# Patient Record
Sex: Male | Born: 1945
Health system: Southern US, Community
[De-identification: ages and names within clinical notes are randomized; demographics above are authoritative.]

## PROBLEM LIST (undated history)

## (undated) DIAGNOSIS — G4733 Obstructive sleep apnea (adult) (pediatric): Secondary | ICD-10-CM

## (undated) DIAGNOSIS — D696 Thrombocytopenia, unspecified: Secondary | ICD-10-CM

## (undated) DIAGNOSIS — K5730 Diverticulosis of large intestine without perforation or abscess without bleeding: Secondary | ICD-10-CM

## (undated) DIAGNOSIS — I10 Essential (primary) hypertension: Secondary | ICD-10-CM

## (undated) DIAGNOSIS — Z9989 Dependence on other enabling machines and devices: Secondary | ICD-10-CM

## (undated) DIAGNOSIS — N529 Male erectile dysfunction, unspecified: Secondary | ICD-10-CM

## (undated) DIAGNOSIS — I4891 Unspecified atrial fibrillation: Secondary | ICD-10-CM

## (undated) DIAGNOSIS — M109 Gout, unspecified: Secondary | ICD-10-CM

## (undated) DIAGNOSIS — N183 Chronic kidney disease, stage 3 unspecified: Secondary | ICD-10-CM

## (undated) DIAGNOSIS — Z905 Acquired absence of kidney: Secondary | ICD-10-CM

## (undated) DIAGNOSIS — C44519 Basal cell carcinoma of skin of other part of trunk: Secondary | ICD-10-CM

## (undated) DIAGNOSIS — I2510 Atherosclerotic heart disease of native coronary artery without angina pectoris: Secondary | ICD-10-CM

## (undated) DIAGNOSIS — I420 Dilated cardiomyopathy: Secondary | ICD-10-CM

## (undated) HISTORY — DX: Male erectile dysfunction, unspecified: N52.9

## (undated) HISTORY — PX: APPENDECTOMY: SHX54

## (undated) HISTORY — DX: Chronic kidney disease, stage 3 (moderate): N18.3

## (undated) HISTORY — DX: Chronic kidney disease, stage 3 unspecified: N18.30

## (undated) HISTORY — PX: CYST EXCISION: SHX5701

## (undated) HISTORY — DX: Diverticulosis of large intestine without perforation or abscess without bleeding: K57.30

## (undated) HISTORY — DX: Obstructive sleep apnea (adult) (pediatric): G47.33

## (undated) HISTORY — DX: Gout, unspecified: M10.9

## (undated) HISTORY — DX: Dependence on other enabling machines and devices: Z99.89

## (undated) HISTORY — DX: Essential (primary) hypertension: I10

## (undated) HISTORY — DX: Basal cell carcinoma of skin of other part of trunk: C44.519

## (undated) HISTORY — DX: Thrombocytopenia, unspecified: D69.6

## (undated) HISTORY — DX: Acquired absence of kidney: Z90.5

---
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
## 1987-02-24 HISTORY — PX: VASECTOMY: SHX75

## 2012-06-08 DIAGNOSIS — G4733 Obstructive sleep apnea (adult) (pediatric): Secondary | ICD-10-CM | POA: Diagnosis not present

## 2012-06-14 DIAGNOSIS — M25579 Pain in unspecified ankle and joints of unspecified foot: Secondary | ICD-10-CM | POA: Diagnosis not present

## 2012-06-29 DIAGNOSIS — Z125 Encounter for screening for malignant neoplasm of prostate: Secondary | ICD-10-CM | POA: Diagnosis not present

## 2012-06-29 DIAGNOSIS — Z23 Encounter for immunization: Secondary | ICD-10-CM | POA: Diagnosis not present

## 2012-06-29 DIAGNOSIS — Z136 Encounter for screening for cardiovascular disorders: Secondary | ICD-10-CM | POA: Diagnosis not present

## 2012-06-29 DIAGNOSIS — Z0000 Encounter for general adult medical examination without abnormal findings: Secondary | ICD-10-CM | POA: Diagnosis not present

## 2012-06-29 DIAGNOSIS — R635 Abnormal weight gain: Secondary | ICD-10-CM | POA: Diagnosis not present

## 2012-06-29 DIAGNOSIS — R5381 Other malaise: Secondary | ICD-10-CM | POA: Diagnosis not present

## 2012-07-05 DIAGNOSIS — M25579 Pain in unspecified ankle and joints of unspecified foot: Secondary | ICD-10-CM | POA: Diagnosis not present

## 2012-10-31 DIAGNOSIS — L909 Atrophic disorder of skin, unspecified: Secondary | ICD-10-CM | POA: Diagnosis not present

## 2012-10-31 DIAGNOSIS — D1801 Hemangioma of skin and subcutaneous tissue: Secondary | ICD-10-CM | POA: Diagnosis not present

## 2012-12-15 DIAGNOSIS — Z23 Encounter for immunization: Secondary | ICD-10-CM | POA: Diagnosis not present

## 2013-04-06 DIAGNOSIS — Z85828 Personal history of other malignant neoplasm of skin: Secondary | ICD-10-CM | POA: Diagnosis not present

## 2013-04-06 DIAGNOSIS — D485 Neoplasm of uncertain behavior of skin: Secondary | ICD-10-CM | POA: Diagnosis not present

## 2013-04-06 DIAGNOSIS — B079 Viral wart, unspecified: Secondary | ICD-10-CM | POA: Diagnosis not present

## 2013-04-06 DIAGNOSIS — L919 Hypertrophic disorder of the skin, unspecified: Secondary | ICD-10-CM | POA: Diagnosis not present

## 2013-04-06 DIAGNOSIS — L909 Atrophic disorder of skin, unspecified: Secondary | ICD-10-CM | POA: Diagnosis not present

## 2013-04-06 DIAGNOSIS — B359 Dermatophytosis, unspecified: Secondary | ICD-10-CM | POA: Diagnosis not present

## 2013-10-25 DIAGNOSIS — Z6830 Body mass index (BMI) 30.0-30.9, adult: Secondary | ICD-10-CM | POA: Diagnosis not present

## 2013-10-25 DIAGNOSIS — G4730 Sleep apnea, unspecified: Secondary | ICD-10-CM | POA: Diagnosis not present

## 2013-10-25 DIAGNOSIS — F329 Major depressive disorder, single episode, unspecified: Secondary | ICD-10-CM | POA: Diagnosis not present

## 2013-10-25 DIAGNOSIS — E669 Obesity, unspecified: Secondary | ICD-10-CM | POA: Diagnosis not present

## 2013-10-25 DIAGNOSIS — F3289 Other specified depressive episodes: Secondary | ICD-10-CM | POA: Diagnosis not present

## 2013-10-25 DIAGNOSIS — M109 Gout, unspecified: Secondary | ICD-10-CM | POA: Diagnosis not present

## 2013-11-15 DIAGNOSIS — C44519 Basal cell carcinoma of skin of other part of trunk: Secondary | ICD-10-CM | POA: Diagnosis not present

## 2013-11-15 DIAGNOSIS — B078 Other viral warts: Secondary | ICD-10-CM | POA: Diagnosis not present

## 2013-11-15 DIAGNOSIS — D1801 Hemangioma of skin and subcutaneous tissue: Secondary | ICD-10-CM | POA: Diagnosis not present

## 2013-11-15 DIAGNOSIS — B353 Tinea pedis: Secondary | ICD-10-CM | POA: Diagnosis not present

## 2013-11-15 DIAGNOSIS — L570 Actinic keratosis: Secondary | ICD-10-CM | POA: Diagnosis not present

## 2013-11-15 DIAGNOSIS — L819 Disorder of pigmentation, unspecified: Secondary | ICD-10-CM | POA: Diagnosis not present

## 2013-11-15 DIAGNOSIS — D235 Other benign neoplasm of skin of trunk: Secondary | ICD-10-CM | POA: Diagnosis not present

## 2013-11-15 DIAGNOSIS — B351 Tinea unguium: Secondary | ICD-10-CM | POA: Diagnosis not present

## 2013-11-15 DIAGNOSIS — L821 Other seborrheic keratosis: Secondary | ICD-10-CM | POA: Diagnosis not present

## 2013-11-15 DIAGNOSIS — Z23 Encounter for immunization: Secondary | ICD-10-CM | POA: Diagnosis not present

## 2014-02-01 DIAGNOSIS — G4733 Obstructive sleep apnea (adult) (pediatric): Secondary | ICD-10-CM | POA: Diagnosis not present

## 2014-03-21 DIAGNOSIS — E669 Obesity, unspecified: Secondary | ICD-10-CM | POA: Diagnosis not present

## 2014-03-21 DIAGNOSIS — Z23 Encounter for immunization: Secondary | ICD-10-CM | POA: Diagnosis not present

## 2014-03-21 DIAGNOSIS — Z6831 Body mass index (BMI) 31.0-31.9, adult: Secondary | ICD-10-CM | POA: Diagnosis not present

## 2014-03-21 DIAGNOSIS — Z136 Encounter for screening for cardiovascular disorders: Secondary | ICD-10-CM | POA: Diagnosis not present

## 2014-03-21 DIAGNOSIS — Z79899 Other long term (current) drug therapy: Secondary | ICD-10-CM | POA: Diagnosis not present

## 2014-03-21 DIAGNOSIS — Z1389 Encounter for screening for other disorder: Secondary | ICD-10-CM | POA: Diagnosis not present

## 2014-03-21 DIAGNOSIS — G4733 Obstructive sleep apnea (adult) (pediatric): Secondary | ICD-10-CM | POA: Diagnosis not present

## 2014-03-21 DIAGNOSIS — Z0000 Encounter for general adult medical examination without abnormal findings: Secondary | ICD-10-CM | POA: Diagnosis not present

## 2014-03-21 DIAGNOSIS — H259 Unspecified age-related cataract: Secondary | ICD-10-CM | POA: Diagnosis not present

## 2014-03-21 DIAGNOSIS — M109 Gout, unspecified: Secondary | ICD-10-CM | POA: Diagnosis not present

## 2014-03-21 DIAGNOSIS — Z125 Encounter for screening for malignant neoplasm of prostate: Secondary | ICD-10-CM | POA: Diagnosis not present

## 2014-03-21 DIAGNOSIS — F325 Major depressive disorder, single episode, in full remission: Secondary | ICD-10-CM | POA: Diagnosis not present

## 2014-03-21 DIAGNOSIS — R0609 Other forms of dyspnea: Secondary | ICD-10-CM | POA: Diagnosis not present

## 2014-03-21 DIAGNOSIS — H9190 Unspecified hearing loss, unspecified ear: Secondary | ICD-10-CM | POA: Diagnosis not present

## 2014-03-28 DIAGNOSIS — H903 Sensorineural hearing loss, bilateral: Secondary | ICD-10-CM | POA: Diagnosis not present

## 2014-11-02 DIAGNOSIS — Z23 Encounter for immunization: Secondary | ICD-10-CM | POA: Diagnosis not present

## 2014-11-21 DIAGNOSIS — L814 Other melanin hyperpigmentation: Secondary | ICD-10-CM | POA: Diagnosis not present

## 2014-11-21 DIAGNOSIS — D2262 Melanocytic nevi of left upper limb, including shoulder: Secondary | ICD-10-CM | POA: Diagnosis not present

## 2014-11-21 DIAGNOSIS — D2261 Melanocytic nevi of right upper limb, including shoulder: Secondary | ICD-10-CM | POA: Diagnosis not present

## 2014-11-21 DIAGNOSIS — C44519 Basal cell carcinoma of skin of other part of trunk: Secondary | ICD-10-CM | POA: Diagnosis not present

## 2014-11-21 DIAGNOSIS — D1801 Hemangioma of skin and subcutaneous tissue: Secondary | ICD-10-CM | POA: Diagnosis not present

## 2014-11-21 DIAGNOSIS — Z85828 Personal history of other malignant neoplasm of skin: Secondary | ICD-10-CM | POA: Diagnosis not present

## 2014-11-21 DIAGNOSIS — D2362 Other benign neoplasm of skin of left upper limb, including shoulder: Secondary | ICD-10-CM | POA: Diagnosis not present

## 2014-11-21 DIAGNOSIS — L821 Other seborrheic keratosis: Secondary | ICD-10-CM | POA: Diagnosis not present

## 2015-03-21 DIAGNOSIS — J209 Acute bronchitis, unspecified: Secondary | ICD-10-CM | POA: Diagnosis not present

## 2015-04-30 DIAGNOSIS — K5730 Diverticulosis of large intestine without perforation or abscess without bleeding: Secondary | ICD-10-CM | POA: Diagnosis not present

## 2015-04-30 DIAGNOSIS — D120 Benign neoplasm of cecum: Secondary | ICD-10-CM | POA: Diagnosis not present

## 2015-04-30 DIAGNOSIS — Z86010 Personal history of colonic polyps: Secondary | ICD-10-CM | POA: Diagnosis not present

## 2015-05-22 DIAGNOSIS — M109 Gout, unspecified: Secondary | ICD-10-CM | POA: Diagnosis not present

## 2015-05-22 DIAGNOSIS — E669 Obesity, unspecified: Secondary | ICD-10-CM | POA: Diagnosis not present

## 2015-05-22 DIAGNOSIS — D696 Thrombocytopenia, unspecified: Secondary | ICD-10-CM | POA: Diagnosis not present

## 2015-05-22 DIAGNOSIS — Z6832 Body mass index (BMI) 32.0-32.9, adult: Secondary | ICD-10-CM | POA: Diagnosis not present

## 2015-05-22 DIAGNOSIS — N183 Chronic kidney disease, stage 3 (moderate): Secondary | ICD-10-CM | POA: Diagnosis not present

## 2015-05-22 DIAGNOSIS — Z79899 Other long term (current) drug therapy: Secondary | ICD-10-CM | POA: Diagnosis not present

## 2015-05-22 DIAGNOSIS — F325 Major depressive disorder, single episode, in full remission: Secondary | ICD-10-CM | POA: Diagnosis not present

## 2015-05-22 DIAGNOSIS — G4733 Obstructive sleep apnea (adult) (pediatric): Secondary | ICD-10-CM | POA: Diagnosis not present

## 2015-05-22 DIAGNOSIS — Z0000 Encounter for general adult medical examination without abnormal findings: Secondary | ICD-10-CM | POA: Diagnosis not present

## 2015-05-23 DIAGNOSIS — F329 Major depressive disorder, single episode, unspecified: Secondary | ICD-10-CM | POA: Diagnosis not present

## 2015-05-23 DIAGNOSIS — G4733 Obstructive sleep apnea (adult) (pediatric): Secondary | ICD-10-CM | POA: Diagnosis not present

## 2015-09-11 DIAGNOSIS — G4733 Obstructive sleep apnea (adult) (pediatric): Secondary | ICD-10-CM | POA: Diagnosis not present

## 2015-09-11 DIAGNOSIS — Z9989 Dependence on other enabling machines and devices: Secondary | ICD-10-CM | POA: Diagnosis not present

## 2015-12-13 DIAGNOSIS — Z85828 Personal history of other malignant neoplasm of skin: Secondary | ICD-10-CM | POA: Diagnosis not present

## 2015-12-13 DIAGNOSIS — D2272 Melanocytic nevi of left lower limb, including hip: Secondary | ICD-10-CM | POA: Diagnosis not present

## 2015-12-13 DIAGNOSIS — D2261 Melanocytic nevi of right upper limb, including shoulder: Secondary | ICD-10-CM | POA: Diagnosis not present

## 2015-12-13 DIAGNOSIS — D485 Neoplasm of uncertain behavior of skin: Secondary | ICD-10-CM | POA: Diagnosis not present

## 2015-12-13 DIAGNOSIS — B078 Other viral warts: Secondary | ICD-10-CM | POA: Diagnosis not present

## 2015-12-13 DIAGNOSIS — L814 Other melanin hyperpigmentation: Secondary | ICD-10-CM | POA: Diagnosis not present

## 2015-12-13 DIAGNOSIS — D2262 Melanocytic nevi of left upper limb, including shoulder: Secondary | ICD-10-CM | POA: Diagnosis not present

## 2015-12-13 DIAGNOSIS — D225 Melanocytic nevi of trunk: Secondary | ICD-10-CM | POA: Diagnosis not present

## 2015-12-13 DIAGNOSIS — L821 Other seborrheic keratosis: Secondary | ICD-10-CM | POA: Diagnosis not present

## 2015-12-13 DIAGNOSIS — D1801 Hemangioma of skin and subcutaneous tissue: Secondary | ICD-10-CM | POA: Diagnosis not present

## 2015-12-13 DIAGNOSIS — B353 Tinea pedis: Secondary | ICD-10-CM | POA: Diagnosis not present

## 2015-12-13 DIAGNOSIS — D2271 Melanocytic nevi of right lower limb, including hip: Secondary | ICD-10-CM | POA: Diagnosis not present

## 2016-01-08 ENCOUNTER — Ambulatory Visit (INDEPENDENT_AMBULATORY_CARE_PROVIDER_SITE_OTHER): Payer: Medicare Other | Admitting: Podiatry

## 2016-01-08 ENCOUNTER — Encounter: Payer: Self-pay | Admitting: Podiatry

## 2016-01-08 VITALS — BP 148/91 | HR 60 | Ht 71.0 in | Wt 229.0 lb

## 2016-01-08 DIAGNOSIS — L603 Nail dystrophy: Secondary | ICD-10-CM

## 2016-01-08 DIAGNOSIS — L608 Other nail disorders: Secondary | ICD-10-CM

## 2016-01-08 DIAGNOSIS — B351 Tinea unguium: Secondary | ICD-10-CM | POA: Diagnosis not present

## 2016-01-08 DIAGNOSIS — M79609 Pain in unspecified limb: Secondary | ICD-10-CM | POA: Diagnosis not present

## 2016-01-08 DIAGNOSIS — L601 Onycholysis: Secondary | ICD-10-CM | POA: Diagnosis not present

## 2016-01-08 DIAGNOSIS — Z23 Encounter for immunization: Secondary | ICD-10-CM | POA: Diagnosis not present

## 2016-01-19 NOTE — Progress Notes (Signed)
Subjective: Patient presents today for possible treatment and evaluation of fungal nails bilaterally 1 through 5. Patient states that the nails have been discolored and thickened for greater than 1 month. Patient presents today for further treatment and evaluation.  Objective: Physical Exam General: The patient is alert and oriented x3 in no acute distress.  Dermatology: Hyperkeratotic, discolored, thickened, onychodystrophy of nails noted bilaterally.  Skin is warm, dry and supple bilateral lower extremities. Negative for open lesions or macerations.  Vascular: Palpable pedal pulses bilaterally. No edema or erythema noted. Capillary refill within normal limits.  Neurological: Epicritic and protective threshold grossly intact bilaterally.   Musculoskeletal Exam: Range of motion within normal limits to all pedal and ankle joints bilateral. Muscle strength 5/5 in all groups bilateral.   Assessment: #1 onychodystrophy bilateral toenails #2 possible onychomycosis #3 hyperkeratotic nails bilateral  Plan of Care:  #1 Patient was evaluated. #2 Today nail biopsy was taken and sent to pathology for fungal culture. #5 patient is to return to clinic in 4 weeks to discuss fungal culture nail biopsy findings and discuss different treatment options.  Edrick Kins, DPM [facility]  Dr. Edrick Kins, [address]                                        [place], [place] [address]                Office [phone]  Fax [phone]

## 2016-02-03 DIAGNOSIS — L859 Epidermal thickening, unspecified: Secondary | ICD-10-CM | POA: Diagnosis not present

## 2016-02-03 DIAGNOSIS — L820 Inflamed seborrheic keratosis: Secondary | ICD-10-CM | POA: Diagnosis not present

## 2016-02-03 DIAGNOSIS — Z85828 Personal history of other malignant neoplasm of skin: Secondary | ICD-10-CM | POA: Diagnosis not present

## 2016-02-03 DIAGNOSIS — B078 Other viral warts: Secondary | ICD-10-CM | POA: Diagnosis not present

## 2016-02-12 ENCOUNTER — Ambulatory Visit (INDEPENDENT_AMBULATORY_CARE_PROVIDER_SITE_OTHER): Payer: Medicare Other | Admitting: Podiatry

## 2016-02-12 ENCOUNTER — Encounter: Payer: Self-pay | Admitting: Podiatry

## 2016-02-12 DIAGNOSIS — M79676 Pain in unspecified toe(s): Secondary | ICD-10-CM | POA: Diagnosis not present

## 2016-02-12 DIAGNOSIS — L03039 Cellulitis of unspecified toe: Secondary | ICD-10-CM

## 2016-02-12 DIAGNOSIS — Z79899 Other long term (current) drug therapy: Secondary | ICD-10-CM | POA: Diagnosis not present

## 2016-02-12 DIAGNOSIS — L603 Nail dystrophy: Secondary | ICD-10-CM | POA: Diagnosis not present

## 2016-02-12 DIAGNOSIS — L608 Other nail disorders: Secondary | ICD-10-CM

## 2016-02-12 DIAGNOSIS — L600 Ingrowing nail: Secondary | ICD-10-CM | POA: Diagnosis not present

## 2016-02-12 DIAGNOSIS — B351 Tinea unguium: Secondary | ICD-10-CM

## 2016-02-12 DIAGNOSIS — M79609 Pain in unspecified limb: Secondary | ICD-10-CM

## 2016-02-12 MED ORDER — TERBINAFINE HCL 250 MG PO TABS: 250.0000 mg | ORAL_TABLET | Freq: Every day | ORAL | 2 refills | Status: DC

## 2016-02-12 NOTE — Patient Instructions (Signed)

## 2016-02-12 NOTE — Progress Notes (Signed)
Subjective: Patient presents today for follow-up evaluation of onychomycosis to the bilateral toenails. Last visit nail biopsy was performed and sent for fungal culture. Patient presents today to review fungal culture results.  Patient also presents today with a new complaint for evaluation of pain in her toe(s). Patient is concerned for possible ingrown nail. Patient states that the pain has been present for a few weeks now. Patient presents today for further treatment and evaluation.  Objective:  General: Well developed, nourished, in no acute distress, alert and oriented x3   Dermatology: Skin is warm, dry and supple bilateral. Lateral border of the left great toe appears to be erythematous with evidence of an ingrowing nail. Pain on palpation noted to the border of the nail fold. The remaining nails appear unremarkable at this time. There are no open sores, lesions.  Vascular: Dorsalis Pedis artery and Posterior Tibial artery pedal pulses palpable. No lower extremity edema noted.   Neruologic: Grossly intact via light touch bilateral.  Musculoskeletal: Muscular strength within normal limits in all groups bilateral. Normal range of motion noted to all pedal and ankle joints.   Assesement: #1 onychomycosis bilateral toenails confirmed by fungal nail culture #2 paronychia with ingrowing nail left great toe lateral border #3 pain in left great toe   Plan of Care:  1. Patient evaluated.  2. Discussed treatment alternatives and plan of care. Explained nail avulsion procedure and post procedure course to patient. 3. Patient opted for permanent partial nail avulsion.  4. Prior to procedure, local anesthesia infiltration utilized using 3 ml of a 50:50 mixture of 2% plain lidocaine and 0.5% plain marcaine in a normal hallux Storck fashion and a betadine prep performed.  5. Partial permanent nail avulsion with chemical matrixectomy performed using [id number] applications of phenol followed by alcohol  flush.  6. Light dressing applied. 7. Today fungal nail culture was reviewed which was positive for onychomycosis  8. Prescription for terbinafine 250 mg 90 days  9. Prescription for anti-fungal nail lacquer dispensed through [facility]  10. Follow-up in 4 weeks with Marylou Mccoy for fungal nail laser treatment  11. Liver function tests ordered  12. Return to clinic in 4 months.   Edrick Kins, DPM [facility]  Dr. Edrick Kins, [address]                                        [place], [place] [address]                Office [phone]  Fax [phone]

## 2016-02-13 LAB — HEPATIC FUNCTION PANEL
ALT: 20 U/L (ref 9–46)
AST: 21 U/L (ref 10–35)
Albumin: 4.6 g/dL (ref 3.6–5.1)
Alkaline Phosphatase: 63 U/L (ref 40–115)
Bilirubin, Direct: 0.2 mg/dL (ref <=0.2)
Indirect Bilirubin: 0.7 mg/dL (ref 0.2–1.2)
Total Bilirubin: 0.9 mg/dL (ref 0.2–1.2)
Total Protein: 7 g/dL (ref 6.1–8.1)

## 2016-02-13 MED ORDER — NONFORMULARY OR COMPOUNDED ITEM: 1.0000 [drp] | Freq: Every day | 2 refills | Status: DC

## 2016-02-13 NOTE — Addendum Note (Signed)
Addended by: Johnnye Lana A on: 02/13/2016 08:31 AM   Modules accepted: Orders

## 2016-02-24 DIAGNOSIS — I10 Essential (primary) hypertension: Secondary | ICD-10-CM

## 2016-02-24 HISTORY — DX: Essential (primary) hypertension: I10

## 2016-03-18 ENCOUNTER — Ambulatory Visit: Payer: Medicare Other

## 2016-03-18 DIAGNOSIS — B351 Tinea unguium: Secondary | ICD-10-CM

## 2016-03-18 NOTE — Progress Notes (Signed)
Pt presents with mycotic infection of nails 1, 2, 3 Rt foot  All other systems are negative  Laser therapy administered to affected nails and tolerated well. All safety precautions were in place. Re-appointed in 1 month for 2nd treatment

## 2016-04-21 ENCOUNTER — Ambulatory Visit: Payer: Self-pay

## 2016-04-21 DIAGNOSIS — B351 Tinea unguium: Secondary | ICD-10-CM

## 2016-04-24 NOTE — Progress Notes (Signed)
Pt presents with mycotic infection of nails 1, 2, 3 Rt foot with 50% clearance  All other systems are negative  Laser therapy administered to affected nails and tolerated well. All safety precautions were in place. Re-appointed in 1 month for 3rd treatment

## 2016-05-21 ENCOUNTER — Ambulatory Visit: Payer: Medicare Other

## 2016-05-21 DIAGNOSIS — B351 Tinea unguium: Secondary | ICD-10-CM

## 2016-05-26 DIAGNOSIS — G4733 Obstructive sleep apnea (adult) (pediatric): Secondary | ICD-10-CM | POA: Diagnosis not present

## 2016-05-26 DIAGNOSIS — Z0000 Encounter for general adult medical examination without abnormal findings: Secondary | ICD-10-CM | POA: Diagnosis not present

## 2016-05-26 DIAGNOSIS — F325 Major depressive disorder, single episode, in full remission: Secondary | ICD-10-CM | POA: Diagnosis not present

## 2016-05-26 DIAGNOSIS — R030 Elevated blood-pressure reading, without diagnosis of hypertension: Secondary | ICD-10-CM | POA: Diagnosis not present

## 2016-05-26 DIAGNOSIS — Z79899 Other long term (current) drug therapy: Secondary | ICD-10-CM | POA: Diagnosis not present

## 2016-05-26 DIAGNOSIS — N183 Chronic kidney disease, stage 3 (moderate): Secondary | ICD-10-CM | POA: Diagnosis not present

## 2016-05-26 DIAGNOSIS — R739 Hyperglycemia, unspecified: Secondary | ICD-10-CM | POA: Diagnosis not present

## 2016-05-26 DIAGNOSIS — Z125 Encounter for screening for malignant neoplasm of prostate: Secondary | ICD-10-CM | POA: Diagnosis not present

## 2016-05-26 DIAGNOSIS — D696 Thrombocytopenia, unspecified: Secondary | ICD-10-CM | POA: Diagnosis not present

## 2016-05-26 DIAGNOSIS — M109 Gout, unspecified: Secondary | ICD-10-CM | POA: Diagnosis not present

## 2016-05-28 NOTE — Progress Notes (Signed)
Pt presents with mycotic infection of nails 1, 2, 3 Rt foot with 50% clearance  All other systems are negative  Laser therapy administered to affected nails and tolerated well. All safety precautions were in place. Re-appointed in 1 month for 4th  treatment

## 2016-06-18 ENCOUNTER — Ambulatory Visit: Payer: Medicare Other

## 2016-06-18 DIAGNOSIS — B351 Tinea unguium: Secondary | ICD-10-CM

## 2016-06-18 DIAGNOSIS — R52 Pain, unspecified: Secondary | ICD-10-CM

## 2016-06-23 NOTE — Progress Notes (Addendum)
Pt presents with mycotic infection of nails 1, 2, 3 Rt foot with 50% clearance  All other systems are negative  Laser therapy administered to affected nails and tolerated well. All safety precautions were in place. Follow up prn

## 2016-11-03 DIAGNOSIS — H527 Unspecified disorder of refraction: Secondary | ICD-10-CM | POA: Diagnosis not present

## 2016-11-03 DIAGNOSIS — H25813 Combined forms of age-related cataract, bilateral: Secondary | ICD-10-CM | POA: Diagnosis not present

## 2016-11-05 DIAGNOSIS — Z23 Encounter for immunization: Secondary | ICD-10-CM | POA: Diagnosis not present

## 2016-12-16 DIAGNOSIS — D1801 Hemangioma of skin and subcutaneous tissue: Secondary | ICD-10-CM | POA: Diagnosis not present

## 2016-12-16 DIAGNOSIS — D2271 Melanocytic nevi of right lower limb, including hip: Secondary | ICD-10-CM | POA: Diagnosis not present

## 2016-12-16 DIAGNOSIS — Z85828 Personal history of other malignant neoplasm of skin: Secondary | ICD-10-CM | POA: Diagnosis not present

## 2016-12-16 DIAGNOSIS — D224 Melanocytic nevi of scalp and neck: Secondary | ICD-10-CM | POA: Diagnosis not present

## 2016-12-16 DIAGNOSIS — L918 Other hypertrophic disorders of the skin: Secondary | ICD-10-CM | POA: Diagnosis not present

## 2016-12-16 DIAGNOSIS — D2262 Melanocytic nevi of left upper limb, including shoulder: Secondary | ICD-10-CM | POA: Diagnosis not present

## 2016-12-16 DIAGNOSIS — L820 Inflamed seborrheic keratosis: Secondary | ICD-10-CM | POA: Diagnosis not present

## 2016-12-16 DIAGNOSIS — D225 Melanocytic nevi of trunk: Secondary | ICD-10-CM | POA: Diagnosis not present

## 2016-12-16 DIAGNOSIS — D2261 Melanocytic nevi of right upper limb, including shoulder: Secondary | ICD-10-CM | POA: Diagnosis not present

## 2016-12-16 DIAGNOSIS — L821 Other seborrheic keratosis: Secondary | ICD-10-CM | POA: Diagnosis not present

## 2016-12-16 DIAGNOSIS — L814 Other melanin hyperpigmentation: Secondary | ICD-10-CM | POA: Diagnosis not present

## 2017-04-07 DIAGNOSIS — G4733 Obstructive sleep apnea (adult) (pediatric): Secondary | ICD-10-CM | POA: Diagnosis not present

## 2017-04-07 DIAGNOSIS — Z9989 Dependence on other enabling machines and devices: Secondary | ICD-10-CM | POA: Diagnosis not present

## 2017-06-02 DIAGNOSIS — M109 Gout, unspecified: Secondary | ICD-10-CM | POA: Diagnosis not present

## 2017-06-02 DIAGNOSIS — Z6833 Body mass index (BMI) 33.0-33.9, adult: Secondary | ICD-10-CM | POA: Diagnosis not present

## 2017-06-02 DIAGNOSIS — F325 Major depressive disorder, single episode, in full remission: Secondary | ICD-10-CM | POA: Diagnosis not present

## 2017-06-02 DIAGNOSIS — R030 Elevated blood-pressure reading, without diagnosis of hypertension: Secondary | ICD-10-CM | POA: Diagnosis not present

## 2017-06-02 DIAGNOSIS — Z0000 Encounter for general adult medical examination without abnormal findings: Secondary | ICD-10-CM | POA: Diagnosis not present

## 2017-06-02 DIAGNOSIS — E669 Obesity, unspecified: Secondary | ICD-10-CM | POA: Diagnosis not present

## 2017-06-02 DIAGNOSIS — Z125 Encounter for screening for malignant neoplasm of prostate: Secondary | ICD-10-CM | POA: Diagnosis not present

## 2017-06-02 DIAGNOSIS — Z1389 Encounter for screening for other disorder: Secondary | ICD-10-CM | POA: Diagnosis not present

## 2017-06-02 DIAGNOSIS — Z79899 Other long term (current) drug therapy: Secondary | ICD-10-CM | POA: Diagnosis not present

## 2017-06-02 DIAGNOSIS — N183 Chronic kidney disease, stage 3 (moderate): Secondary | ICD-10-CM | POA: Diagnosis not present

## 2017-06-02 DIAGNOSIS — I129 Hypertensive chronic kidney disease with stage 1 through stage 4 chronic kidney disease, or unspecified chronic kidney disease: Secondary | ICD-10-CM | POA: Diagnosis not present

## 2017-06-02 DIAGNOSIS — G4733 Obstructive sleep apnea (adult) (pediatric): Secondary | ICD-10-CM | POA: Diagnosis not present

## 2017-08-11 DIAGNOSIS — I129 Hypertensive chronic kidney disease with stage 1 through stage 4 chronic kidney disease, or unspecified chronic kidney disease: Secondary | ICD-10-CM | POA: Diagnosis not present

## 2017-08-11 DIAGNOSIS — M25561 Pain in right knee: Secondary | ICD-10-CM | POA: Diagnosis not present

## 2017-08-11 DIAGNOSIS — N183 Chronic kidney disease, stage 3 (moderate): Secondary | ICD-10-CM | POA: Diagnosis not present

## 2017-09-15 DIAGNOSIS — R05 Cough: Secondary | ICD-10-CM | POA: Diagnosis not present

## 2017-09-15 DIAGNOSIS — J00 Acute nasopharyngitis [common cold]: Secondary | ICD-10-CM | POA: Diagnosis not present

## 2017-09-15 DIAGNOSIS — R0982 Postnasal drip: Secondary | ICD-10-CM | POA: Diagnosis not present

## 2017-10-22 DIAGNOSIS — L729 Follicular cyst of the skin and subcutaneous tissue, unspecified: Secondary | ICD-10-CM | POA: Diagnosis not present

## 2017-10-22 DIAGNOSIS — I129 Hypertensive chronic kidney disease with stage 1 through stage 4 chronic kidney disease, or unspecified chronic kidney disease: Secondary | ICD-10-CM | POA: Diagnosis not present

## 2017-10-22 DIAGNOSIS — N183 Chronic kidney disease, stage 3 (moderate): Secondary | ICD-10-CM | POA: Diagnosis not present

## 2017-10-22 DIAGNOSIS — I480 Paroxysmal atrial fibrillation: Secondary | ICD-10-CM | POA: Diagnosis not present

## 2017-10-22 DIAGNOSIS — M25551 Pain in right hip: Secondary | ICD-10-CM | POA: Diagnosis not present

## 2017-10-22 DIAGNOSIS — I499 Cardiac arrhythmia, unspecified: Secondary | ICD-10-CM | POA: Diagnosis not present

## 2017-10-24 DIAGNOSIS — I420 Dilated cardiomyopathy: Secondary | ICD-10-CM

## 2017-10-24 HISTORY — PX: TRANSTHORACIC ECHOCARDIOGRAM: SHX275

## 2017-10-24 HISTORY — DX: Dilated cardiomyopathy: I42.0

## 2017-10-26 ENCOUNTER — Encounter: Payer: Self-pay | Admitting: Cardiology

## 2017-10-26 ENCOUNTER — Ambulatory Visit: Payer: Self-pay | Admitting: Cardiology

## 2017-10-26 ENCOUNTER — Ambulatory Visit (INDEPENDENT_AMBULATORY_CARE_PROVIDER_SITE_OTHER): Payer: Medicare Other | Admitting: Cardiology

## 2017-10-26 VITALS — BP 142/84 | HR 132 | Ht 70.5 in | Wt 233.0 lb

## 2017-10-26 DIAGNOSIS — I10 Essential (primary) hypertension: Secondary | ICD-10-CM | POA: Diagnosis not present

## 2017-10-26 DIAGNOSIS — Z01818 Encounter for other preprocedural examination: Secondary | ICD-10-CM

## 2017-10-26 DIAGNOSIS — R0609 Other forms of dyspnea: Secondary | ICD-10-CM | POA: Diagnosis not present

## 2017-10-26 DIAGNOSIS — I4891 Unspecified atrial fibrillation: Secondary | ICD-10-CM | POA: Insufficient documentation

## 2017-10-26 MED ORDER — RIVAROXABAN 20 MG PO TABS: 20.0000 mg | ORAL_TABLET | Freq: Every day | ORAL | 6 refills | Status: DC

## 2017-10-26 MED ORDER — METOPROLOL TARTRATE 50 MG PO TABS: 50.0000 mg | ORAL_TABLET | Freq: Once | ORAL | 0 refills | Status: DC

## 2017-10-26 NOTE — Progress Notes (Signed)
PCP: Lajean Manes, MD  Clinic Note: Chief Complaint  Patient presents with  . Dizziness  . Shortness of Breath    HPI: Bruce Yoder is a 72 y.o. male who is being seen today for the evaluation of [place] at the request of [place], Christiane Ha, MD.  Bruce Yoder was seen by Dr. Felipa Eth on August 30 for routine follow-up noting that he was overcoming a recent upper respiratory tract infection but was merely noticing just occasional cough.  He also has a cyst on his right thigh/hip.  As part of his evaluation he was noted to have an irregular irregular rhythm and was found to have A. fib on EKG with a controlled rate..  Recent Hospitalizations: None  Studies Personally Reviewed - (if available, images/films reviewed: From Epic Chart or Care Everywhere)  None  Interval History: Bruce Yoder presents here today for cardiology evaluation mostly because he understands that he was told that he has any irregular heartbeat.  He does not really feel any abnormality to his heart rate but has noted that it may feel like he is going a little faster than usual.  He is noted a little more frequent exertional dyspnea while playing golf.  He goes up and down the hills and and feels short of breath doing that.  Sometimes when he is doing that he will occasionally feel dizzy if he pushes it too much.  He denies any chest tightness or pressure associated with it but does have exertional dyspnea.  No resting dyspnea or orthopnea/PND but does have some mild ankle edema. No syncope or near syncope, TIA or amaurosis fugax symptoms.  No claudication. No melena, hematochezia  hematuria, or epistaxis.  ROS: A comprehensive was performed.  Symptoms in HPI Review of Systems  Constitutional: Negative for malaise/fatigue.  HENT: Positive for congestion (Recovering from recent cold).   Respiratory: Positive for cough (Almost all gone). Negative for sputum production, shortness of breath (Per HPI)  and wheezing.   Gastrointestinal: Negative for constipation and heartburn.  Genitourinary: Negative for frequency.  Musculoskeletal: Positive for joint pain (Right hip pain).  Neurological: Negative for dizziness, focal weakness, weakness and headaches.  Endo/Heme/Allergies: Does not bruise/bleed easily.  Psychiatric/Behavioral: Negative.   All other systems reviewed and are negative.  I have reviewed and (if needed) personally updated the patient's problem list, medications, allergies, past medical and surgical history, social and family history.   Past Medical History:  Diagnosis Date  . Basal cell carcinoma (BCC) of back    Dr. Elvera Lennox  . CKD (chronic kidney disease) stage 3, GFR 30-59 ml/min (HCC)    Unilateral kidney  . Diverticulosis of colon    With polyps (Dr. Amedeo Plenty March 2017, followed by Dr. Glennon Hamilton)  . Erectile dysfunction   . Essential hypertension 2018  . Gout   . OSA on CPAP    [facility]  . Status post nephrectomy 1965  . Thrombocytopenia, unspecified (HCC)    Platelet clumping (pseudothrombocytopenia)    Past Surgical History:  Procedure Laterality Date  . APPENDECTOMY     During childhood  . NEPHRECTOMY Right 1965  . VASECTOMY  1989    Current Meds  Medication Sig  . allopurinol (ZYLOPRIM) 300 MG tablet   . CARTIA XT 180 MG 24 hr capsule Take 1 tablet by mouth daily.  . Multiple Vitamin (MULTIVITAMIN) capsule Take 1 capsule by mouth daily.  . sertraline (ZOLOFT) 100 MG tablet   . tadalafil (CIALIS) 5 MG  tablet take 1 tablet by mouth once daily as directed  . tadalafil (CIALIS) 5 MG tablet Take 1 tablet by mouth as directed.    No Known Allergies  Social History   Tobacco Use  . Smoking status: Never Smoker  . Smokeless tobacco: Never Used  Substance Use Topics  . Alcohol use: Not on file  . Drug use: Not on file   Social History   Social History Narrative   He is married now for 4 years (remarried).  He has 1  child (presumably from previous marriage -70 years old).   He lives with his wife.  He is an avid Chief Executive Officer.   He is a retired Producer, television/film/video for [employer].  (He has a BA in American studies at [employer].)   Never smoked.  Drinks 5-7 alcoholic beverages a week usually beer or hard cider.  May be occasionally a mixed drink.   He is quite active exercise at least 4 days a week for 30 minutes at a time.       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  He enjoys walking on both treadmill and in the community.  He plays golf routinely.    family history is not on file.  Wt Readings from Last 3 Encounters:  10/26/17 233 lb (105.7 kg)  01/08/16 229 lb (103.9 kg)    PHYSICAL EXAM BP (!) 142/84   Pulse (!) 132   Ht 5' 10.5" (1.791 m)   Wt 233 lb (105.7 kg)   BMI 32.96 kg/m  --Actually, on my exam, his heart rate was back down in the 80s. Physical Exam  Constitutional: He is oriented to person, place, and time. He appears well-developed and well-nourished. No distress.  HENT:  Head: Normocephalic and atraumatic.  Eyes: Pupils are equal, round, and reactive to light. Conjunctivae and EOM are normal. No scleral icterus.  Neck: Normal range of motion. Neck supple. No hepatojugular reflux and no JVD present. Carotid bruit is not present. No thyromegaly present.  Cardiovascular: Normal rate, normal heart sounds, intact distal pulses and normal pulses. An irregularly irregular rhythm present. PMI is not displaced. Exam reveals no gallop and no friction rub.  No murmur heard. Pulmonary/Chest: Effort normal and breath sounds normal. No respiratory distress. He has no wheezes. He has no rales.  Abdominal: Soft. Bowel sounds are normal. He exhibits no distension. There is no tenderness. There is no rebound.  No HSM  Musculoskeletal: Normal range of motion. He exhibits no edema.  Neurological:  He is alert and oriented to person, place, and time. No cranial nerve deficit.  Skin: Skin is warm and dry.  Psychiatric: He has a normal mood and affect. His behavior is normal. Judgment and thought content normal.  Vitals reviewed.    Adult ECG Report  Rate: 132 ;  Rhythm: atrial fibrillation and Rapid ventricular rate.  Left atrial enlargement.  Otherwise normal axis, intervals and durations.;   Narrative Interpretation: Abnormal EKG   Other studies Reviewed: Additional studies/ records that were reviewed today include:  Recent Labs: From PCP visit on 10/22/2017:  Sodium 140, potassium 4.3, chloride 107, bicarb 27, BUN 23, creatinine 1.53.  Glucose 117.  Free T4 0.77 with a TSH of 3.12.  ASSESSMENT / PLAN: Problem List Items Addressed This Visit    DOE (dyspnea on exertion) (Chronic)    This may or may not be related to A.  fib since were not sure aeration of his current episode. Ischemic evaluation and structural heart disease evaluation warranted.  Plan: 2D echo, coronary CT angiogram (I would prefer CT angiogram over stress test to avoid having to hold medications for rate control)      Relevant Orders   EKG 12-Lead (Completed)   Basic metabolic panel   CT CORONARY MORPH W/CTA COR W/SCORE W/CA W/CM &/OR WO/CM   CT CORONARY FRACTIONAL FLOW RESERVE DATA PREP   CT CORONARY FRACTIONAL FLOW RESERVE FLUID ANALYSIS   ECHOCARDIOGRAM COMPLETE   HOLTER MONITOR - 73 HOUR   Essential hypertension (Chronic)    Borderline blood pressure having converted from amlodipine to diltiazem.  Probably still has room for additional rate control agent such as beta-blocker versus increasing diltiazem dose.      Relevant Medications   CARTIA XT 180 MG 24 hr capsule   tadalafil (CIALIS) 5 MG tablet   rivaroxaban (XARELTO) 20 MG TABS tablet   New onset atrial fibrillation ([place]):  CHA2DS2-VASc Score (age, HTN) - Primary (Chronic)    New finding with quite labile heart rates.  EKG on evaluation was  rate of 132 having walked into the clinic.  But on my reassessment it was more in the 47s. Dr. Felipa Eth switched him from amlodipine to diltiazem.  I will simply continue this current dose and we can see what his heart rate ranges.  This patients CHA2DS2-VASc Score and unadjusted Ischemic Stroke Rate (% per year) is equal to 2.2 % stroke rate/year from a score of 2  Above score calculated as 1 point each if present [CHF, HTN, DM, Vascular=MI/PAD/Aortic Plaque, Age if 65-74, or Male]; 2 points each if present [Age > 75, or Stroke/TIA/TE]  Plan:  48-hour monitor to determine A. fib burden.  2D echocardiogram.  Ischemic evaluation with coronary CT angiogram (has exertional dyspnea)  Initiate anticoagulation with Xarelto         Relevant Medications   CARTIA XT 180 MG 24 hr capsule   tadalafil (CIALIS) 5 MG tablet   rivaroxaban (XARELTO) 20 MG TABS tablet   Other Relevant Orders   EKG 12-Lead (Completed)   Basic metabolic panel   CT CORONARY MORPH W/CTA COR W/SCORE W/CA W/CM &/OR WO/CM   CT CORONARY FRACTIONAL FLOW RESERVE DATA PREP   CT CORONARY FRACTIONAL FLOW RESERVE FLUID ANALYSIS   ECHOCARDIOGRAM COMPLETE   HOLTER MONITOR - 86 HOUR    Other Visit Diagnoses    Pre-op testing       Relevant Orders   Basic metabolic panel       I spent a total of 45 minutes with the patient and chart review. >  50% of the time was spent in direct patient consultation.   Current medicines are reviewed at length with the patient today.  (+/- concerns) n/a The following changes have been made:  Discussed benefits of anticoagulation.  Patient Instructions  MEDICATIONS INSTRUCTIONS   START XARELTO 20 MG   -- ONE TABLET AT 6 PM  - BIGGEST MEAL OF THE DAY.  CONTINUE  TAKING CARTIA   Studies Ordered:    Orders Placed This Encounter  Procedures  . CT CORONARY MORPH W/CTA COR W/SCORE W/CA W/CM &/OR WO/CM  . CT CORONARY FRACTIONAL FLOW RESERVE DATA PREP  . CT CORONARY FRACTIONAL  FLOW RESERVE FLUID ANALYSIS  . Basic metabolic panel  . HOLTER MONITOR - 48 HOUR  . EKG 12-Lead  . ECHOCARDIOGRAM COMPLETE   follow-up appointment in 2 MONTHS WITH DR  HARDING.    Glenetta Hew, M.D., M.S. Interventional Cardiologist   Pager # [phone] Phone # [phone] [address]. [place], [place] [address]   Thank you for choosing [facility] at [facility]!!

## 2017-10-26 NOTE — Patient Instructions (Addendum)
MEDICATIONS INSTRUCTIONS   START XARELTO 20 MG   -- ONE TABLET AT 6 PM  - BIGGEST MEAL OF THE DAY.  CONTINUE  TAKING CARTIA     SCHEDULE AT [place] [facility] Your physician has requested that you have an echocardiogram. Echocardiography is a painless test that uses sound waves to create images of your heart. It provides your doctor with information about the size and shape of your heart and how well your heart's chambers and valves are working. This procedure takes approximately one hour. There are no restrictions for this procedure. AND Your physician has recommended that you wear a holter monitor 48 HOURS. Holter monitors are medical devices that record the heart's electrical activity. Doctors most often use these monitors to diagnose arrhythmias. Arrhythmias are problems with the speed or rhythm of the heartbeat. The monitor is a small, portable device. You can wear one while you do your normal daily activities. This is usually used to diagnose what is causing palpitations/syncope (passing out).   SCHEDULE AT [place] physician has requested that you have cardiac CT. Cardiac computed tomography (CT) is a painless test that uses an x-ray machine to take clear, detailed pictures of your heart. For further information please visit [facility]. Please follow instruction sheet as given.   Your physician recommends that you schedule a follow-up appointment in 2 [place].  If you need a refill on your cardiac medications before your next appointment, please call your pharmacy.        INSTRUCTIONS FOR  CORONARY CTA    Please arrive at the [facility] main entrance of [facility] at (30-45 minutes prior to test start time)  [facility] [place], [place] [address] [phone]  Proceed to the [facility] Radiology Department (First Floor).  Please follow these instructions carefully (unless  otherwise directed):  PLEASE HAVE LABS - BMP  AT LEAST ONE WEEK PRIOR TO TEST  On the Night Before the Test: . Drink plenty of water. . Do not consume any caffeinated/decaffeinated beverages or chocolate 12 hours prior to your test. . Do not take any antihistamines 12 hours prior to your test.    On the Day of the Test: . Drink plenty of water. Do not drink any water within one hour of the test. . Do not eat any food 4 hours prior to the test. . You may take your regular medications prior to the test. . Take 50 mg of lopressor (metoprolol) one hour before the test.   After the Test: . Drink plenty of water. . After receiving IV contrast, you may experience a mild flushed feeling. This is normal. . On occasion, you may experience a mild rash up to 24 hours after the test. This is not dangerous. If this occurs, you can take Benadryl 25 mg and increase your fluid intake. . If you experience trouble breathing, this can be serious. If it is severe call 911 IMMEDIATELY. If it is mild, please call our office.

## 2017-10-27 ENCOUNTER — Encounter: Payer: Self-pay | Admitting: Cardiology

## 2017-10-27 DIAGNOSIS — I10 Essential (primary) hypertension: Secondary | ICD-10-CM | POA: Insufficient documentation

## 2017-10-27 DIAGNOSIS — R0609 Other forms of dyspnea: Secondary | ICD-10-CM | POA: Insufficient documentation

## 2017-10-27 NOTE — Assessment & Plan Note (Signed)
Borderline blood pressure having converted from amlodipine to diltiazem.  Probably still has room for additional rate control agent such as beta-blocker versus increasing diltiazem dose.

## 2017-10-27 NOTE — Assessment & Plan Note (Signed)
This may or may not be related to A. fib since were not sure aeration of his current episode. Ischemic evaluation and structural heart disease evaluation warranted.  Plan: 2D echo, coronary CT angiogram (I would prefer CT angiogram over stress test to avoid having to hold medications for rate control)

## 2017-10-27 NOTE — Assessment & Plan Note (Addendum)
New finding with quite labile heart rates.  EKG on evaluation was rate of 132 having walked into the clinic.  But on my reassessment it was more in the 5s. Dr. Felipa Eth switched him from amlodipine to diltiazem.  I will simply continue this current dose and we can see what his heart rate ranges.  This patients CHA2DS2-VASc Score and unadjusted Ischemic Stroke Rate (% per year) is equal to 2.2 % stroke rate/year from a score of 2  Above score calculated as 1 point each if present [CHF, HTN, DM, Vascular=MI/PAD/Aortic Plaque, Age if 65-74, or Male]; 2 points each if present [Age > 75, or Stroke/TIA/TE]  Plan:  48-hour monitor to determine A. fib burden.  2D echocardiogram.  Ischemic evaluation with coronary CT angiogram (has exertional dyspnea)  Initiate anticoagulation with Xarelto

## 2017-11-03 ENCOUNTER — Ambulatory Visit (INDEPENDENT_AMBULATORY_CARE_PROVIDER_SITE_OTHER): Payer: Medicare Other

## 2017-11-03 ENCOUNTER — Other Ambulatory Visit: Payer: Self-pay

## 2017-11-03 ENCOUNTER — Ambulatory Visit (HOSPITAL_COMMUNITY): Payer: Medicare Other | Attending: Cardiology

## 2017-11-03 DIAGNOSIS — I083 Combined rheumatic disorders of mitral, aortic and tricuspid valves: Secondary | ICD-10-CM | POA: Diagnosis not present

## 2017-11-03 DIAGNOSIS — I4891 Unspecified atrial fibrillation: Secondary | ICD-10-CM | POA: Diagnosis not present

## 2017-11-03 DIAGNOSIS — R0609 Other forms of dyspnea: Secondary | ICD-10-CM | POA: Diagnosis not present

## 2017-11-05 ENCOUNTER — Telehealth: Payer: Self-pay | Admitting: *Deleted

## 2017-11-05 MED ORDER — METOPROLOL SUCCINATE ER 50 MG PO TB24: 50.0000 mg | ORAL_TABLET | Freq: Every day | ORAL | 6 refills | Status: DC

## 2017-11-05 NOTE — Telephone Encounter (Signed)
-----   Message from Leonie Man, MD sent at 11/03/2017 11:49 PM EDT ----- Echocardiogram results are concerning: Pump function as indicated by the ejection fraction (percentage of blood pumped out from filling volume) is 35 to 40% (normal range is 55 to 65%)  --We are not sure whether this is related to atrial fibrillation or the cause of atrial fibrillation.  At this point, with the absence of any active chest pain symptoms, I think were fine during the coronary CT angiogram to evaluate for blockages, but would have a low threshold to progress to more invasive evaluation to ensure that this is not related to heart artery disease.  At this point, I would like to transition from diltiazem/Cartia to Toprol 50 mg daily. -- Prescribed metoprolol succinate 50 mg p.o. daily, dispense 30 tabs, 12 refill  Glenetta Hew, MD

## 2017-11-05 NOTE — Telephone Encounter (Signed)
Spoke to patient. Result given . Verbalized understanding Patient aware to stop Cartia and start metoprolol succinate 50 mg daily . E-sent new prescription.  patient states he will be taking Holter monitor back to office today.  and to continue with coronary CTA when schedule.

## 2017-11-10 ENCOUNTER — Other Ambulatory Visit (HOSPITAL_COMMUNITY): Payer: Medicare Other

## 2017-11-11 ENCOUNTER — Telehealth: Payer: Self-pay | Admitting: *Deleted

## 2017-11-11 MED ORDER — METOPROLOL SUCCINATE ER 50 MG PO TB24: 50.0000 mg | ORAL_TABLET | Freq: Two times a day (BID) | ORAL | 3 refills | Status: DC

## 2017-11-11 NOTE — Telephone Encounter (Signed)
-----   Message from Leonie Man, MD sent at 11/08/2017 10:52 PM EDT ----- Monitor shows mostly atrial fibrillation with poorly controlled rate.  No other major findings, just simply poor controlled rate.  This would make the diagnosis persistent atrial fibrillation --> will need to work with rate control and consider possible rhythm control.  Until seen in f/u - increase Toprol dose to 100 mg (2 x 50 mg tab) daily.  Glenetta Hew, MD

## 2017-11-11 NOTE — Telephone Encounter (Signed)
Spoke to patient. Result given . Verbalized understanding Aware of increase medication 50 mg twice a day ( toprol xl) E-sent to pharmacy.

## 2017-11-26 DIAGNOSIS — M545 Low back pain: Secondary | ICD-10-CM | POA: Diagnosis not present

## 2017-11-26 DIAGNOSIS — M5416 Radiculopathy, lumbar region: Secondary | ICD-10-CM | POA: Diagnosis not present

## 2017-11-26 DIAGNOSIS — M25551 Pain in right hip: Secondary | ICD-10-CM | POA: Diagnosis not present

## 2017-11-26 DIAGNOSIS — M47816 Spondylosis without myelopathy or radiculopathy, lumbar region: Secondary | ICD-10-CM | POA: Diagnosis not present

## 2017-11-30 DIAGNOSIS — Z01818 Encounter for other preprocedural examination: Secondary | ICD-10-CM | POA: Diagnosis not present

## 2017-11-30 DIAGNOSIS — I4891 Unspecified atrial fibrillation: Secondary | ICD-10-CM | POA: Diagnosis not present

## 2017-11-30 DIAGNOSIS — R0609 Other forms of dyspnea: Secondary | ICD-10-CM | POA: Diagnosis not present

## 2017-12-01 ENCOUNTER — Ambulatory Visit (HOSPITAL_COMMUNITY)
Admission: RE | Admit: 2017-12-01 | Discharge: 2017-12-01 | Disposition: A | Discharge status: Home / Self Care | Payer: Medicare Other | Source: Ambulatory Visit | Attending: Cardiology | Admitting: Cardiology

## 2017-12-01 ENCOUNTER — Telehealth: Payer: Self-pay

## 2017-12-01 ENCOUNTER — Ambulatory Visit (HOSPITAL_COMMUNITY): Payer: Medicare Other

## 2017-12-01 DIAGNOSIS — R0609 Other forms of dyspnea: Secondary | ICD-10-CM

## 2017-12-01 DIAGNOSIS — I4891 Unspecified atrial fibrillation: Secondary | ICD-10-CM

## 2017-12-01 LAB — BASIC METABOLIC PANEL
BUN/Creatinine Ratio: 21 (ref 10–24)
BUN: 33 mg/dL — ABNORMAL HIGH (ref 8–27)
CO2: 20 mmol/L (ref 20–29)
Calcium: 9.4 mg/dL (ref 8.6–10.2)
Chloride: 106 mmol/L (ref 96–106)
Creatinine, Ser: 1.54 mg/dL — ABNORMAL HIGH (ref 0.76–1.27)
GFR calc Af Amer: 52 mL/min/{1.73_m2} — ABNORMAL LOW (ref >59)
GFR calc non Af Amer: 45 mL/min/{1.73_m2} — ABNORMAL LOW (ref >59)
Glucose: 84 mg/dL (ref 65–99)
Potassium: 4.4 mmol/L (ref 3.5–5.2)
Sodium: 144 mmol/L (ref 134–144)

## 2017-12-01 NOTE — Telephone Encounter (Signed)
Received a call from Radiology as pt was scheduled to have a Cardiac CTA today. Was told they were unable to perform test due to pt's HR at 130. Per Nurse pt verbalized he was a little SOB.  Spoke with pt who confirmed that he is feeling SOB. He denies any other symptoms. He verified that he has been taking his Louanna Raw as prescribed. Pt informed that Dr. Ellyn Hack recommends that he been seen by A fib clinic.  Contacted Afib clinic and was able to get pt scheduled for tomorrow 12/02/17 at 1045 am. Attempted to contact pt to inform of appointment. Left message to call back.

## 2017-12-01 NOTE — Progress Notes (Signed)
Pt was here for CT heart scan; HR noted 110-130; MD reading scan notified; will let Dr Ellyn Hack office know; pt will follow-up with MD for further eval/testing

## 2017-12-01 NOTE — Telephone Encounter (Signed)
Pt update with appointment schedule for tomorrow at A fib clinic. Pt also informed that per Dr. Ellyn Hack, if he become increasingly symptomatic before appointment, to go to ED. Pt verbalized understanding.

## 2017-12-02 ENCOUNTER — Encounter (HOSPITAL_COMMUNITY): Payer: Self-pay | Admitting: Nurse Practitioner

## 2017-12-02 ENCOUNTER — Ambulatory Visit (HOSPITAL_COMMUNITY)
Admission: RE | Admit: 2017-12-02 | Discharge: 2017-12-02 | Disposition: A | Discharge status: Home / Self Care | Payer: Medicare Other | Source: Ambulatory Visit | Attending: Nurse Practitioner | Admitting: Nurse Practitioner

## 2017-12-02 VITALS — BP 128/86 | HR 103 | Ht 70.5 in | Wt 230.6 lb

## 2017-12-02 DIAGNOSIS — N183 Chronic kidney disease, stage 3 (moderate): Secondary | ICD-10-CM | POA: Insufficient documentation

## 2017-12-02 DIAGNOSIS — G4733 Obstructive sleep apnea (adult) (pediatric): Secondary | ICD-10-CM | POA: Diagnosis not present

## 2017-12-02 DIAGNOSIS — Z905 Acquired absence of kidney: Secondary | ICD-10-CM | POA: Diagnosis not present

## 2017-12-02 DIAGNOSIS — Z85828 Personal history of other malignant neoplasm of skin: Secondary | ICD-10-CM | POA: Diagnosis not present

## 2017-12-02 DIAGNOSIS — Z79899 Other long term (current) drug therapy: Secondary | ICD-10-CM | POA: Insufficient documentation

## 2017-12-02 DIAGNOSIS — I4819 Other persistent atrial fibrillation: Secondary | ICD-10-CM | POA: Insufficient documentation

## 2017-12-02 DIAGNOSIS — M109 Gout, unspecified: Secondary | ICD-10-CM | POA: Diagnosis not present

## 2017-12-02 DIAGNOSIS — I4891 Unspecified atrial fibrillation: Secondary | ICD-10-CM

## 2017-12-02 DIAGNOSIS — I129 Hypertensive chronic kidney disease with stage 1 through stage 4 chronic kidney disease, or unspecified chronic kidney disease: Secondary | ICD-10-CM | POA: Insufficient documentation

## 2017-12-02 DIAGNOSIS — Z7901 Long term (current) use of anticoagulants: Secondary | ICD-10-CM | POA: Diagnosis not present

## 2017-12-02 MED ORDER — METOPROLOL SUCCINATE ER 50 MG PO TB24: ORAL_TABLET | ORAL | 3 refills | Status: DC

## 2017-12-02 NOTE — Patient Instructions (Signed)
Increase metoprolol to 75mg twice a day 

## 2017-12-02 NOTE — Progress Notes (Signed)
Primary Care Physician: Lajean Manes, MD Referring Physician: Dr. Lewanda Rife France is a 72 y.o. male with a h/o CKD, HTN, new onset of afib with recent  echo showing EF of 35-40%, severe LVH. He was scheduled for cardiac CT  yesterday for further evaluation by Dr. Ellyn Hack but presented with RVR so test could not be done. He was asked to come here for f/u. He reports that he just finished a prednisone taper yesterday for rt hip/knee pain. He is in afib at 103 bpm today. He has noted persistent shortness of breath for around one month but intermittent fatigue and shortness of breath for one year.  He was very active 4-6 weeks ago and now reports that he is now very sedentary 2/2 his shortness of breath. No prior echo to compare. Was changed from  Cardizem to Toprol with reduced EF.Does use CPAP every night. Nightly alcohol intake. No tobacco, or excessive caffeine.weight stable. No PND/orthopnea.  Today, he denies symptoms of palpitations, chest pain, +shortness of breath,  No orthopnea, PND, mild lower extremity edema, dizziness, presyncope, syncope, or neurologic sequela. The patient is tolerating medications without difficulties and is otherwise without complaint today.   Past Medical History:  Diagnosis Date  . Basal cell carcinoma (BCC) of back    Dr. Elvera Lennox  . CKD (chronic kidney disease) stage 3, GFR 30-59 ml/min (HCC)    Unilateral kidney  . Diverticulosis of colon    With polyps (Dr. Amedeo Plenty March 2017, followed by Dr. Glennon Hamilton)  . Erectile dysfunction   . Essential hypertension 2018  . Gout   . OSA on CPAP    [facility]  . Status post nephrectomy 1965  . Thrombocytopenia, unspecified (HCC)    Platelet clumping (pseudothrombocytopenia)   Past Surgical History:  Procedure Laterality Date  . APPENDECTOMY     During childhood  . NEPHRECTOMY Right 1965  . VASECTOMY  1989    Current Outpatient Medications  Medication Sig Dispense Refill  .  allopurinol (ZYLOPRIM) 300 MG tablet     . metoprolol succinate (TOPROL-XL) 50 MG 24 hr tablet Take 1 and 1/2 tablet twice a day (75mg ) 180 tablet 3  . Multiple Vitamin (MULTIVITAMIN) capsule Take 1 capsule by mouth daily.    . rivaroxaban (XARELTO) 20 MG TABS tablet Take 1 tablet (20 mg total) by mouth daily with supper. 30 tablet 6  . sertraline (ZOLOFT) 100 MG tablet     . tadalafil (CIALIS) 5 MG tablet Take 1 tablet by mouth as directed.     No current facility-administered medications for this encounter.     No Known Allergies  Social History   Socioeconomic History  . Marital status: Married    Spouse name: Not on file  . Number of children: Not on file  . Years of education: Not on file  . Highest education level: Not on file  Occupational History  . Not on file  Social Needs  . Financial resource strain: Not on file  . Food insecurity:    Worry: Not on file    Inability: Not on file  . Transportation needs:    Medical: Not on file    Non-medical: Not on file  Tobacco Use  . Smoking status: Never Smoker  . Smokeless tobacco: Never Used  Substance and Sexual Activity  . Alcohol use: Not on file  . Drug use: Not on file  . Sexual activity: Not on file  Lifestyle  . Physical activity:  Days per week: Not on file    Minutes per session: Not on file  . Stress: Not on file  Relationships  . Social connections:    Talks on phone: Not on file    Gets together: Not on file    Attends religious service: Not on file    Active member of club or organization: Not on file    Attends meetings of clubs or organizations: Not on file    Relationship status: Not on file  . Intimate partner violence:    Fear of current or ex partner: Not on file    Emotionally abused: Not on file    Physically abused: Not on file    Forced sexual activity: Not on file  Other Topics Concern  . Not on file  Social History Narrative   He is married now for 4 years (remarried).  He has 1  child (presumably from previous marriage -70 years old).   He lives with his wife.  He is an avid Chief Executive Officer.   He is a retired Producer, television/film/video for [employer].  (He has a BA in American studies at [employer].)   Never smoked.  Drinks 5-7 alcoholic beverages a week usually beer or hard cider.  May be occasionally a mixed drink.   He is quite active exercise at least 4 days a week for 30 minutes at a time.       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  He enjoys walking on both treadmill and in the community.  He plays golf routinely.    No family history on file.  ROS- All systems are reviewed and negative except as per the HPI above  Physical Exam: Vitals:   12/02/17 1059  BP: 128/86  Pulse: (!) 103  SpO2: 97%  Weight: 104.6 kg  Height: 5' 10.5" (1.791 m)   Wt Readings from Last 3 Encounters:  12/02/17 104.6 kg  10/26/17 105.7 kg  01/08/16 103.9 kg    Labs: Lab Results  Component Value Date   NA 144 11/30/2017   K 4.4 11/30/2017   CL 106 11/30/2017   CO2 20 11/30/2017   GLUCOSE 84 11/30/2017   BUN 33 (H) 11/30/2017   CREATININE 1.54 (H) 11/30/2017   CALCIUM 9.4 11/30/2017   No results found for: INR No results found for: CHOL, HDL, LDLCALC, TRIG   GEN- The patient is well appearing, alert and oriented x 3 today.   Head- normocephalic, atraumatic Eyes-  Sclera clear, conjunctiva pink Ears- hearing intact Oropharynx- clear Neck- supple, no JVP Lymph- no cervical lymphadenopathy Lungs- Clear to ausculation bilaterally, normal work of breathing Heart- irregular rate and rhythm, no murmurs, rubs or gallops, PMI not laterally displaced GI- soft, NT, ND, + BS Extremities- no clubbing, cyanosis, or edema MS- no significant deformity or atrophy Skin- no rash or lesion Psych- euthymic mood, full affect Neuro- strength and sensation are intact  EKG- afib at 103  bpm Echo-Study Conclusions  - Left ventricle: The cavity size was normal. Wall thickness was   increased in a pattern of severe LVH. Systolic function was   moderately reduced. The estimated ejection fraction was in the   range of 35% to 40%. - Aortic valve: There was mild regurgitation. - Mitral valve: There was mild regurgitation. - Left atrium: The atrium was mildly dilated. - Right atrium: The atrium was mildly dilated.    Assessment  and Plan: 1. Persistent symptomatic  new onset afib  General education re afib Recent RVR with presentation for cardiac CT but was finishing a prednisone taper, is 103 in office today Will increase Toprol to 50 mg 1 1/2 tab bid for better rate control Return early next week for EKG, BP check If rate looks controlled with have ct rescheduled  2. CHA2DS2VASc score of at least 3 Continue xarelto 20 mg appropriately dosed at crcl cal at 59.86(lone kidney) Has been on drug around 5 weeks per wife, states no known missed doses   3. LV dysfunction Hard to know at this point if EF is reduced 2/2 tachycardia mediated cardiomyopathy or other underlying disease Cardiac CT pending  Cardioversion is probably pending with appropriate  Anticoagulation now on board after CT results are known  Butch Penny C. Collier Monica, [facility] [address] [place], [place] [address] [phone]

## 2017-12-06 ENCOUNTER — Ambulatory Visit (HOSPITAL_COMMUNITY)
Admission: RE | Admit: 2017-12-06 | Discharge: 2017-12-06 | Disposition: A | Discharge status: Home / Self Care | Payer: Medicare Other | Source: Ambulatory Visit | Attending: Nurse Practitioner | Admitting: Nurse Practitioner

## 2017-12-06 ENCOUNTER — Encounter (HOSPITAL_COMMUNITY): Payer: Self-pay | Admitting: Nurse Practitioner

## 2017-12-06 VITALS — BP 124/82 | HR 113 | Ht 70.5 in | Wt 225.0 lb

## 2017-12-06 DIAGNOSIS — I129 Hypertensive chronic kidney disease with stage 1 through stage 4 chronic kidney disease, or unspecified chronic kidney disease: Secondary | ICD-10-CM | POA: Insufficient documentation

## 2017-12-06 DIAGNOSIS — I4891 Unspecified atrial fibrillation: Secondary | ICD-10-CM

## 2017-12-06 DIAGNOSIS — G4733 Obstructive sleep apnea (adult) (pediatric): Secondary | ICD-10-CM | POA: Insufficient documentation

## 2017-12-06 DIAGNOSIS — N183 Chronic kidney disease, stage 3 (moderate): Secondary | ICD-10-CM | POA: Diagnosis not present

## 2017-12-06 DIAGNOSIS — Z85828 Personal history of other malignant neoplasm of skin: Secondary | ICD-10-CM | POA: Diagnosis not present

## 2017-12-06 DIAGNOSIS — Z7901 Long term (current) use of anticoagulants: Secondary | ICD-10-CM | POA: Insufficient documentation

## 2017-12-06 DIAGNOSIS — Z79899 Other long term (current) drug therapy: Secondary | ICD-10-CM | POA: Diagnosis not present

## 2017-12-06 DIAGNOSIS — Z905 Acquired absence of kidney: Secondary | ICD-10-CM | POA: Insufficient documentation

## 2017-12-06 LAB — BASIC METABOLIC PANEL
Anion gap: 6 (ref 5–15)
BUN: 22 mg/dL (ref 8–23)
CO2: 24 mmol/L (ref 22–32)
Calcium: 10 mg/dL (ref 8.9–10.3)
Chloride: 107 mmol/L (ref 98–111)
Creatinine, Ser: 1.45 mg/dL — ABNORMAL HIGH (ref 0.61–1.24)
GFR calc Af Amer: 55 mL/min — ABNORMAL LOW (ref >60)
GFR calc non Af Amer: 47 mL/min — ABNORMAL LOW (ref >60)
Glucose, Bld: 93 mg/dL (ref 70–99)
Potassium: 4.5 mmol/L (ref 3.5–5.1)
Sodium: 137 mmol/L (ref 135–145)

## 2017-12-06 LAB — CBC
HCT: 49.2 % (ref 39.0–52.0)
Hemoglobin: 16.1 g/dL (ref 13.0–17.0)
MCH: 28.4 pg (ref 26.0–34.0)
MCHC: 32.7 g/dL (ref 30.0–36.0)
MCV: 86.9 fL (ref 80.0–100.0)
Platelets: 119 10*3/uL — ABNORMAL LOW (ref 150–400)
RBC: 5.66 MIL/uL (ref 4.22–5.81)
RDW: 14.4 % (ref 11.5–15.5)
WBC: 8.5 10*3/uL (ref 4.0–10.5)
nRBC: 0 % (ref 0.0–0.2)

## 2017-12-06 LAB — TSH: TSH: 3.308 u[IU]/mL (ref 0.350–4.500)

## 2017-12-06 NOTE — Patient Instructions (Signed)
Cardioversion scheduled for Tuesday, October 22nd  - Arrive at the [facility] and go to admitting at 12:30PM  -Do not eat or drink anything after midnight the night prior to your procedure.  - Take all your morning medication with a sip of water prior to arrival.  - You will not be able to drive home after your procedure.

## 2017-12-07 NOTE — Progress Notes (Signed)
Primary Care Physician: Lajean Manes, MD Referring Physician: Dr. Lewanda Rife Bruce Yoder is a 72 y.o. male with a h/o CKD, HTN, new onset of afib with recent  echo showing EF of 35-40%, severe LVH. He was scheduled for cardiac CT  yesterday for further evaluation by Dr. Ellyn Hack but presented with RVR so test could not be done. He was asked to come here for f/u. He reports that he just finished a prednisone taper yesterday for rt hip/knee pain. He is in afib at 103 bpm today. He has noted persistent shortness of breath for around one month but intermittent fatigue and shortness of breath for one year.  He was very active 4-6 weeks ago and now reports that he is now very sedentary 2/2 his shortness of breath. No prior echo to compare. Was changed from  Cardizem to Toprol with reduced EF.Does use CPAP every night. Nightly alcohol intake. No tobacco, or excessive caffeine.weight stable. No PND/orthopnea.  F/u in afib clinic, pt continues in afib, not rate controlled even with increase in BB. Cardiac CT can not be rescheduled unless pt is rate controlled. We discussed going ahead with cardioversion since the pt has been on anticoagulation x 5 weeks, no missed  doses. Hopefully, he will be able to restore SR and be able to complete CT.   Today, he denies symptoms of palpitations, chest pain, +shortness of breath,  No orthopnea, PND, mild lower extremity edema, dizziness, presyncope, syncope, or neurologic sequela. The patient is tolerating medications without difficulties and is otherwise without complaint today.   Past Medical History:  Diagnosis Date  . Basal cell carcinoma (BCC) of back    Dr. Elvera Lennox  . CKD (chronic kidney disease) stage 3, GFR 30-59 ml/min (HCC)    Unilateral kidney  . Diverticulosis of colon    With polyps (Dr. Amedeo Plenty March 2017, followed by Dr. Glennon Hamilton)  . Erectile dysfunction   . Essential hypertension 2018  . Gout   . OSA on CPAP    [facility]  . Status post nephrectomy 1965  . Thrombocytopenia, unspecified (HCC)    Platelet clumping (pseudothrombocytopenia)   Past Surgical History:  Procedure Laterality Date  . APPENDECTOMY     During childhood  . NEPHRECTOMY Right 1965  . VASECTOMY  1989    Current Outpatient Medications  Medication Sig Dispense Refill  . allopurinol (ZYLOPRIM) 300 MG tablet     . metoprolol succinate (TOPROL-XL) 50 MG 24 hr tablet Take 1 and 1/2 tablet twice a day (75mg ) 180 tablet 3  . Multiple Vitamin (MULTIVITAMIN) capsule Take 1 capsule by mouth daily.    . rivaroxaban (XARELTO) 20 MG TABS tablet Take 1 tablet (20 mg total) by mouth daily with supper. 30 tablet 6  . sertraline (ZOLOFT) 100 MG tablet     . tadalafil (CIALIS) 5 MG tablet Take 1 tablet by mouth as directed.     No current facility-administered medications for this encounter.     No Known Allergies  Social History   Socioeconomic History  . Marital status: Married    Spouse name: Not on file  . Number of children: Not on file  . Years of education: Not on file  . Highest education level: Not on file  Occupational History  . Not on file  Social Needs  . Financial resource strain: Not on file  . Food insecurity:    Worry: Not on file    Inability: Not on file  .  Transportation needs:    Medical: Not on file    Non-medical: Not on file  Tobacco Use  . Smoking status: Never Smoker  . Smokeless tobacco: Never Used  Substance and Sexual Activity  . Alcohol use: Not on file  . Drug use: Not on file  . Sexual activity: Not on file  Lifestyle  . Physical activity:    Days per week: Not on file    Minutes per session: Not on file  . Stress: Not on file  Relationships  . Social connections:    Talks on phone: Not on file    Gets together: Not on file    Attends religious service: Not on file    Active member of club or organization: Not on file    Attends meetings of clubs or organizations: Not  on file    Relationship status: Not on file  . Intimate partner violence:    Fear of current or ex partner: Not on file    Emotionally abused: Not on file    Physically abused: Not on file    Forced sexual activity: Not on file  Other Topics Concern  . Not on file  Social History Narrative   He is married now for 4 years (remarried).  He has 1 child (presumably from previous marriage -70 years old).   He lives with his wife.  He is an avid Chief Executive Officer.   He is a retired Producer, television/film/video for [employer].  (He has a BA in American studies at [employer].)   Never smoked.  Drinks 5-7 alcoholic beverages a week usually beer or hard cider.  May be occasionally a mixed drink.   He is quite active exercise at least 4 days a week for 30 minutes at a time.       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  He enjoys walking on both treadmill and in the community.  He plays golf routinely.    No family history on file.  ROS- All systems are reviewed and negative except as per the HPI above  Physical Exam: Vitals:   12/06/17 1520  BP: 124/82  Pulse: (!) 113  Weight: 102.1 kg  Height: 5' 10.5" (1.791 m)   Wt Readings from Last 3 Encounters:  12/06/17 102.1 kg  12/02/17 104.6 kg  10/26/17 105.7 kg    Labs: Lab Results  Component Value Date   NA 137 12/06/2017   K 4.5 12/06/2017   CL 107 12/06/2017   CO2 24 12/06/2017   GLUCOSE 93 12/06/2017   BUN 22 12/06/2017   CREATININE 1.45 (H) 12/06/2017   CALCIUM 10.0 12/06/2017   No results found for: INR No results found for: CHOL, HDL, LDLCALC, TRIG   GEN- The patient is well appearing, alert and oriented x 3 today.   Head- normocephalic, atraumatic Eyes-  Sclera clear, conjunctiva pink Ears- hearing intact Oropharynx- clear Neck- supple, no JVP Lymph- no cervical lymphadenopathy Lungs- Clear to ausculation bilaterally, normal  work of breathing Heart- irregular rate and rhythm, no murmurs, rubs or gallops, PMI not laterally displaced GI- soft, NT, ND, + BS Extremities- no clubbing, cyanosis, or edema MS- no significant deformity or atrophy Skin- no rash or lesion Psych- euthymic mood, full affect Neuro- strength and sensation are intact  EKG- afib at 103 bpm Echo-Study Conclusions  - Left ventricle: The cavity size was normal. Wall thickness was  increased in a pattern of severe LVH. Systolic function was   moderately reduced. The estimated ejection fraction was in the   range of 35% to 40%. - Aortic valve: There was mild regurgitation. - Mitral valve: There was mild regurgitation. - Left atrium: The atrium was mildly dilated. - Right atrium: The atrium was mildly dilated.    Assessment and Plan: 1. Persistent symptomatic  new onset afib  Increased BB but failed to obtain rate control Recent RVR with presentation for cardiac CT , unable to perform 2/2 afib with RVR Increased Toprol to 50 mg 1 1/2 tab bid for better rate control, but did not have much effect  Will go ahead and schedule for cardioversion as pt has been on anticoagulation x 5 weeks without missed doses Since pt says he runs in the 60's when in SR, I will decrease Toprol back to 50 mg bid just prior to cardioversion Bmet/cbc today  2. CHA2DS2VASc score of at least 3 Continue xarelto 20 mg appropriately dosed at crcl cal at 1.86(lone kidney)  3. LV dysfunction Hard to know at this point if EF is reduced 2/2 tachycardia mediated cardiomyopathy or other underlying disease Cardiac CT pending when able to perform    Butch Penny C. Jocelin Schuelke, [facility] [address] [place], [place] [address] [phone]

## 2017-12-07 NOTE — H&P (View-Only) (Signed)
Primary Care Physician: Lajean Manes, MD Referring Physician: Dr. Lewanda Rife Ripberger is a 72 y.o. male with a h/o CKD, HTN, new onset of afib with recent  echo showing EF of 35-40%, severe LVH. He was scheduled for cardiac CT  yesterday for further evaluation by Dr. Ellyn Hack but presented with RVR so test could not be done. He was asked to come here for f/u. He reports that he just finished a prednisone taper yesterday for rt hip/knee pain. He is in afib at 103 bpm today. He has noted persistent shortness of breath for around one month but intermittent fatigue and shortness of breath for one year.  He was very active 4-6 weeks ago and now reports that he is now very sedentary 2/2 his shortness of breath. No prior echo to compare. Was changed from  Cardizem to Toprol with reduced EF.Does use CPAP every night. Nightly alcohol intake. No tobacco, or excessive caffeine.weight stable. No PND/orthopnea.  F/u in afib clinic, pt continues in afib, not rate controlled even with increase in BB. Cardiac CT can not be rescheduled unless pt is rate controlled. We discussed going ahead with cardioversion since the pt has been on anticoagulation x 5 weeks, no missed  doses. Hopefully, he will be able to restore SR and be able to complete CT.   Today, he denies symptoms of palpitations, chest pain, +shortness of breath,  No orthopnea, PND, mild lower extremity edema, dizziness, presyncope, syncope, or neurologic sequela. The patient is tolerating medications without difficulties and is otherwise without complaint today.   Past Medical History:  Diagnosis Date  . Basal cell carcinoma (BCC) of back    Dr. Elvera Lennox  . CKD (chronic kidney disease) stage 3, GFR 30-59 ml/min (HCC)    Unilateral kidney  . Diverticulosis of colon    With polyps (Dr. Amedeo Plenty March 2017, followed by Dr. Glennon Hamilton)  . Erectile dysfunction   . Essential hypertension 2018  . Gout   . OSA on CPAP    [facility]  . Status post nephrectomy 1965  . Thrombocytopenia, unspecified (HCC)    Platelet clumping (pseudothrombocytopenia)   Past Surgical History:  Procedure Laterality Date  . APPENDECTOMY     During childhood  . NEPHRECTOMY Right 1965  . VASECTOMY  1989    Current Outpatient Medications  Medication Sig Dispense Refill  . allopurinol (ZYLOPRIM) 300 MG tablet     . metoprolol succinate (TOPROL-XL) 50 MG 24 hr tablet Take 1 and 1/2 tablet twice a day (75mg ) 180 tablet 3  . Multiple Vitamin (MULTIVITAMIN) capsule Take 1 capsule by mouth daily.    . rivaroxaban (XARELTO) 20 MG TABS tablet Take 1 tablet (20 mg total) by mouth daily with supper. 30 tablet 6  . sertraline (ZOLOFT) 100 MG tablet     . tadalafil (CIALIS) 5 MG tablet Take 1 tablet by mouth as directed.     No current facility-administered medications for this encounter.     No Known Allergies  Social History   Socioeconomic History  . Marital status: Married    Spouse name: Not on file  . Number of children: Not on file  . Years of education: Not on file  . Highest education level: Not on file  Occupational History  . Not on file  Social Needs  . Financial resource strain: Not on file  . Food insecurity:    Worry: Not on file    Inability: Not on file  .  Transportation needs:    Medical: Not on file    Non-medical: Not on file  Tobacco Use  . Smoking status: Never Smoker  . Smokeless tobacco: Never Used  Substance and Sexual Activity  . Alcohol use: Not on file  . Drug use: Not on file  . Sexual activity: Not on file  Lifestyle  . Physical activity:    Days per week: Not on file    Minutes per session: Not on file  . Stress: Not on file  Relationships  . Social connections:    Talks on phone: Not on file    Gets together: Not on file    Attends religious service: Not on file    Active member of club or organization: Not on file    Attends meetings of clubs or organizations: Not  on file    Relationship status: Not on file  . Intimate partner violence:    Fear of current or ex partner: Not on file    Emotionally abused: Not on file    Physically abused: Not on file    Forced sexual activity: Not on file  Other Topics Concern  . Not on file  Social History Narrative   He is married now for 4 years (remarried).  He has 1 child (presumably from previous marriage -70 years old).   He lives with his wife.  He is an avid Chief Executive Officer.   He is a retired Producer, television/film/video for [employer].  (He has a BA in American studies at [employer].)   Never smoked.  Drinks 5-7 alcoholic beverages a week usually beer or hard cider.  May be occasionally a mixed drink.   He is quite active exercise at least 4 days a week for 30 minutes at a time.       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  He enjoys walking on both treadmill and in the community.  He plays golf routinely.    No family history on file.  ROS- All systems are reviewed and negative except as per the HPI above  Physical Exam: Vitals:   12/06/17 1520  BP: 124/82  Pulse: (!) 113  Weight: 102.1 kg  Height: 5' 10.5" (1.791 m)   Wt Readings from Last 3 Encounters:  12/06/17 102.1 kg  12/02/17 104.6 kg  10/26/17 105.7 kg    Labs: Lab Results  Component Value Date   NA 137 12/06/2017   K 4.5 12/06/2017   CL 107 12/06/2017   CO2 24 12/06/2017   GLUCOSE 93 12/06/2017   BUN 22 12/06/2017   CREATININE 1.45 (H) 12/06/2017   CALCIUM 10.0 12/06/2017   No results found for: INR No results found for: CHOL, HDL, LDLCALC, TRIG   GEN- The patient is well appearing, alert and oriented x 3 today.   Head- normocephalic, atraumatic Eyes-  Sclera clear, conjunctiva pink Ears- hearing intact Oropharynx- clear Neck- supple, no JVP Lymph- no cervical lymphadenopathy Lungs- Clear to ausculation bilaterally, normal  work of breathing Heart- irregular rate and rhythm, no murmurs, rubs or gallops, PMI not laterally displaced GI- soft, NT, ND, + BS Extremities- no clubbing, cyanosis, or edema MS- no significant deformity or atrophy Skin- no rash or lesion Psych- euthymic mood, full affect Neuro- strength and sensation are intact  EKG- afib at 103 bpm Echo-Study Conclusions  - Left ventricle: The cavity size was normal. Wall thickness was  increased in a pattern of severe LVH. Systolic function was   moderately reduced. The estimated ejection fraction was in the   range of 35% to 40%. - Aortic valve: There was mild regurgitation. - Mitral valve: There was mild regurgitation. - Left atrium: The atrium was mildly dilated. - Right atrium: The atrium was mildly dilated.    Assessment and Plan: 1. Persistent symptomatic  new onset afib  Increased BB but failed to obtain rate control Recent RVR with presentation for cardiac CT , unable to perform 2/2 afib with RVR Increased Toprol to 50 mg 1 1/2 tab bid for better rate control, but did not have much effect  Will go ahead and schedule for cardioversion as pt has been on anticoagulation x 5 weeks without missed doses Since pt says he runs in the 60's when in SR, I will decrease Toprol back to 50 mg bid just prior to cardioversion Bmet/cbc today  2. CHA2DS2VASc score of at least 3 Continue xarelto 20 mg appropriately dosed at crcl cal at 1.86(lone kidney)  3. LV dysfunction Hard to know at this point if EF is reduced 2/2 tachycardia mediated cardiomyopathy or other underlying disease Cardiac CT pending when able to perform    Butch Penny C. Chiquetta Langner, [facility] [address] [place], [place] [address] [phone]

## 2017-12-08 DIAGNOSIS — Z23 Encounter for immunization: Secondary | ICD-10-CM | POA: Diagnosis not present

## 2017-12-14 ENCOUNTER — Encounter (HOSPITAL_COMMUNITY): Payer: Self-pay | Admitting: *Deleted

## 2017-12-14 ENCOUNTER — Ambulatory Visit (HOSPITAL_COMMUNITY): Payer: Medicare Other | Admitting: Anesthesiology

## 2017-12-14 ENCOUNTER — Other Ambulatory Visit: Payer: Self-pay

## 2017-12-14 ENCOUNTER — Encounter (HOSPITAL_COMMUNITY)
Admission: RE | Disposition: A | Discharge status: Home / Self Care | Payer: Self-pay | Source: Ambulatory Visit | Attending: Cardiology

## 2017-12-14 ENCOUNTER — Ambulatory Visit (HOSPITAL_COMMUNITY)
Admission: RE | Admit: 2017-12-14 | Discharge: 2017-12-14 | Disposition: A | Discharge status: Home / Self Care | Payer: Medicare Other | Source: Ambulatory Visit | Attending: Cardiology | Admitting: Cardiology

## 2017-12-14 DIAGNOSIS — Z7901 Long term (current) use of anticoagulants: Secondary | ICD-10-CM | POA: Diagnosis not present

## 2017-12-14 DIAGNOSIS — G4733 Obstructive sleep apnea (adult) (pediatric): Secondary | ICD-10-CM | POA: Insufficient documentation

## 2017-12-14 DIAGNOSIS — E669 Obesity, unspecified: Secondary | ICD-10-CM | POA: Diagnosis not present

## 2017-12-14 DIAGNOSIS — N183 Chronic kidney disease, stage 3 (moderate): Secondary | ICD-10-CM | POA: Diagnosis not present

## 2017-12-14 DIAGNOSIS — I4819 Other persistent atrial fibrillation: Secondary | ICD-10-CM | POA: Insufficient documentation

## 2017-12-14 DIAGNOSIS — I129 Hypertensive chronic kidney disease with stage 1 through stage 4 chronic kidney disease, or unspecified chronic kidney disease: Secondary | ICD-10-CM | POA: Insufficient documentation

## 2017-12-14 DIAGNOSIS — Z6831 Body mass index (BMI) 31.0-31.9, adult: Secondary | ICD-10-CM | POA: Diagnosis not present

## 2017-12-14 DIAGNOSIS — Z905 Acquired absence of kidney: Secondary | ICD-10-CM | POA: Diagnosis not present

## 2017-12-14 DIAGNOSIS — M109 Gout, unspecified: Secondary | ICD-10-CM | POA: Insufficient documentation

## 2017-12-14 DIAGNOSIS — I10 Essential (primary) hypertension: Secondary | ICD-10-CM | POA: Diagnosis not present

## 2017-12-14 DIAGNOSIS — I4510 Unspecified right bundle-branch block: Secondary | ICD-10-CM | POA: Diagnosis not present

## 2017-12-14 DIAGNOSIS — I480 Paroxysmal atrial fibrillation: Secondary | ICD-10-CM

## 2017-12-14 DIAGNOSIS — I080 Rheumatic disorders of both mitral and aortic valves: Secondary | ICD-10-CM | POA: Diagnosis not present

## 2017-12-14 DIAGNOSIS — I4891 Unspecified atrial fibrillation: Secondary | ICD-10-CM | POA: Diagnosis not present

## 2017-12-14 HISTORY — PX: CARDIOVERSION: SHX1299

## 2017-12-14 SURGERY — CARDIOVERSION
Anesthesia: General

## 2017-12-14 MED ORDER — PROPOFOL 10 MG/ML IV BOLUS
INTRAVENOUS | Status: DC | PRN
  Administered 2017-12-14 (×2): 50 mg via INTRAVENOUS

## 2017-12-14 MED ORDER — LIDOCAINE 2% (20 MG/ML) 5 ML SYRINGE
INTRAMUSCULAR | Status: DC | PRN
  Administered 2017-12-14: 60 mg via INTRAVENOUS

## 2017-12-14 MED ORDER — SODIUM CHLORIDE 0.9 % IV SOLN
INTRAVENOUS | Status: DC | PRN
  Administered 2017-12-14: 14:00:00 via INTRAVENOUS

## 2017-12-14 NOTE — Interval H&P Note (Signed)
History and Physical Interval Note:  12/14/2017 12:38 PM  Bruce Yoder  has presented today for surgery, with the diagnosis of A-FIB  The various methods of treatment have been discussed with the patient and family. After consideration of risks, benefits and other options for treatment, the patient has consented to  Procedure(s): CARDIOVERSION (N/A) as a surgical intervention .  The patient's history has been reviewed, patient examined, no change in status, stable for surgery.  I have reviewed the patient's chart and labs.  Questions were answered to the patient's satisfaction.     Fransico Him

## 2017-12-14 NOTE — Anesthesia Procedure Notes (Signed)
Procedure Name: General with mask airway Performed by: Milford Cage, CRNA Pre-anesthesia Checklist: Patient identified, Emergency Drugs available, Suction available, Patient being monitored and Timeout performed Patient Re-evaluated:Patient Re-evaluated prior to induction Oxygen Delivery Method: Ambu bag Preoxygenation: Pre-oxygenation with 100% oxygen Induction Type: IV induction

## 2017-12-14 NOTE — Transfer of Care (Signed)
Immediate Anesthesia Transfer of Care Note  Patient: Bruce Yoder  Procedure(s) Performed: CARDIOVERSION (N/A )  Patient Location: PACU and Endoscopy Unit  Anesthesia Type:General  Level of Consciousness: drowsy  Airway & Oxygen Therapy: Patient Spontanous Breathing  Post-op Assessment: Report given to RN and Post -op Vital signs reviewed and stable  Post vital signs: Reviewed and stable  Last Vitals:  Vitals Value Taken Time  BP 95/70 12/14/2017  1:50 PM  Temp    Pulse 50 12/14/2017  1:52 PM  Resp 16 12/14/2017  1:52 PM  SpO2 97 % 12/14/2017  1:52 PM  Vitals shown include unvalidated device data.  Last Pain:  Vitals:   12/14/17 1316  TempSrc: Oral  PainSc:          Complications: No apparent anesthesia complications

## 2017-12-14 NOTE — Discharge Instructions (Signed)
Electrical Cardioversion, Care After °This sheet gives you information about how to care for yourself after your procedure. Your health care provider may also give you more specific instructions. If you have problems or questions, contact your health care provider. °What can I expect after the procedure? °After the procedure, it is common to have: °· Some redness on the skin where the shocks were given. ° °Follow these instructions at home: °· Do not drive for 24 hours if you were given a medicine to help you relax (sedative). °· Take over-the-counter and prescription medicines only as told by your health care provider. °· Ask your health care provider how to check your pulse. Check it often. °· Rest for 48 hours after the procedure or as told by your health care provider. °· Avoid or limit your caffeine use as told by your health care provider. °Contact a health care provider if: °· You feel like your heart is beating too quickly or your pulse is not regular. °· You have a serious muscle cramp that does not go away. °Get help right away if: °· You have discomfort in your chest. °· You are dizzy or you feel faint. °· You have trouble breathing or you are short of breath. °· Your speech is slurred. °· You have trouble moving an arm or leg on one side of your body. °· Your fingers or toes turn cold or blue. °This information is not intended to replace advice given to you by your health care provider. Make sure you discuss any questions you have with your health care provider. °Document Released: 11/30/2012 Document Revised: 09/13/2015 Document Reviewed: 08/16/2015 °Elsevier Interactive Patient Education © 2018 [employer]vier Inc[employer] ° °

## 2017-12-14 NOTE — CV Procedure (Signed)
   Electrical Cardioversion Procedure Note Cleotha Yoder [id number] 10-12-1945  Procedure: Electrical Cardioversion Indications:  Atrial Fibrillation  Time Out: Verified patient identification, verified procedure,medications/allergies/relevent history reviewed, required imaging and test results available.  Performed  Procedure Details  The patient was NPO after midnight. Anesthesia was administered at the beside  by Dr.Ellender with 100mg  of propofol and 60mg  Lidocaine.  Cardioversion was done with synchronized biphasic defibrillation with AP pads with 150watts.  The patient converted to normal sinus rhythm. The patient tolerated the procedure well   IMPRESSION:  Successful cardioversion of atrial fibrillation    Bruce Yoder 12/14/2017, 12:38 PM

## 2017-12-14 NOTE — Anesthesia Preprocedure Evaluation (Addendum)
Anesthesia Evaluation  Patient identified by MRN, date of birth, ID band Patient awake    Reviewed: Allergy & Precautions, NPO status , Patient's Chart, lab work & pertinent test results  Airway Mallampati: II  TM Distance: >3 FB Neck ROM: Full    Dental no notable dental hx.    Pulmonary sleep apnea and Continuous Positive Airway Pressure Ventilation ,    Pulmonary exam normal breath sounds clear to auscultation       Cardiovascular hypertension, Pt. on home beta blockers + dysrhythmias  Rhythm:Irregular Rate:Normal  ECG: rate 113 Atrial fibrillation with rapid ventricular response Left axis deviation Pulmonary disease pattern Incomplete right bundle branch Conchas  ECHO: Left ventricle: The cavity size was normal. Wall thickness was increased in a pattern of severe LVH. Systolic function was moderately reduced. The estimated ejection fraction was in the range of 35% to 40%. Aortic valve: There was mild regurgitation. Mitral valve: There was mild regurgitation. Left atrium: The atrium was mildly dilated. Right atrium: The atrium was mildly dilated.     Neuro/Psych negative neurological ROS  negative psych ROS   GI/Hepatic negative GI ROS, Neg liver ROS,   Endo/Other  negative endocrine ROS  Renal/GU Renal disease     Musculoskeletal negative musculoskeletal ROS (+) Gout   Abdominal (+) + obese,   Peds  Hematology negative hematology ROS (+)   Anesthesia Other Findings A-FIB  Reproductive/Obstetrics                            Anesthesia Physical Anesthesia Plan  ASA: IV  Anesthesia Plan: General   Post-op Pain Management:    Induction: Intravenous  PONV Risk Score and Plan: 2 and Propofol infusion and Treatment may vary due to age or medical condition  Airway Management Planned: Mask  Additional Equipment:   Intra-op Plan:   Post-operative Plan:   Informed Consent: I  have reviewed the patients History and Physical, chart, labs and discussed the procedure including the risks, benefits and alternatives for the proposed anesthesia with the patient or authorized representative who has indicated his/her understanding and acceptance.   Dental advisory given  Plan Discussed with: CRNA  Anesthesia Plan Comments:        Anesthesia Quick Evaluation

## 2017-12-15 ENCOUNTER — Encounter (HOSPITAL_COMMUNITY): Payer: Self-pay | Admitting: Cardiology

## 2017-12-15 NOTE — Anesthesia Postprocedure Evaluation (Signed)
Anesthesia Post Note  Patient: Bruce Yoder  Procedure(s) Performed: CARDIOVERSION (N/A )     Patient location during evaluation: PACU Anesthesia Type: General Level of consciousness: awake and alert Pain management: pain level controlled Vital Signs Assessment: post-procedure vital signs reviewed and stable Respiratory status: spontaneous breathing, nonlabored ventilation, respiratory function stable and patient connected to nasal cannula oxygen Cardiovascular status: blood pressure returned to baseline and stable Postop Assessment: no apparent nausea or vomiting Anesthetic complications: no    Last Vitals:  Vitals:   12/14/17 1405 12/14/17 1415  BP: 103/71 103/74  Pulse: (!) 55 (!) 52  Resp: 12 12  Temp:    SpO2: 97% 99%    Last Pain:  Vitals:   12/14/17 1415  TempSrc:   PainSc: 0-No pain                 Bruce Yoder

## 2017-12-21 ENCOUNTER — Encounter (HOSPITAL_COMMUNITY): Payer: Self-pay | Admitting: Nurse Practitioner

## 2017-12-21 ENCOUNTER — Ambulatory Visit (HOSPITAL_COMMUNITY)
Admission: RE | Admit: 2017-12-21 | Discharge: 2017-12-21 | Disposition: A | Discharge status: Home / Self Care | Payer: Medicare Other | Source: Ambulatory Visit | Attending: Nurse Practitioner | Admitting: Nurse Practitioner

## 2017-12-21 VITALS — BP 142/84 | HR 46 | Ht 70.5 in | Wt 231.0 lb

## 2017-12-21 DIAGNOSIS — M109 Gout, unspecified: Secondary | ICD-10-CM | POA: Insufficient documentation

## 2017-12-21 DIAGNOSIS — I4819 Other persistent atrial fibrillation: Secondary | ICD-10-CM | POA: Insufficient documentation

## 2017-12-21 DIAGNOSIS — I501 Left ventricular failure: Secondary | ICD-10-CM | POA: Diagnosis not present

## 2017-12-21 DIAGNOSIS — Z905 Acquired absence of kidney: Secondary | ICD-10-CM | POA: Insufficient documentation

## 2017-12-21 DIAGNOSIS — I4891 Unspecified atrial fibrillation: Secondary | ICD-10-CM

## 2017-12-21 DIAGNOSIS — Z79899 Other long term (current) drug therapy: Secondary | ICD-10-CM | POA: Insufficient documentation

## 2017-12-21 DIAGNOSIS — G4733 Obstructive sleep apnea (adult) (pediatric): Secondary | ICD-10-CM | POA: Insufficient documentation

## 2017-12-21 DIAGNOSIS — Z9889 Other specified postprocedural states: Secondary | ICD-10-CM | POA: Diagnosis not present

## 2017-12-21 DIAGNOSIS — N183 Chronic kidney disease, stage 3 (moderate): Secondary | ICD-10-CM | POA: Diagnosis not present

## 2017-12-21 DIAGNOSIS — I129 Hypertensive chronic kidney disease with stage 1 through stage 4 chronic kidney disease, or unspecified chronic kidney disease: Secondary | ICD-10-CM | POA: Insufficient documentation

## 2017-12-21 DIAGNOSIS — Z7901 Long term (current) use of anticoagulants: Secondary | ICD-10-CM | POA: Insufficient documentation

## 2017-12-21 MED ORDER — METOPROLOL SUCCINATE ER 50 MG PO TB24: 50.0000 mg | ORAL_TABLET | Freq: Two times a day (BID) | ORAL | 3 refills | Status: DC

## 2017-12-21 MED ORDER — FUROSEMIDE 20 MG PO TABS: ORAL_TABLET | ORAL | 0 refills | Status: DC

## 2017-12-21 NOTE — Progress Notes (Signed)
Primary Care Physician: Lajean Manes, MD Referring Physician: Dr. Lewanda Rife Nilsen is a 72 y.o. male with a h/o CKD, HTN, new onset of afib with recent  echo showing EF of 35-40%, severe LVH. He was scheduled for cardiac CT  yesterday for further evaluation by Dr. Ellyn Hack but presented with RVR so test could not be done. He was asked to come here for f/u. He reports that he just finished a prednisone taper yesterday for rt hip/knee pain. He is in afib at 103 bpm today. He has noted persistent shortness of breath for around one month but intermittent fatigue and shortness of breath for one year.  He was very active 4-6 weeks ago and now reports that he is now very sedentary 2/2 his shortness of breath. No prior echo to compare. Was changed from  Cardizem to Toprol with reduced EF.Does use CPAP every night. Nightly alcohol intake. No tobacco, or excessive caffeine.weight stable. No PND/orthopnea.  F/u in afib clinic, pt continues in afib, not rate controlled even with increase in BB. Cardiac CT can not be rescheduled unless pt is rate controlled. We discussed going ahead with cardioversion since the pt has been on anticoagulation x 5 weeks, no missed  doses. Hopefully, he will be able to restore SR and be able to complete CT.   F/u in afib clinic, 10/29. He did have successful cardioversion and has continued in SR. His HR is in the 40's and will cut back on metoprolol. He feels that he may have  some extra fluid. He continues to have some shortness of breath even in SR.  He has chronic swollen ankles, but feels that they are more swollen since the cardioversion. Weight is up 2.5 lbs from last visit. He and his wife have many concerns today, he is interested in getting the circulation to his legs evaluated. They are suppose to fly to [place]  this next Saturday but the wife wants to cancel the trip, the pt wants to go. Now that he is back in rhythm, I will let Dr. Ellyn Hack know so cardiac CT can be  rescheduled.  Today, he denies symptoms of palpitations, chest pain, +shortness of breath,  No orthopnea, PND, mild lower extremity edema, dizziness, presyncope, syncope, or neurologic sequela. The patient is tolerating medications without difficulties and is otherwise without complaint today.   Past Medical History:  Diagnosis Date  . Basal cell carcinoma (BCC) of back    Dr. Elvera Lennox  . CKD (chronic kidney disease) stage 3, GFR 30-59 ml/min (HCC)    Unilateral kidney  . Diverticulosis of colon    With polyps (Dr. Amedeo Plenty March 2017, followed by Dr. Glennon Hamilton)  . Erectile dysfunction   . Essential hypertension 2018  . Gout   . OSA on CPAP    [facility]  . Status post nephrectomy 1965  . Thrombocytopenia, unspecified (HCC)    Platelet clumping (pseudothrombocytopenia)   Past Surgical History:  Procedure Laterality Date  . APPENDECTOMY     During childhood  . CARDIOVERSION N/A 12/14/2017   Procedure: CARDIOVERSION;  Surgeon: Sueanne Margarita, MD;  Location: [facility] ENDOSCOPY;  Service: Cardiovascular;  Laterality: N/A;  . NEPHRECTOMY Right 1965  . VASECTOMY  1989    Current Outpatient Medications  Medication Sig Dispense Refill  . allopurinol (ZYLOPRIM) 300 MG tablet Take 150 mg by mouth daily.     . metoprolol succinate (TOPROL-XL) 50 MG 24 hr tablet Take 1 tablet (50 mg total) by  mouth 2 (two) times daily. 180 tablet 3  . Multiple Vitamin (MULTIVITAMIN) capsule Take 1 capsule by mouth daily.    . rivaroxaban (XARELTO) 20 MG TABS tablet Take 1 tablet (20 mg total) by mouth daily with supper. 30 tablet 6  . sertraline (ZOLOFT) 100 MG tablet Take 50 mg by mouth daily.     . tadalafil (CIALIS) 5 MG tablet Take 1 tablet by mouth daily as needed for erectile dysfunction.     . furosemide (LASIX) 20 MG tablet Take 1 tablet by mouth for the next 3 days 3 tablet 0   No current facility-administered medications for this encounter.     No Known Allergies  Social  History   Socioeconomic History  . Marital status: Married    Spouse name: Not on file  . Number of children: Not on file  . Years of education: Not on file  . Highest education level: Not on file  Occupational History  . Not on file  Social Needs  . Financial resource strain: Not on file  . Food insecurity:    Worry: Not on file    Inability: Not on file  . Transportation needs:    Medical: Not on file    Non-medical: Not on file  Tobacco Use  . Smoking status: Never Smoker  . Smokeless tobacco: Never Used  Substance and Sexual Activity  . Alcohol use: Yes    Comment: moderate   . Drug use: Never  . Sexual activity: Not on file  Lifestyle  . Physical activity:    Days per week: Not on file    Minutes per session: Not on file  . Stress: Not on file  Relationships  . Social connections:    Talks on phone: Not on file    Gets together: Not on file    Attends religious service: Not on file    Active member of club or organization: Not on file    Attends meetings of clubs or organizations: Not on file    Relationship status: Not on file  . Intimate partner violence:    Fear of current or ex partner: Not on file    Emotionally abused: Not on file    Physically abused: Not on file    Forced sexual activity: Not on file  Other Topics Concern  . Not on file  Social History Narrative   He is married now for 4 years (remarried).  He has 1 child (presumably from previous marriage -70 years old).   He lives with his wife.  He is an avid Chief Executive Officer.   He is a retired Producer, television/film/video for [employer].  (He has a BA in American studies at [employer].)   Never smoked.  Drinks 5-7 alcoholic beverages a week usually beer or hard cider.  May be occasionally a mixed drink.   He is quite active exercise at least 4 days a week for 30 minutes at a time.       He enjoys riding his [employer] aerodyne road bike but also likes to ride stationary bicycle and do the  Market researcher.  He enjoys walking on both treadmill and in the community.  He plays golf routinely.    No family history on file.  ROS- All systems are reviewed and negative except as per the HPI above  Physical Exam: Vitals:   12/21/17 1442  BP: (!) 142/84  Pulse: (!) 46  Weight: 104.8 kg  Height: 5' 10.5" (1.791 m)  Wt Readings from Last 3 Encounters:  12/21/17 104.8 kg  12/14/17 102.1 kg  12/06/17 102.1 kg    Labs: Lab Results  Component Value Date   NA 137 12/06/2017   K 4.5 12/06/2017   CL 107 12/06/2017   CO2 24 12/06/2017   GLUCOSE 93 12/06/2017   BUN 22 12/06/2017   CREATININE 1.45 (H) 12/06/2017   CALCIUM 10.0 12/06/2017   No results found for: INR No results found for: CHOL, HDL, LDLCALC, TRIG   GEN- The patient is well appearing, alert and oriented x 3 today.   Head- normocephalic, atraumatic Eyes-  Sclera clear, conjunctiva pink Ears- hearing intact Oropharynx- clear Neck- supple, no JVP Lymph- no cervical lymphadenopathy Lungs- Clear to ausculation bilaterally, normal work of breathing Heart-slow regular rate and rhythm, no murmurs, rubs or gallops, PMI not laterally displaced GI- soft, NT, ND, + BS Extremities- no clubbing, cyanosis, or edema MS- no significant deformity or atrophy Skin- no rash or lesion Psych- euthymic mood, full affect Neuro- strength and sensation are intact  EKG- sinus brady at 46 bpm, PR int 190 ms, qrs int 92 ms, qtc 427 ms Echo-Study Conclusions  - Left ventricle: The cavity size was normal. Wall thickness was   increased in a pattern of severe LVH. Systolic function was   moderately reduced. The estimated ejection fraction was in the   range of 35% to 40%. - Aortic valve: There was mild regurgitation. - Mitral valve: There was mild regurgitation. - Left atrium: The atrium was mildly dilated. - Right atrium: The atrium was mildly dilated.    Assessment and Plan: 1. Persistent symptomatic new onset afib    Successful cardioversion and continues in sinus rhythm but slow Will decrease Toprol back to 50 mg Will need cardiac CT rescheduled per Dr. Allison Quarry office  2. CHA2DS2VASc score of at least 3 Continue xarelto 20 mg appropriately dosed at crcl cal at 4.86(lone kidney)  3. LV dysfunction Hard to know at this point if EF is reduced 2/2 tachycardia mediated cardiomyopathy or other underlying disease Cardiac CT pending   Will rx 20 mg tab x 3(no refills) to use for extra LLE  Maleiya Pergola C. Zalmen Wrightsman, [facility] [address] [place], [place] [address] [phone]

## 2017-12-21 NOTE — Patient Instructions (Addendum)
Decrease metoprolol to 1 tablet twice a day  Start lasix 20mg  1 tablet a day for the next 3 days  Dr. Ellyn Hack office to call to schedule cardiac ct

## 2017-12-22 DIAGNOSIS — H524 Presbyopia: Secondary | ICD-10-CM | POA: Diagnosis not present

## 2017-12-22 DIAGNOSIS — H25813 Combined forms of age-related cataract, bilateral: Secondary | ICD-10-CM | POA: Diagnosis not present

## 2017-12-23 ENCOUNTER — Telehealth: Payer: Self-pay | Admitting: Cardiology

## 2017-12-23 NOTE — Telephone Encounter (Signed)
Left message - per dr Ellyn Hack, OKAY TO FLY AND OKAY TO FLY WHILE TAKING XARELTO  STILL WORKING ON [address]  ANY QUESTION MAY CALL BACK

## 2017-12-23 NOTE — Telephone Encounter (Signed)
Pt c/o medication issue:  1. Name of Medication: rivaroxaban (XARELTO)   2. How are you currently taking this medication (dosage and times per day)? Take 1 tablet (20 mg total) by mouth daily    3. Are you having a reaction (difficulty breathing--STAT)? no  4. What is your medication issue? Patient would like to know if they can travel while on this medication

## 2017-12-23 NOTE — Telephone Encounter (Signed)
-----   Message from Juluis Mire, RN sent at 12/21/2017  3:15 PM EDT ----- Regarding: cardiac ct  Patient is back in rhythm and is ready to be scheduled for the cardiac ct Dr. Ellyn Hack ordered. Please call patient to setup scheduling. He will be out of town all of next week would really like it to be completed before he leaves Saturday if possible. Also the wife Inez Catalina has a question regarding if he is ok to fly per Dr. Ellyn Hack. Her number is [phone]  Thanks!! [facility]

## 2017-12-23 NOTE — Telephone Encounter (Signed)
Returned pt call.[email or web address] 

## 2017-12-24 DIAGNOSIS — Z85828 Personal history of other malignant neoplasm of skin: Secondary | ICD-10-CM | POA: Diagnosis not present

## 2017-12-24 DIAGNOSIS — D2261 Melanocytic nevi of right upper limb, including shoulder: Secondary | ICD-10-CM | POA: Diagnosis not present

## 2017-12-24 DIAGNOSIS — D2361 Other benign neoplasm of skin of right upper limb, including shoulder: Secondary | ICD-10-CM | POA: Diagnosis not present

## 2017-12-24 DIAGNOSIS — D1801 Hemangioma of skin and subcutaneous tissue: Secondary | ICD-10-CM | POA: Diagnosis not present

## 2017-12-24 DIAGNOSIS — L570 Actinic keratosis: Secondary | ICD-10-CM | POA: Diagnosis not present

## 2017-12-24 DIAGNOSIS — L821 Other seborrheic keratosis: Secondary | ICD-10-CM | POA: Diagnosis not present

## 2017-12-24 DIAGNOSIS — D2362 Other benign neoplasm of skin of left upper limb, including shoulder: Secondary | ICD-10-CM | POA: Diagnosis not present

## 2017-12-24 DIAGNOSIS — D225 Melanocytic nevi of trunk: Secondary | ICD-10-CM | POA: Diagnosis not present

## 2017-12-24 DIAGNOSIS — D2262 Melanocytic nevi of left upper limb, including shoulder: Secondary | ICD-10-CM | POA: Diagnosis not present

## 2017-12-24 NOTE — Telephone Encounter (Signed)
Called patient regarding the message that was left.   Patient verbalized understanding that someone would contact him to schedule after he returned from vacation. And that it was okay to fly with Xarelto per Dr.Harding.  No questions or concerns at this time.

## 2017-12-27 ENCOUNTER — Other Ambulatory Visit: Payer: Self-pay | Admitting: *Deleted

## 2017-12-27 DIAGNOSIS — Z01818 Encounter for other preprocedural examination: Secondary | ICD-10-CM

## 2017-12-27 DIAGNOSIS — I4891 Unspecified atrial fibrillation: Secondary | ICD-10-CM

## 2017-12-27 DIAGNOSIS — R0609 Other forms of dyspnea: Principal | ICD-10-CM

## 2017-12-27 NOTE — Progress Notes (Signed)
RESCHEDULE  TEST DUE PATIENT NOT ABLE TO COMPLETE TEST 10 /9/19

## 2018-01-04 ENCOUNTER — Ambulatory Visit: Payer: Medicare Other | Admitting: Cardiology

## 2018-01-12 ENCOUNTER — Ambulatory Visit (HOSPITAL_COMMUNITY)
Admission: RE | Admit: 2018-01-12 | Discharge: 2018-01-12 | Disposition: A | Discharge status: Home / Self Care | Payer: Medicare Other | Source: Ambulatory Visit | Attending: Cardiology | Admitting: Cardiology

## 2018-01-12 DIAGNOSIS — I4891 Unspecified atrial fibrillation: Secondary | ICD-10-CM

## 2018-01-12 DIAGNOSIS — I2510 Atherosclerotic heart disease of native coronary artery without angina pectoris: Secondary | ICD-10-CM | POA: Insufficient documentation

## 2018-01-12 DIAGNOSIS — R0609 Other forms of dyspnea: Secondary | ICD-10-CM | POA: Diagnosis not present

## 2018-01-12 LAB — POCT I-STAT CREATININE: Creatinine, Ser: 1.3 mg/dL — ABNORMAL HIGH (ref 0.61–1.24)

## 2018-01-12 MED ORDER — NITROGLYCERIN 0.4 MG SL SUBL
SUBLINGUAL_TABLET | SUBLINGUAL | Status: AC
  Administered 2018-01-12: 0.8 mg
  Filled 2018-01-12: qty 2

## 2018-01-12 MED ORDER — NITROGLYCERIN 0.4 MG SL SUBL
0.8000 mg | SUBLINGUAL_TABLET | SUBLINGUAL | Status: DC | PRN
  Filled 2018-01-12: qty 25

## 2018-01-12 MED ORDER — IOPAMIDOL (ISOVUE-370) INJECTION 76%
100.0000 mL | Freq: Once | INTRAVENOUS | Status: AC | PRN
  Administered 2018-01-12: 100 mL via INTRAVENOUS

## 2018-01-19 ENCOUNTER — Encounter: Payer: Self-pay | Admitting: Cardiology

## 2018-01-19 ENCOUNTER — Ambulatory Visit (INDEPENDENT_AMBULATORY_CARE_PROVIDER_SITE_OTHER): Payer: Medicare Other | Admitting: Cardiology

## 2018-01-19 ENCOUNTER — Ambulatory Visit: Payer: Medicare Other | Admitting: Cardiology

## 2018-01-19 VITALS — BP 130/70 | HR 54 | Ht 70.5 in | Wt 233.6 lb

## 2018-01-19 DIAGNOSIS — Q600 Renal agenesis, unilateral: Secondary | ICD-10-CM

## 2018-01-19 DIAGNOSIS — Z01818 Encounter for other preprocedural examination: Secondary | ICD-10-CM | POA: Diagnosis not present

## 2018-01-19 DIAGNOSIS — I4819 Other persistent atrial fibrillation: Secondary | ICD-10-CM

## 2018-01-19 DIAGNOSIS — I420 Dilated cardiomyopathy: Secondary | ICD-10-CM

## 2018-01-19 DIAGNOSIS — I10 Essential (primary) hypertension: Secondary | ICD-10-CM | POA: Diagnosis not present

## 2018-01-19 DIAGNOSIS — R931 Abnormal findings on diagnostic imaging of heart and coronary circulation: Secondary | ICD-10-CM

## 2018-01-19 DIAGNOSIS — R0609 Other forms of dyspnea: Secondary | ICD-10-CM | POA: Diagnosis not present

## 2018-01-19 DIAGNOSIS — IMO0002 Reserved for concepts with insufficient information to code with codable children: Secondary | ICD-10-CM

## 2018-01-19 MED ORDER — ASPIRIN EC 81 MG PO TBEC: 81.0000 mg | DELAYED_RELEASE_TABLET | Freq: Every day | ORAL | 3 refills | Status: DC

## 2018-01-19 NOTE — Patient Instructions (Addendum)
Medication Instructions:  NO T NEEDED SEE INSTRUCTION SHEET FOR CARDIAC CATH  If you need a refill on your cardiac medications before your next appointment, please call your pharmacy.   Lab work: CBC BMP If you have labs (blood work) drawn today and your tests are completely normal, you will receive your results only by: Marland Kitchen MyChart Message (if you have MyChart) OR . A paper copy in the mail If you have any lab test that is abnormal or we need to change your treatment, we will call you to review the results.  Testing/Procedures:  [place] 9 ,2019  ARRIVE  9AM Your physician has requested that you have a cardiac catheterization. Cardiac catheterization is used to diagnose and/or treat various heart conditions. Doctors may recommend this procedure for a number of different reasons. The most common reason is to evaluate chest pain. Chest pain can be a symptom of coronary artery disease (CAD), and cardiac catheterization can show whether plaque is narrowing or blocking your heart's arteries. This procedure is also used to evaluate the valves, as well as measure the blood flow and oxygen levels in different parts of your heart. For further information please visit [email or web address][email or web address]. Please follow instruction sheet, as given.    Follow-Up: At [facility], you and your health needs are our priority.  As part of our continuing mission to provide you with exceptional heart care, we have created designated Provider Care Teams.  These Care Teams include your primary Cardiologist (physician) and Advanced Practice Providers (APPs -  Physician Assistants and Nurse Practitioners) who all work together to provide you with the care you need, when you need it. . Your physician recommends that you schedule a follow-up appointment in 3 to  [place].  Any Other Special Instructions Will Be Listed Below (If Applicable). SEE [place]  CARDIOVASCULAR DIVISION [facility] [place] [address] Dept: [phone] Loc: [place]  01/19/2018  You are scheduled for a Cardiac Catheterization on Monday, December 9 with Dr. Glenetta Hew.  1. Please arrive at the [facility] (Main Entrance A) at [facility]: [address] [place], [place] [address] at 9:00 AM (This time is two hours before your procedure to ensure your preparation). Free valet parking service is available.   Special note: Every effort is made to have your procedure done on time. Please understand that emergencies sometimes delay scheduled procedures.  2. Diet: Do not eat solid foods after midnight.  The patient may have clear liquids until 5am upon the day of the procedure.  3. Labs:  DONE TODAY , DAY OF PROCEDURE  4. Medication instructions in preparation for your procedure:  Stop taking Xarelto (Rivaroxaban) on Monday, December 8.  On the morning of your procedure, take your Aspirin 81 MG and any morning medicines NOT listed above.  You may use sips of water.  5. Plan for one night stay--bring personal belongings. 6. Bring a current list of your medications and current insurance cards. 7. You MUST have a responsible person to drive you home. 8. Someone MUST be with you the first 24 hours after you arrive home or your discharge will be delayed. 9. Please wear clothes that are easy to get on and off and wear slip-on shoes.  Thank you for allowing [place] to care for you!   -- [place] [facility]

## 2018-01-19 NOTE — H&P (View-Only) (Signed)
PCP: Lajean Manes, MD  Clinic Note: No chief complaint on file.   HPI: Bruce Yoder is a 72 y.o. male who is being seen today for the f/u evaluation of ABNORMAL CORONARY CTA ordered as part of evaluation for ATRIAL FIBRILLATION at the request of Lajean Manes, MD.  Bruce Yoder was seen for initial consult 10/26/17 -- then seen in [facility].  Recent Hospitalizations: None  Studies Personally Reviewed - (if available, images/films reviewed: From Epic Chart or Care Everywhere)  Holter Monitor 11/08/17: Underlying rhythm is A. fib with normal rapid response.  Rare PVCs noted one 7 beat episode of bigeminy.  Echo 10/2017: EF 35 -40% with severe LVH.  Diffuse global hypokinesis.  Mild aortic and mitral regurgitation.  Mild left and right atrial dilation.  DCCV 12/14/17:  Cor CTA -Coronary Calcium Score 0.  LAD mild 25-49% stenosis.  LCx proximal <25% -large caliber OM proximal 70-99% stenosis.  Ramus intermedius mild plaque, RCA proximal 25-49%) FFR -Left Yoder 0.97.  LAD distal 0.86.  RCA distal 0.91.  LCx distal 0.81, but large caliber OM 0.7 consistent with severe stenosis in the proximal portion. (With minimal disease elsewhere, would suggest that this may be progression of disease)  Interval History: Bruce Yoder presents here today for follow-up after cardioversion but also to discuss results of his various studies.  He feels much better after having been successfully cardioverted.  He definitely noted that when he was in A. fib he was getting significant exertional dyspnea and may be some mild chest tightness.  Now he really only notes the dyspnea and no chest tightness with exertion.  He is still able to do his elliptical trainer and the stationary bike, but has been reluctant to go "all out ".  He has not been playing as much golf of late because of sciatica pain, and really he noted the dyspnea and the most when he was walking up hills playing golf.  Since starting Xarelto, he has not had  any melena, hematochezia, epistaxis or hematuria.  He has not felt any recurrence of his irregular heartbeat palpitation symptoms of atrial fib. No syncope/near syncope or TIA/amorous fugax.  No claudication  ROS: A comprehensive was performed.  Symptoms in HPI Review of Systems  Constitutional: Negative for malaise/fatigue.  HENT: Negative for congestion (Recovering from recent cold) and nosebleeds.   Respiratory: Negative for cough, sputum production, shortness of breath (Per HPI) and wheezing.   Gastrointestinal: Negative for constipation, heartburn, nausea and vomiting.  Genitourinary: Negative for dysuria and frequency.  Musculoskeletal: Positive for joint pain (Right hip pain).  Neurological: Negative for dizziness, focal weakness, weakness and headaches.  Endo/Heme/Allergies: Does not bruise/bleed easily.  Psychiatric/Behavioral: Negative.   All other systems reviewed and are negative.  I have reviewed and (if needed) personally updated the patient's problem list, medications, allergies, past medical and surgical history, social and family history.   Past Medical History:  Diagnosis Date  . Basal cell carcinoma (BCC) of back    Dr. Elvera [place]  . CKD (chronic kidney disease) stage 3, GFR 30-59 ml/min (HCC)    Unilateral kidney  . Diverticulosis of colon    With polyps (Dr. Amedeo Plenty March 2017, followed by Dr. Glennon Hamilton)  . Erectile dysfunction   . Essential hypertension 2018  . Gout   . OSA on CPAP    [facility]  . Status post nephrectomy 1965  . Thrombocytopenia, unspecified (HCC)    Platelet clumping (pseudothrombocytopenia)    Past Surgical History:  Procedure Laterality Date  . APPENDECTOMY     During childhood  . CARDIOVERSION N/A 12/14/2017   Procedure: CARDIOVERSION;  Surgeon: Sueanne Margarita, MD;  Location: [facility] ENDOSCOPY;  Service: Cardiovascular;  Laterality: N/A;  . NEPHRECTOMY Right 1965  . TRANSTHORACIC ECHOCARDIOGRAM  10/2017    EF 35  -40% with severe LVH.  Diffuse global hypokinesis.  Mild aortic and mitral regurgitation.  Mild left and right atrial dilation.  Marland Kitchen VASECTOMY  1989    Current Meds  Medication Sig  . allopurinol (ZYLOPRIM) 300 MG tablet Take 150 mg by mouth daily.   . metoprolol succinate (TOPROL-XL) 50 MG 24 hr tablet Take 1 tablet (50 mg total) by mouth 2 (two) times daily.  . Multiple Vitamin (MULTIVITAMIN) capsule Take 1 capsule by mouth daily.  . rivaroxaban (XARELTO) 20 MG TABS tablet Take 1 tablet (20 mg total) by mouth daily with supper.  . sertraline (ZOLOFT) 100 MG tablet Take 50 mg by mouth daily.   . tadalafil (CIALIS) 5 MG tablet Take 1 tablet by mouth daily as needed for erectile dysfunction.     No Known Allergies  Social History   Tobacco Use  . Smoking status: Never Smoker  . Smokeless tobacco: Never Used  Substance Use Topics  . Alcohol use: Yes    Comment: moderate   . Drug use: Never   Social History   Social History Narrative   He is married now for 4 years (remarried).  He has 1 child (presumably from previous marriage -70 years old).   He lives with his wife.  He is an avid Chief Executive Officer.   He is a retired Producer, television/film/video for [employer].  (He has a BA in American studies at [employer].)   Never smoked.  Drinks 5-7 alcoholic beverages a week usually beer or hard cider.  May be occasionally a mixed drink.   He is quite active exercise at least 4 days a week for 30 minutes at a time.       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  He enjoys walking on both treadmill and in the community.  He plays golf routinely.    family history is not on file.  Wt Readings from Last 3 Encounters:  01/19/18 233 lb 9.6 oz (106 kg)  12/21/17 231 lb (104.8 kg)  12/14/17 225 lb (102.1 kg)    PHYSICAL EXAM BP 130/70   Pulse (!) 54   Ht 5' 10.5" (1.791 m)   Wt 233 lb 9.6 oz (106 kg)    BMI 33.04 kg/m  --Actually, on my exam, his heart rate was back down in the 80s. Physical Exam  Constitutional: He is oriented to person, place, and time. He appears well-developed and well-nourished. No distress.  Healthy-appearing.  Well-groomed  HENT:  Head: Normocephalic and atraumatic.  Mouth/Throat: Oropharynx is clear and moist.  Eyes: Conjunctivae and EOM are normal. No scleral icterus.  Neck: Normal range of motion. Neck supple. No hepatojugular reflux and no JVD present. Carotid bruit is not present. No thyromegaly present.  Cardiovascular: Normal rate, normal heart sounds, intact distal pulses and normal pulses. An irregularly irregular rhythm present. PMI is not displaced. Exam reveals no gallop and no friction rub.  No murmur heard. Pulmonary/Chest: Effort normal and breath sounds normal. No respiratory distress. He has no wheezes. He has no rales.  Abdominal: Soft. Bowel sounds are normal. He exhibits no  distension. There is no tenderness. There is no rebound.  No HSM  Musculoskeletal: Normal range of motion. He exhibits no edema.  Neurological: He is alert and oriented to person, place, and time. No cranial nerve deficit.  Psychiatric: He has a normal mood and affect. His behavior is normal. Judgment and thought content normal.  Vitals reviewed.    Adult ECG Report  Rate: 54;  Rhythm: sinus bradycardia and ~Incomplete RBBB.  Left atrial enlargement.  Otherwise normal axis, intervals and durations.;   Narrative Interpretation: Sinus bradycardia has replaced A. fib.  Other studies Reviewed: Additional studies/ records that were reviewed today include:  Recent Labs: From PCP visit on 10/22/2017:  Sodium 140, potassium 4.3, chloride 107, bicarb 27, BUN 23, creatinine 1.53.  Glucose 117.  Free T4 0.77 with a TSH of 3.12.  ASSESSMENT / PLAN: Problem List Items Addressed This Visit    Abnormal cardiac CT angiography    Interesting, with a low EF that there is only one  vessel disease.  Not sure how much PCI of the circumflex would benefit, but certainly would not hurt him.  He is clearly FFR positive.  Question if this could be related to the new onset of A. fib. Plan: Left heart catheterization with coronary angiography and possible PCI. Consent discussion performed (see below).  Preop labs drawn today. We will need to hold Xarelto preop.  Will need to be second or third case in the morning, for hydration.  Will then need to stay overnight for hydration.  No same-day PCI.      Relevant Orders   EKG 16-XWRU   Basic metabolic panel (Completed)   CBC (Completed)   LEFT HEART CATHETERIZATION WITH CORONARY ANGIOGRAM   Dilated cardiomyopathy ([place])    Unexpectedly, his EF was quite reduced but no regional wall motion normality.  I suspect this could be all related to his A. fib, but certainly with evidence of severe circumflex disease, warrants invasive ischemic evaluation and possible PCI.  Currently on Toprol.  Would need to ensure stable renal function post cath and then potentially consider ARB.  Euvolemic so no requirement for diuretic.  Reassess echo in roughly 3 months post cath/PCI.      Relevant Medications   aspirin EC 81 MG tablet   DOE (dyspnea on exertion) - Primary (Chronic)    Not sure if this is related to A. fib or CAD or both.  Clearly he is symptomatic with A. fib, and partly because of the CAD but also reduced EF. He does appear to be euvolemic today.  Would like to hold off on diuretic since he really has solitary kidney.      Relevant Orders   EKG 04-VWUJ   Basic metabolic panel (Completed)   CBC (Completed)   LEFT HEART CATHETERIZATION WITH CORONARY ANGIOGRAM   Essential hypertension (Chronic)    When we saw the reduced EF, I switched him from diltiazem to metoprolol.  Blood pressures pretty well controlled.  With reduced EF, will need to consider ACE inhibitor/ARB --> will restart post cath.      Relevant Medications    aspirin EC 81 MG tablet   Persistent atrial fibrillation:  CHA2DS2-VASc Score 4 (Age, CAD, HTN, CHF) (Chronic)    He had pretty long-standing episode of A. fib where the monitor.  Clearly persistent A. fib.  Seems to do well rate controlled now on beta-blocker.  On Xarelto for anticoagulation.  (Xarelto will be held for cath).  Since he was quite  symptomatic, would need to consider the possibility of antiarrhythmic if he has recurrence. Potentially could be related to circumflex disease that appears to be relatively new lesion. This may be the cause for his reduced pump function.  We will need to reassess his EF following cath roughly 3 months to better assess EF while not in A. fib.      Relevant Medications   aspirin EC 81 MG tablet   Other Relevant Orders   EKG 12-Lead   Solitary kidney    Solitary kidney with borderline renal function.  In order to preserve function, we will pre-hydrate him for cath and then keep him overnight post-cath for hydration.       Other Visit Diagnoses    Pre-op testing       Relevant Orders   EKG 09-BDZH   Basic metabolic panel (Completed)   CBC (Completed)      I spent a total of 45 minutes with the patient and chart review. >  50% of the time was spent in direct patient consultation.  Multiple studies were reviewed.  We then discussed reduced EF and the ramifications of that as well as the highly positive CT scan and recommendations for cardiac catheterization.  Performing MD:  Glenetta Hew, MD, M.D., M.S.  Procedure:  LEFT HEART CATHETERIZATION WITH NATIVE CORONARY ANGIOGRAPHY and possible PERCUTANEOUS CORONARY INTERVENTION.  The procedure with Risks/Benefits/Alternatives and Indications was reviewed with the patient and his wife..  All questions were answered.    Risks / Complications include, but not limited to: Death, MI, CVA/TIA, VF/VT (with defibrillation), Bradycardia (need for temporary pacer placement), contrast induced nephropathy,  bleeding / bruising / hematoma / pseudoaneurysm, vascular or coronary injury (with possible emergent CT or Vascular Surgery), adverse medication reactions, infection.  Additional risks involving the use of radiation with the possibility of radiation burns and cancer were explained in detail.  The patient (and wife) voice understanding and agree to proceed.     Current medicines are reviewed at length with the patient today.  (+/- concerns) n/a The following changes have been made:  see below  Patient Instructions  Medication Instructions:  NO T NEEDED SEE [place] CATH  If you need a refill on your cardiac medications before your next appointment, please call your pharmacy.   Lab work: CBC BMP If you have labs (blood work) drawn today and your tests are completely normal, you will receive your results only by: Marland Kitchen MyChart Message (if you have MyChart) OR . A paper copy in the mail If you have any lab test that is abnormal or we need to change your treatment, we will call you to review the results.  Testing/Procedures:  [place] 9 ,2019  ARRIVE  9AM Your physician has requested that you have a cardiac catheterization.    Follow-Up: At [facility], you and your health needs are our priority.  As part of our continuing mission to provide you with exceptional heart care, we have created designated Provider Care Teams.  These Care Teams include your primary Cardiologist (physician) and Advanced Practice Providers (APPs -  Physician Assistants and Nurse Practitioners) who all work together to provide you with the care you need, when you need it. . Your physician recommends that you schedule a follow-up appointment in 3 to  [place].  Studies Ordered:    Orders Placed This Encounter  Procedures  . Basic metabolic panel  . CBC  . EKG 12-Lead  .  LEFT HEART CATHETERIZATION WITH CORONARY ANGIOGRAM   follow-up appointment in 2 MONTHS WITH DR  Caryssa Elzey.    Glenetta Hew, M.D., M.S. Interventional Cardiologist   Pager # [phone] Phone # [phone] [address] [place], [place] [address]   Thank you for choosing Heartcare at [facility]!!

## 2018-01-19 NOTE — Progress Notes (Signed)
PCP: Lajean Manes, MD  Clinic Note: No chief complaint on file.   HPI: Bruce Yoder is a 72 y.o. male who is being seen today for the f/u evaluation of ABNORMAL CORONARY CTA ordered as part of evaluation for ATRIAL FIBRILLATION at the request of Lajean Manes, MD.  Jacorey Donaway was seen for initial consult 10/26/17 -- then seen in [facility].  Recent Hospitalizations: None  Studies Personally Reviewed - (if available, images/films reviewed: From Epic Chart or Care Everywhere)  Holter Monitor 11/08/17: Underlying rhythm is A. fib with normal rapid response.  Rare PVCs noted one 7 beat episode of bigeminy.  Echo 10/2017: EF 35 -40% with severe LVH.  Diffuse global hypokinesis.  Mild aortic and mitral regurgitation.  Mild left and right atrial dilation.  DCCV 12/14/17:  Cor CTA -Coronary Calcium Score 0.  LAD mild 25-49% stenosis.  LCx proximal <25% -large caliber OM proximal 70-99% stenosis.  Ramus intermedius mild plaque, RCA proximal 25-49%) FFR -Left Yoder 0.97.  LAD distal 0.86.  RCA distal 0.91.  LCx distal 0.81, but large caliber OM 0.7 consistent with severe stenosis in the proximal portion. (With minimal disease elsewhere, would suggest that this may be progression of disease)  Interval History: Bruce Yoder presents here today for follow-up after cardioversion but also to discuss results of his various studies.  He feels much better after having been successfully cardioverted.  He definitely noted that when he was in A. fib he was getting significant exertional dyspnea and may be some mild chest tightness.  Now he really only notes the dyspnea and no chest tightness with exertion.  He is still able to do his elliptical trainer and the stationary bike, but has been reluctant to go "all out ".  He has not been playing as much golf of late because of sciatica pain, and really he noted the dyspnea and the most when he was walking up hills playing golf.  Since starting Xarelto, he has not had  any melena, hematochezia, epistaxis or hematuria.  He has not felt any recurrence of his irregular heartbeat palpitation symptoms of atrial fib. No syncope/near syncope or TIA/amorous fugax.  No claudication  ROS: A comprehensive was performed.  Symptoms in HPI Review of Systems  Constitutional: Negative for malaise/fatigue.  HENT: Negative for congestion (Recovering from recent cold) and nosebleeds.   Respiratory: Negative for cough, sputum production, shortness of breath (Per HPI) and wheezing.   Gastrointestinal: Negative for constipation, heartburn, nausea and vomiting.  Genitourinary: Negative for dysuria and frequency.  Musculoskeletal: Positive for joint pain (Right hip pain).  Neurological: Negative for dizziness, focal weakness, weakness and headaches.  Endo/Heme/Allergies: Does not bruise/bleed easily.  Psychiatric/Behavioral: Negative.   All other systems reviewed and are negative.  I have reviewed and (if needed) personally updated the patient's problem list, medications, allergies, past medical and surgical history, social and family history.   Past Medical History:  Diagnosis Date  . Basal cell carcinoma (BCC) of back    Dr. Elvera Lennox  . CKD (chronic kidney disease) stage 3, GFR 30-59 ml/min (HCC)    Unilateral kidney  . Diverticulosis of colon    With polyps (Dr. Amedeo Plenty March 2017, followed by Dr. Glennon Hamilton)  . Erectile dysfunction   . Essential hypertension 2018  . Gout   . OSA on CPAP    [facility]  . Status post nephrectomy 1965  . Thrombocytopenia, unspecified (HCC)    Platelet clumping (pseudothrombocytopenia)    Past Surgical History:  Procedure Laterality Date  . APPENDECTOMY     During childhood  . CARDIOVERSION N/A 12/14/2017   Procedure: CARDIOVERSION;  Surgeon: Sueanne Margarita, MD;  Location: [facility] ENDOSCOPY;  Service: Cardiovascular;  Laterality: N/A;  . NEPHRECTOMY Right 1965  . TRANSTHORACIC ECHOCARDIOGRAM  10/2017    EF 35  -40% with severe LVH.  Diffuse global hypokinesis.  Mild aortic and mitral regurgitation.  Mild left and right atrial dilation.  Marland Kitchen VASECTOMY  1989    Current Meds  Medication Sig  . allopurinol (ZYLOPRIM) 300 MG tablet Take 150 mg by mouth daily.   . metoprolol succinate (TOPROL-XL) 50 MG 24 hr tablet Take 1 tablet (50 mg total) by mouth 2 (two) times daily.  . Multiple Vitamin (MULTIVITAMIN) capsule Take 1 capsule by mouth daily.  . rivaroxaban (XARELTO) 20 MG TABS tablet Take 1 tablet (20 mg total) by mouth daily with supper.  . sertraline (ZOLOFT) 100 MG tablet Take 50 mg by mouth daily.   . tadalafil (CIALIS) 5 MG tablet Take 1 tablet by mouth daily as needed for erectile dysfunction.     No Known Allergies  Social History   Tobacco Use  . Smoking status: Never Smoker  . Smokeless tobacco: Never Used  Substance Use Topics  . Alcohol use: Yes    Comment: moderate   . Drug use: Never   Social History   Social History Narrative   He is married now for 4 years (remarried).  He has 1 child (presumably from previous marriage -70 years old).   He lives with his wife.  He is an avid Chief Executive Officer.   He is a retired Producer, television/film/video for [employer].  (He has a BA in American studies at [employer].)   Never smoked.  Drinks 5-7 alcoholic beverages a week usually beer or hard cider.  May be occasionally a mixed drink.   He is quite active exercise at least 4 days a week for 30 minutes at a time.       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  He enjoys walking on both treadmill and in the community.  He plays golf routinely.    family history is not on file.  Wt Readings from Last 3 Encounters:  01/19/18 233 lb 9.6 oz (106 kg)  12/21/17 231 lb (104.8 kg)  12/14/17 225 lb (102.1 kg)    PHYSICAL EXAM BP 130/70   Pulse (!) 54   Ht 5' 10.5" (1.791 m)   Wt 233 lb 9.6 oz (106 kg)    BMI 33.04 kg/m  --Actually, on my exam, his heart rate was back down in the 80s. Physical Exam  Constitutional: He is oriented to person, place, and time. He appears well-developed and well-nourished. No distress.  Healthy-appearing.  Well-groomed  HENT:  Head: Normocephalic and atraumatic.  Mouth/Throat: Oropharynx is clear and moist.  Eyes: Conjunctivae and EOM are normal. No scleral icterus.  Neck: Normal range of motion. Neck supple. No hepatojugular reflux and no JVD present. Carotid bruit is not present. No thyromegaly present.  Cardiovascular: Normal rate, normal heart sounds, intact distal pulses and normal pulses. An irregularly irregular rhythm present. PMI is not displaced. Exam reveals no gallop and no friction rub.  No murmur heard. Pulmonary/Chest: Effort normal and breath sounds normal. No respiratory distress. He has no wheezes. He has no rales.  Abdominal: Soft. Bowel sounds are normal. He exhibits no  distension. There is no tenderness. There is no rebound.  No HSM  Musculoskeletal: Normal range of motion. He exhibits no edema.  Neurological: He is alert and oriented to person, place, and time. No cranial nerve deficit.  Psychiatric: He has a normal mood and affect. His behavior is normal. Judgment and thought content normal.  Vitals reviewed.    Adult ECG Report  Rate: 54;  Rhythm: sinus bradycardia and ~Incomplete RBBB.  Left atrial enlargement.  Otherwise normal axis, intervals and durations.;   Narrative Interpretation: Sinus bradycardia has replaced A. fib.  Other studies Reviewed: Additional studies/ records that were reviewed today include:  Recent Labs: From PCP visit on 10/22/2017:  Sodium 140, potassium 4.3, chloride 107, bicarb 27, BUN 23, creatinine 1.53.  Glucose 117.  Free T4 0.77 with a TSH of 3.12.  ASSESSMENT / PLAN: Problem List Items Addressed This Visit    Abnormal cardiac CT angiography    Interesting, with a low EF that there is only one  vessel disease.  Not sure how much PCI of the circumflex would benefit, but certainly would not hurt him.  He is clearly FFR positive.  Question if this could be related to the new onset of A. fib. Plan: Left heart catheterization with coronary angiography and possible PCI. Consent discussion performed (see below).  Preop labs drawn today. We will need to hold Xarelto preop.  Will need to be second or third case in the morning, for hydration.  Will then need to stay overnight for hydration.  No same-day PCI.      Relevant Orders   EKG 73-ZHGD   Basic metabolic panel (Completed)   CBC (Completed)   LEFT HEART CATHETERIZATION WITH CORONARY ANGIOGRAM   Dilated cardiomyopathy ([place])    Unexpectedly, his EF was quite reduced but no regional wall motion normality.  I suspect this could be all related to his A. fib, but certainly with evidence of severe circumflex disease, warrants invasive ischemic evaluation and possible PCI.  Currently on Toprol.  Would need to ensure stable renal function post cath and then potentially consider ARB.  Euvolemic so no requirement for diuretic.  Reassess echo in roughly 3 months post cath/PCI.      Relevant Medications   aspirin EC 81 MG tablet   DOE (dyspnea on exertion) - Primary (Chronic)    Not sure if this is related to A. fib or CAD or both.  Clearly he is symptomatic with A. fib, and partly because of the CAD but also reduced EF. He does appear to be euvolemic today.  Would like to hold off on diuretic since he really has solitary kidney.      Relevant Orders   EKG 92-EQAS   Basic metabolic panel (Completed)   CBC (Completed)   LEFT HEART CATHETERIZATION WITH CORONARY ANGIOGRAM   Essential hypertension (Chronic)    When we saw the reduced EF, I switched him from diltiazem to metoprolol.  Blood pressures pretty well controlled.  With reduced EF, will need to consider ACE inhibitor/ARB --> will restart post cath.      Relevant Medications    aspirin EC 81 MG tablet   Persistent atrial fibrillation:  CHA2DS2-VASc Score 4 (Age, CAD, HTN, CHF) (Chronic)    He had pretty long-standing episode of A. fib where the monitor.  Clearly persistent A. fib.  Seems to do well rate controlled now on beta-blocker.  On Xarelto for anticoagulation.  (Xarelto will be held for cath).  Since he was quite  symptomatic, would need to consider the possibility of antiarrhythmic if he has recurrence. Potentially could be related to circumflex disease that appears to be relatively new lesion. This may be the cause for his reduced pump function.  We will need to reassess his EF following cath roughly 3 months to better assess EF while not in A. fib.      Relevant Medications   aspirin EC 81 MG tablet   Other Relevant Orders   EKG 12-Lead   Solitary kidney    Solitary kidney with borderline renal function.  In order to preserve function, we will pre-hydrate him for cath and then keep him overnight post-cath for hydration.       Other Visit Diagnoses    Pre-op testing       Relevant Orders   EKG 83-MHDQ   Basic metabolic panel (Completed)   CBC (Completed)      I spent a total of 45 minutes with the patient and chart review. >  50% of the time was spent in direct patient consultation.  Multiple studies were reviewed.  We then discussed reduced EF and the ramifications of that as well as the highly positive CT scan and recommendations for cardiac catheterization.  Performing MD:  Glenetta Hew, MD, M.D., M.S.  Procedure:  LEFT HEART CATHETERIZATION WITH NATIVE CORONARY ANGIOGRAPHY and possible PERCUTANEOUS CORONARY INTERVENTION.  The procedure with Risks/Benefits/Alternatives and Indications was reviewed with the patient and his wife..  All questions were answered.    Risks / Complications include, but not limited to: Death, MI, CVA/TIA, VF/VT (with defibrillation), Bradycardia (need for temporary pacer placement), contrast induced nephropathy,  bleeding / bruising / hematoma / pseudoaneurysm, vascular or coronary injury (with possible emergent CT or Vascular Surgery), adverse medication reactions, infection.  Additional risks involving the use of radiation with the possibility of radiation burns and cancer were explained in detail.  The patient (and wife) voice understanding and agree to proceed.     Current medicines are reviewed at length with the patient today.  (+/- concerns) n/a The following changes have been made:  see below  Patient Instructions  Medication Instructions:  NO T NEEDED SEE [place] CATH  If you need a refill on your cardiac medications before your next appointment, please call your pharmacy.   Lab work: CBC BMP If you have labs (blood work) drawn today and your tests are completely normal, you will receive your results only by: Marland Kitchen MyChart Message (if you have MyChart) OR . A paper copy in the mail If you have any lab test that is abnormal or we need to change your treatment, we will call you to review the results.  Testing/Procedures:  [place] 9 ,2019  ARRIVE  9AM Your physician has requested that you have a cardiac catheterization.    Follow-Up: At [facility], you and your health needs are our priority.  As part of our continuing mission to provide you with exceptional heart care, we have created designated Provider Care Teams.  These Care Teams include your primary Cardiologist (physician) and Advanced Practice Providers (APPs -  Physician Assistants and Nurse Practitioners) who all work together to provide you with the care you need, when you need it. . Your physician recommends that you schedule a follow-up appointment in 3 to  [place].  Studies Ordered:    Orders Placed This Encounter  Procedures  . Basic metabolic panel  . CBC  . EKG 12-Lead  .  LEFT HEART CATHETERIZATION WITH CORONARY ANGIOGRAM   follow-up appointment in 2 MONTHS WITH DR  .    Glenetta Hew, M.D., M.S. Interventional Cardiologist   Pager # [phone] Phone # [phone] [address] [place], [place] [address]   Thank you for choosing Heartcare at [facility]!!

## 2018-01-20 LAB — BASIC METABOLIC PANEL
BUN/Creatinine Ratio: 17 (ref 10–24)
BUN: 25 mg/dL (ref 8–27)
CO2: 24 mmol/L (ref 20–29)
Calcium: 9.7 mg/dL (ref 8.6–10.2)
Chloride: 103 mmol/L (ref 96–106)
Creatinine, Ser: 1.44 mg/dL — ABNORMAL HIGH (ref 0.76–1.27)
GFR calc Af Amer: 56 mL/min/{1.73_m2} — ABNORMAL LOW (ref >59)
GFR calc non Af Amer: 49 mL/min/{1.73_m2} — ABNORMAL LOW (ref >59)
Glucose: 93 mg/dL (ref 65–99)
Potassium: 4.3 mmol/L (ref 3.5–5.2)
Sodium: 142 mmol/L (ref 134–144)

## 2018-01-20 LAB — CBC
Hematocrit: 42.6 % (ref 37.5–51.0)
Hemoglobin: 14.9 g/dL (ref 13.0–17.7)
MCH: 29.6 pg (ref 26.6–33.0)
MCHC: 35 g/dL (ref 31.5–35.7)
MCV: 85 fL (ref 79–97)
Platelets: 110 10*3/uL — ABNORMAL LOW (ref 150–450)
RBC: 5.04 x10E6/uL (ref 4.14–5.80)
RDW: 14.3 % (ref 12.3–15.4)
WBC: 6.2 10*3/uL (ref 3.4–10.8)

## 2018-01-21 ENCOUNTER — Encounter: Payer: Self-pay | Admitting: Cardiology

## 2018-01-21 DIAGNOSIS — IMO0002 Reserved for concepts with insufficient information to code with codable children: Secondary | ICD-10-CM | POA: Insufficient documentation

## 2018-01-21 DIAGNOSIS — Q600 Renal agenesis, unilateral: Secondary | ICD-10-CM | POA: Insufficient documentation

## 2018-01-21 DIAGNOSIS — I420 Dilated cardiomyopathy: Secondary | ICD-10-CM | POA: Insufficient documentation

## 2018-01-21 NOTE — Assessment & Plan Note (Signed)
Unexpectedly, his EF was quite reduced but no regional wall motion normality.  I suspect this could be all related to his A. fib, but certainly with evidence of severe circumflex disease, warrants invasive ischemic evaluation and possible PCI.  Currently on Toprol.  Would need to ensure stable renal function post cath and then potentially consider ARB.  Euvolemic so no requirement for diuretic.  Reassess echo in roughly 3 months post cath/PCI.

## 2018-01-21 NOTE — Assessment & Plan Note (Signed)
He had pretty long-standing episode of A. fib where the monitor.  Clearly persistent A. fib.  Seems to do well rate controlled now on beta-blocker.  On Xarelto for anticoagulation.  (Xarelto will be held for cath).  Since he was quite symptomatic, would need to consider the possibility of antiarrhythmic if he has recurrence. Potentially could be related to circumflex disease that appears to be relatively new lesion. This may be the cause for his reduced pump function.  We will need to reassess his EF following cath roughly 3 months to better assess EF while not in A. fib.

## 2018-01-21 NOTE — Assessment & Plan Note (Signed)
When we saw the reduced EF, I switched him from diltiazem to metoprolol.  Blood pressures pretty well controlled.  With reduced EF, will need to consider ACE inhibitor/ARB --> will restart post cath.

## 2018-01-21 NOTE — Assessment & Plan Note (Signed)
Not sure if this is related to A. fib or CAD or both.  Clearly he is symptomatic with A. fib, and partly because of the CAD but also reduced EF. He does appear to be euvolemic today.  Would like to hold off on diuretic since he really has solitary kidney.

## 2018-01-21 NOTE — Assessment & Plan Note (Addendum)
Interesting, with a low EF that there is only one vessel disease.  Not sure how much PCI of the circumflex would benefit, but certainly would not hurt him.  He is clearly FFR positive.  Question if this could be related to the new onset of A. fib. Plan: Left heart catheterization with coronary angiography and possible PCI. Consent discussion performed (see below).  Preop labs drawn today. We will need to hold Xarelto preop.  Will need to be second or third case in the morning, for hydration.  Will then need to stay overnight for hydration.  No same-day PCI.

## 2018-01-21 NOTE — Assessment & Plan Note (Signed)
Solitary kidney with borderline renal function.  In order to preserve function, we will pre-hydrate him for cath and then keep him overnight post-cath for hydration.

## 2018-01-26 ENCOUNTER — Telehealth: Payer: Self-pay | Admitting: *Deleted

## 2018-01-26 ENCOUNTER — Other Ambulatory Visit: Payer: Self-pay | Admitting: *Deleted

## 2018-01-26 DIAGNOSIS — R0609 Other forms of dyspnea: Secondary | ICD-10-CM

## 2018-01-26 DIAGNOSIS — R931 Abnormal findings on diagnostic imaging of heart and coronary circulation: Secondary | ICD-10-CM

## 2018-01-26 DIAGNOSIS — Z01818 Encounter for other preprocedural examination: Secondary | ICD-10-CM

## 2018-01-26 NOTE — Telephone Encounter (Signed)
-----   Message from Leonie Man, MD sent at 01/21/2018 10:58 AM EST ----- Roughly stable labs.  Okay for cardiac cath.  Will need precath hydration, and overnight hospital stay post cath.  Glenetta Hew, MD

## 2018-01-26 NOTE — Telephone Encounter (Signed)
LEFT DETAIL MESSAGE OF LABS FOR PATIENT PER DPR . ANY QUESTION MAY CALL BACK

## 2018-01-26 NOTE — Telephone Encounter (Signed)
° ° °  Please return call to patient °

## 2018-01-26 NOTE — Telephone Encounter (Signed)
LEFT TO CALL BACK - RESULT OF LABS

## 2018-01-27 ENCOUNTER — Telehealth: Payer: Self-pay | Admitting: *Deleted

## 2018-01-27 NOTE — Telephone Encounter (Addendum)
Pt contacted pre-catheterization scheduled at [facility] for: Monday January 31, 2018 12 noon Verified arrival time and place: [place] Entrance A at: 7 AM for pre-procedure hydration. Plan is to keep patient overnight for post procedure hydration.  No solid food after midnight prior to cath, clear liquids until 5 AM day of procedure.  Hold: Xarelto -none Sunday January 30, 2018 until post procedure  Except hold medications AM meds can be  taken pre-cath with sip of water including: ASA 81 mg  Confirm patient has responsible person to drive home post procedure and for 24 hours after you arrive home. Plan is to keep  patient overnight for post procedure hydration.  Left detailed message with instructions at phone number listed. I discussed instructions with patient, he verbalized understanding, thanked me for call.

## 2018-01-31 ENCOUNTER — Other Ambulatory Visit: Payer: Self-pay

## 2018-01-31 ENCOUNTER — Encounter (HOSPITAL_COMMUNITY): Payer: Self-pay | Admitting: Cardiology

## 2018-01-31 ENCOUNTER — Ambulatory Visit (HOSPITAL_COMMUNITY)
Admission: RE | Admit: 2018-01-31 | Discharge: 2018-02-01 | Disposition: A | Discharge status: Home / Self Care | Payer: Medicare Other | Attending: Cardiology | Admitting: Cardiology

## 2018-01-31 ENCOUNTER — Encounter (HOSPITAL_COMMUNITY)
Admission: RE | Disposition: A | Discharge status: Home / Self Care | Payer: Self-pay | Source: Home / Self Care | Attending: Cardiology

## 2018-01-31 DIAGNOSIS — N529 Male erectile dysfunction, unspecified: Secondary | ICD-10-CM | POA: Insufficient documentation

## 2018-01-31 DIAGNOSIS — N183 Chronic kidney disease, stage 3 (moderate): Secondary | ICD-10-CM | POA: Diagnosis not present

## 2018-01-31 DIAGNOSIS — I25119 Atherosclerotic heart disease of native coronary artery with unspecified angina pectoris: Secondary | ICD-10-CM | POA: Diagnosis not present

## 2018-01-31 DIAGNOSIS — G4733 Obstructive sleep apnea (adult) (pediatric): Secondary | ICD-10-CM | POA: Diagnosis not present

## 2018-01-31 DIAGNOSIS — Z905 Acquired absence of kidney: Secondary | ICD-10-CM | POA: Insufficient documentation

## 2018-01-31 DIAGNOSIS — I420 Dilated cardiomyopathy: Secondary | ICD-10-CM | POA: Diagnosis present

## 2018-01-31 DIAGNOSIS — R931 Abnormal findings on diagnostic imaging of heart and coronary circulation: Secondary | ICD-10-CM | POA: Insufficient documentation

## 2018-01-31 DIAGNOSIS — Z7901 Long term (current) use of anticoagulants: Secondary | ICD-10-CM | POA: Diagnosis not present

## 2018-01-31 DIAGNOSIS — Z79899 Other long term (current) drug therapy: Secondary | ICD-10-CM | POA: Insufficient documentation

## 2018-01-31 DIAGNOSIS — I129 Hypertensive chronic kidney disease with stage 1 through stage 4 chronic kidney disease, or unspecified chronic kidney disease: Secondary | ICD-10-CM | POA: Diagnosis not present

## 2018-01-31 DIAGNOSIS — Z01818 Encounter for other preprocedural examination: Secondary | ICD-10-CM

## 2018-01-31 DIAGNOSIS — I2510 Atherosclerotic heart disease of native coronary artery without angina pectoris: Secondary | ICD-10-CM | POA: Insufficient documentation

## 2018-01-31 DIAGNOSIS — I480 Paroxysmal atrial fibrillation: Secondary | ICD-10-CM | POA: Insufficient documentation

## 2018-01-31 DIAGNOSIS — I429 Cardiomyopathy, unspecified: Secondary | ICD-10-CM | POA: Insufficient documentation

## 2018-01-31 DIAGNOSIS — R0609 Other forms of dyspnea: Secondary | ICD-10-CM | POA: Insufficient documentation

## 2018-01-31 DIAGNOSIS — Z9852 Vasectomy status: Secondary | ICD-10-CM | POA: Diagnosis not present

## 2018-01-31 DIAGNOSIS — M109 Gout, unspecified: Secondary | ICD-10-CM | POA: Diagnosis not present

## 2018-01-31 DIAGNOSIS — Z9889 Other specified postprocedural states: Secondary | ICD-10-CM | POA: Insufficient documentation

## 2018-01-31 HISTORY — DX: Unspecified atrial fibrillation: I48.91

## 2018-01-31 HISTORY — PX: LEFT HEART CATH AND CORONARY ANGIOGRAPHY: CATH118249

## 2018-01-31 HISTORY — DX: Atherosclerotic heart disease of native coronary artery without angina pectoris: I25.10

## 2018-01-31 HISTORY — PX: INTRAVASCULAR PRESSURE WIRE/FFR STUDY: CATH118243

## 2018-01-31 HISTORY — DX: Dilated cardiomyopathy: I42.0

## 2018-01-31 LAB — POCT ACTIVATED CLOTTING TIME: Activated Clotting Time: 257 seconds

## 2018-01-31 SURGERY — LEFT HEART CATH AND CORONARY ANGIOGRAPHY
Anesthesia: LOCAL

## 2018-01-31 MED ORDER — SODIUM CHLORIDE 0.9% FLUSH
3.0000 mL | INTRAVENOUS | Status: DC | PRN
  Administered 2018-02-01: 3 mL via INTRAVENOUS
  Filled 2018-01-31: qty 3

## 2018-01-31 MED ORDER — ONDANSETRON HCL 4 MG/2ML IJ SOLN: 4.0000 mg | Freq: Four times a day (QID) | INTRAMUSCULAR | Status: DC | PRN

## 2018-01-31 MED ORDER — ADENOSINE (DIAGNOSTIC) 140MCG/KG/MIN
INTRAVENOUS | Status: DC | PRN
  Administered 2018-01-31: 140 ug/kg/min via INTRAVENOUS

## 2018-01-31 MED ORDER — HEPARIN SODIUM (PORCINE) 1000 UNIT/ML IJ SOLN
INTRAMUSCULAR | Status: DC | PRN
  Administered 2018-01-31 (×2): 5000 [IU] via INTRAVENOUS

## 2018-01-31 MED ORDER — ACETAMINOPHEN 325 MG PO TABS: 650.0000 mg | ORAL_TABLET | ORAL | Status: DC | PRN

## 2018-01-31 MED ORDER — RIVAROXABAN 20 MG PO TABS
20.0000 mg | ORAL_TABLET | Freq: Every day | ORAL | Status: DC
  Administered 2018-01-31: 21:00:00 20 mg via ORAL
  Filled 2018-01-31: qty 1

## 2018-01-31 MED ORDER — HEPARIN SODIUM (PORCINE) 1000 UNIT/ML IJ SOLN
INTRAMUSCULAR | Status: AC
  Filled 2018-01-31: qty 1

## 2018-01-31 MED ORDER — LIDOCAINE HCL (PF) 1 % IJ SOLN
INTRAMUSCULAR | Status: DC | PRN
  Administered 2018-01-31: 2 mL

## 2018-01-31 MED ORDER — SODIUM CHLORIDE 0.9 % IV SOLN: INTRAVENOUS | Status: AC

## 2018-01-31 MED ORDER — SODIUM CHLORIDE 0.9% FLUSH
3.0000 mL | Freq: Two times a day (BID) | INTRAVENOUS | Status: DC
  Administered 2018-01-31 – 2018-02-01 (×2): 3 mL via INTRAVENOUS

## 2018-01-31 MED ORDER — SODIUM CHLORIDE 0.9 % IV SOLN
INTRAVENOUS | Status: DC
  Administered 2018-01-31: 08:00:00 via INTRAVENOUS

## 2018-01-31 MED ORDER — LABETALOL HCL 5 MG/ML IV SOLN: 10.0000 mg | INTRAVENOUS | Status: AC | PRN

## 2018-01-31 MED ORDER — HYDRALAZINE HCL 20 MG/ML IJ SOLN: 5.0000 mg | INTRAMUSCULAR | Status: AC | PRN

## 2018-01-31 MED ORDER — METOPROLOL SUCCINATE ER 50 MG PO TB24
50.0000 mg | ORAL_TABLET | Freq: Two times a day (BID) | ORAL | Status: DC
  Administered 2018-02-01: 10:00:00 50 mg via ORAL
  Filled 2018-01-31: qty 1

## 2018-01-31 MED ORDER — HEPARIN (PORCINE) IN NACL 1000-0.9 UT/500ML-% IV SOLN
INTRAVENOUS | Status: DC | PRN
  Administered 2018-01-31 (×2): 500 mL

## 2018-01-31 MED ORDER — MIDAZOLAM HCL 2 MG/2ML IJ SOLN
INTRAMUSCULAR | Status: AC
  Filled 2018-01-31: qty 2

## 2018-01-31 MED ORDER — LIDOCAINE HCL (PF) 1 % IJ SOLN
INTRAMUSCULAR | Status: AC
  Filled 2018-01-31: qty 30

## 2018-01-31 MED ORDER — SODIUM CHLORIDE 0.9% FLUSH: 3.0000 mL | INTRAVENOUS | Status: DC | PRN

## 2018-01-31 MED ORDER — ASPIRIN EC 81 MG PO TBEC
81.0000 mg | DELAYED_RELEASE_TABLET | Freq: Every day | ORAL | Status: DC
  Administered 2018-02-01: 10:00:00 81 mg via ORAL
  Filled 2018-01-31: qty 1

## 2018-01-31 MED ORDER — VERAPAMIL HCL 2.5 MG/ML IV SOLN
INTRAVENOUS | Status: DC | PRN
  Administered 2018-01-31: 10 mL via INTRA_ARTERIAL

## 2018-01-31 MED ORDER — SERTRALINE HCL 50 MG PO TABS
50.0000 mg | ORAL_TABLET | Freq: Every day | ORAL | Status: DC
  Administered 2018-02-01: 50 mg via ORAL
  Filled 2018-01-31: qty 1

## 2018-01-31 MED ORDER — FENTANYL CITRATE (PF) 100 MCG/2ML IJ SOLN
INTRAMUSCULAR | Status: DC | PRN
  Administered 2018-01-31: 50 ug via INTRAVENOUS

## 2018-01-31 MED ORDER — MIDAZOLAM HCL 2 MG/2ML IJ SOLN
INTRAMUSCULAR | Status: DC | PRN
  Administered 2018-01-31: 2 mg via INTRAVENOUS

## 2018-01-31 MED ORDER — ADENOSINE 12 MG/4ML IV SOLN
INTRAVENOUS | Status: AC
  Filled 2018-01-31: qty 16

## 2018-01-31 MED ORDER — IOHEXOL 350 MG/ML SOLN
INTRAVENOUS | Status: DC | PRN
  Administered 2018-01-31: 80 mL

## 2018-01-31 MED ORDER — HEPARIN (PORCINE) IN NACL 1000-0.9 UT/500ML-% IV SOLN
INTRAVENOUS | Status: AC
  Filled 2018-01-31: qty 1000

## 2018-01-31 MED ORDER — VERAPAMIL HCL 2.5 MG/ML IV SOLN
INTRAVENOUS | Status: AC
  Filled 2018-01-31: qty 2

## 2018-01-31 MED ORDER — SODIUM CHLORIDE 0.9 % IV SOLN: 250.0000 mL | INTRAVENOUS | Status: DC | PRN

## 2018-01-31 MED ORDER — FENTANYL CITRATE (PF) 100 MCG/2ML IJ SOLN
INTRAMUSCULAR | Status: AC
  Filled 2018-01-31: qty 2

## 2018-01-31 SURGICAL SUPPLY — 16 items
CATH INFINITI 5 FR 3DRC (CATHETERS) ×2 IMPLANT
CATH INFINITI 5 FR JL3.5 (CATHETERS) ×2 IMPLANT
CATH INFINITI JR4 5F (CATHETERS) ×2 IMPLANT
CATH OPTITORQUE TIG 4.0 5F (CATHETERS) ×2 IMPLANT
CATH VISTA GUIDE 6FR XB3.5 (CATHETERS) ×2 IMPLANT
DEVICE RAD COMP TR BAND LRG (VASCULAR PRODUCTS) ×2 IMPLANT
GLIDESHEATH SLEND SS 6F .021 (SHEATH) ×2 IMPLANT
GUIDEWIRE INQWIRE 1.5J.035X260 (WIRE) ×1 IMPLANT
GUIDEWIRE PRESSURE COMET II (WIRE) ×2 IMPLANT
INQWIRE 1.5J .035X260CM (WIRE) ×2
KIT ESSENTIALS PG (KITS) ×2 IMPLANT
KIT HEART LEFT (KITS) ×2 IMPLANT
PACK CARDIAC CATHETERIZATION (CUSTOM PROCEDURE TRAY) ×2 IMPLANT
SHEATH PROBE COVER 6X72 (BAG) ×2 IMPLANT
TRANSDUCER W/STOPCOCK (MISCELLANEOUS) ×2 IMPLANT
TUBING CIL FLEX 10 FLL-RA (TUBING) ×2 IMPLANT

## 2018-01-31 NOTE — Progress Notes (Signed)
TR BAND REMOVAL  LOCATION:    right radial  DEFLATED PER PROTOCOL:    Yes.    TIME BAND OFF / DRESSING APPLIED:    1715   SITE UPON ARRIVAL:    Level 0  SITE AFTER BAND REMOVAL:    Level 0  CIRCULATION SENSATION AND MOVEMENT:    Within Normal Limits   Yes.    COMMENTS:   Tolerated procedure well 

## 2018-01-31 NOTE — Interval H&P Note (Signed)
History and Physical Interval Note:  01/31/2018 11:39 AM  [name]  has presented today for surgery, with the diagnosis of abnormal coronary CT angiogram, dyspnea -cardiomyopathy with new diagnosis of A. fib.    The various methods of treatment have been discussed with the patient and family. After consideration of risks, benefits and other options for treatment, the patient has consented to  Procedure(s): LEFT HEART CATH AND CORONARY ANGIOGRAPHY (N/A) with possible PERCUTANEOUS CORONARY INTERVENTION as a surgical intervention .  The patient's history has been reviewed, patient examined, no change in status, stable for surgery.  I have reviewed the patient's chart and labs.  Questions were answered to the patient's satisfaction.    Cath Lab Visit (complete for each Cath Lab visit)  Clinical Evaluation Leading to the Procedure:   ACS: No.  Non-ACS:    Anginal Classification: CCS II  Anti-ischemic medical therapy: Minimal Therapy (1 class of medications)  Non-Invasive Test Results: High-risk stress test findings: cardiac mortality >3%/year  Prior CABG: No previous CABG   Glenetta Hew

## 2018-02-01 ENCOUNTER — Other Ambulatory Visit: Payer: Self-pay | Admitting: Student

## 2018-02-01 ENCOUNTER — Encounter (HOSPITAL_COMMUNITY): Payer: Self-pay | Admitting: Student

## 2018-02-01 DIAGNOSIS — R0609 Other forms of dyspnea: Secondary | ICD-10-CM | POA: Diagnosis not present

## 2018-02-01 DIAGNOSIS — I4819 Other persistent atrial fibrillation: Secondary | ICD-10-CM

## 2018-02-01 DIAGNOSIS — I5022 Chronic systolic (congestive) heart failure: Secondary | ICD-10-CM

## 2018-02-01 DIAGNOSIS — M109 Gout, unspecified: Secondary | ICD-10-CM | POA: Diagnosis not present

## 2018-02-01 DIAGNOSIS — I420 Dilated cardiomyopathy: Secondary | ICD-10-CM

## 2018-02-01 DIAGNOSIS — G4733 Obstructive sleep apnea (adult) (pediatric): Secondary | ICD-10-CM | POA: Diagnosis not present

## 2018-02-01 DIAGNOSIS — R931 Abnormal findings on diagnostic imaging of heart and coronary circulation: Secondary | ICD-10-CM | POA: Diagnosis not present

## 2018-02-01 DIAGNOSIS — I2510 Atherosclerotic heart disease of native coronary artery without angina pectoris: Secondary | ICD-10-CM | POA: Diagnosis not present

## 2018-02-01 DIAGNOSIS — N529 Male erectile dysfunction, unspecified: Secondary | ICD-10-CM | POA: Diagnosis not present

## 2018-02-01 DIAGNOSIS — I480 Paroxysmal atrial fibrillation: Secondary | ICD-10-CM | POA: Diagnosis not present

## 2018-02-01 DIAGNOSIS — I429 Cardiomyopathy, unspecified: Secondary | ICD-10-CM | POA: Diagnosis not present

## 2018-02-01 DIAGNOSIS — N183 Chronic kidney disease, stage 3 unspecified: Secondary | ICD-10-CM

## 2018-02-01 DIAGNOSIS — I25119 Atherosclerotic heart disease of native coronary artery with unspecified angina pectoris: Secondary | ICD-10-CM | POA: Diagnosis not present

## 2018-02-01 DIAGNOSIS — I129 Hypertensive chronic kidney disease with stage 1 through stage 4 chronic kidney disease, or unspecified chronic kidney disease: Secondary | ICD-10-CM | POA: Diagnosis not present

## 2018-02-01 LAB — BASIC METABOLIC PANEL
Anion gap: 9 (ref 5–15)
BUN: 21 mg/dL (ref 8–23)
CO2: 26 mmol/L (ref 22–32)
Calcium: 9.2 mg/dL (ref 8.9–10.3)
Chloride: 104 mmol/L (ref 98–111)
Creatinine, Ser: 1.44 mg/dL — ABNORMAL HIGH (ref 0.61–1.24)
GFR calc Af Amer: 57 mL/min — ABNORMAL LOW (ref >60)
GFR calc non Af Amer: 49 mL/min — ABNORMAL LOW (ref >60)
Glucose, Bld: 95 mg/dL (ref 70–99)
Potassium: 4.3 mmol/L (ref 3.5–5.1)
Sodium: 139 mmol/L (ref 135–145)

## 2018-02-01 LAB — LIPID PANEL
Cholesterol: 142 mg/dL (ref 0–200)
HDL: 34 mg/dL — ABNORMAL LOW (ref >40)
LDL Cholesterol: 87 mg/dL (ref 0–99)
Total CHOL/HDL Ratio: 4.2 RATIO
Triglycerides: 105 mg/dL (ref <150)
VLDL: 21 mg/dL (ref 0–40)

## 2018-02-01 MED ORDER — LOSARTAN POTASSIUM 25 MG PO TABS: 25.0000 mg | ORAL_TABLET | Freq: Every day | ORAL | 2 refills | Status: DC

## 2018-02-01 MED ORDER — METOPROLOL SUCCINATE ER 25 MG PO TB24: 25.0000 mg | ORAL_TABLET | Freq: Two times a day (BID) | ORAL | 2 refills | Status: DC

## 2018-02-01 MED ORDER — METOPROLOL SUCCINATE ER 50 MG PO TB24: 50.0000 mg | ORAL_TABLET | Freq: Two times a day (BID) | ORAL | Status: DC

## 2018-02-01 MED ORDER — ATORVASTATIN CALCIUM 40 MG PO TABS: 40.0000 mg | ORAL_TABLET | Freq: Every day | ORAL | 2 refills | Status: DC

## 2018-02-01 NOTE — Discharge Instructions (Addendum)
Post Heart Catheterization: NO HEAVY LIFTING OR SEXUAL ACTIVITY X 7 DAYS. NO DRIVING X 2-3 DAYS. NO SOAKING BATHS, HOT TUBS, POOLS, ETC., X 7 DAYS.  Radial Site Care: Refer to this sheet in the next few weeks. These instructions provide you with information on caring for yourself after your procedure. Your caregiver may also give you more specific instructions. Your treatment has been planned according to current medical practices, but problems sometimes occur. Call your caregiver if you have any problems or questions after your procedure. HOME CARE INSTRUCTIONS  You may shower the day after the procedure.Remove the bandage (dressing) and gently wash the site with plain soap and water.Gently pat the site dry.   Do not apply powder or lotion to the site.   Do not submerge the affected site in water for 3 to 5 days.   Inspect the site at least twice daily.   Do not flex or bend the affected arm for 24 hours.   No lifting over 5 pounds (2.3 kg) for 5 days after your procedure.   Do not drive home if you are discharged the same day of the procedure. Have someone else drive you.  What to expect:  Any bruising will usually fade within 1 to 2 weeks.   Blood that collects in the tissue (hematoma) may be painful to the touch. It should usually decrease in size and tenderness within 1 to 2 weeks.  SEEK IMMEDIATE MEDICAL CARE IF:  You have unusual pain at the radial site.   You have redness, warmth, swelling, or pain at the radial site.   You have drainage (other than a small amount of blood on the dressing).   You have chills.   You have a fever or persistent symptoms for more than 72 hours.   You have a fever and your symptoms suddenly get worse.   Your arm becomes pale, cool, tingly, or numb.   You have heavy bleeding from the site. Hold pressure on the site.  _______________  Information on My Medicine - XARELTO (Rivaroxaban)  This medication education was reviewed with me  or my healthcare representative as part of my discharge preparation.  The pharmacist that spoke with me during my hospital stay was:  Einar Grad, [facility]  Why was Xarelto prescribed for you? Xarelto was prescribed for you to reduce the risk of a blood clot forming that can cause a stroke if you have a medical condition called atrial fibrillation (a type of irregular heartbeat).  What do you need to know about xarelto ? Take your Xarelto ONCE DAILY at the same time every day with your evening meal. If you have difficulty swallowing the tablet whole, you may crush it and mix in applesauce just prior to taking your dose.  Take Xarelto exactly as prescribed by your doctor and DO NOT stop taking Xarelto without talking to the doctor who prescribed the medication.  Stopping without other stroke prevention medication to take the place of Xarelto may increase your risk of developing a clot that causes a stroke.  Refill your prescription before you run out.  After discharge, you should have regular check-up appointments with your healthcare provider that is prescribing your Xarelto.  In the future your dose may need to be changed if your kidney function or weight changes by a significant amount.  What do you do if you miss a dose? If you are taking Xarelto ONCE DAILY and you miss a dose, take it as soon as  you remember on the same day then continue your regularly scheduled once daily regimen the next day. Do not take two doses of Xarelto at the same time or on the same day.   Important Safety Information A possible side effect of Xarelto is bleeding. You should call your healthcare provider right away if you experience any of the following: ? Bleeding from an injury or your nose that does not stop. ? Unusual colored urine (red or dark brown) or unusual colored stools (red or black). ? Unusual bruising for unknown reasons. ? A serious fall or if you hit your head (even if there is no  bleeding).  Some medicines may interact with Xarelto and might increase your risk of bleeding while on Xarelto. To help avoid this, consult your healthcare provider or pharmacist prior to using any new prescription or non-prescription medications, including herbals, vitamins, non-steroidal anti-inflammatory drugs (NSAIDs) and supplements.  This website has more information on Xarelto: [email or web address]

## 2018-02-01 NOTE — Progress Notes (Signed)
CARDIAC REHAB PHASE I   PRE:  Rate/Rhythm: 56 SB  BP:  Supine:   Sitting: 147/78  Standing:    SaO2:   MODE:  Ambulation: 600 ft   POST:  Rate/Rhythm: 72 SR  BP:  Supine:   Sitting: 162/81  Standing:    SaO2: 96%RA 0850-0937 Pt walked 600 ft on RA with steady gait. Slightly SOB but sats good and in NSR. Gave pt CHF booklet due to low EF and discussed signs/symptoms of CHF and importance of low sodium diet adhering to 2000 mg. Encouraged to start exercise slowly.    Graylon Good, RN BSN  02/01/2018 9:31 AM

## 2018-02-01 NOTE — Discharge Summary (Addendum)
The patient has been seen in conjunction with Bruce Rives, PA-C. All aspects of care have been considered and discussed. The patient has been personally interviewed, examined, and all clinical data has been reviewed.    Kidney function stable with creatinine of 1.44 mg/dL..  Add low-dose ARB therapy for BP, preservation of renal function, and as component of guideline directed therapy for LV systolic dysfunction.  Needs a basic metabolic panel 7 days after discharge to rule out hyperkalemia and deterioration in kidney function.  Continue beta-blocker therapy at current intensity, 50 mg twice daily.  Obtuse marginal branch was invasive FFR negative for significant obstruction.  Does have 40 to 60% lesion and guideline directed therapy for secondary cardiac prevention should be instituted, including high intensity statin therapy.  Will need a liver and lipid panel in 6 to 8 weeks.  Plan discharge today.   Discharge Summary    Patient ID: Bruce Yoder MRN: [id number]; DOB: [birth year]  Admit date: 01/31/2018 Discharge date: 02/01/2018  Primary Care Provider: Lajean Manes, MD  Primary Cardiologist: Bruce Hew, MD  Primary Electrophysiologist:  None   Discharge Diagnoses    Principal Problem:   Abnormal cardiac CT angiography Active Problems:   DOE (dyspnea on exertion)   Persistent atrial fibrillation:  CHA2DS2-VASc Score 4 (Age, CAD, HTN, CHF)   Dilated cardiomyopathy (Bruce Yoder)   Allergies No Known Allergies  Diagnostic Studies/Procedures    Left Heart Catheterization 01/31/2018:  Ost 2nd Mrg lesion is 55-60% stenosed. FFR 0.93 (not physiologically significant)  Prox LAD lesion is 25% stenosed.  LV end diastolic pressure is moderately elevated.   Summary:  Moderate single-vessel disease with FFR negative roughly 55 to 60% lesion in the proximal OM1.  Otherwise minimal disease.  Moderately elevated LVEDP.  Recommendation:  Overnight observation for  hydration due to solitary kidney with mild CKD.  Restart Xarelto tonight.  Otherwise continue home medications as directed.  Plan will be to reassess with echocardiogram in roughly 2 months to determine if this is atrial fibrillation related cardiomyopathy.   For now, would continue low-dose aspirin in the setting of mild CAD.  Would also recommend other risk factor modification with lipid management.  History of Present Illness     [name] is a 72 year-old male with a history of atrial fibrillation, hypertension, obstructive sleep apnea on CPAP, and CKD stage III with a solitary kidney. Patient was diagnosed with new onset atrial fibrillation at a routine follow-up with his PCP on 10/22/2017 and was referred to Cardiology. 48-Hour Holter Monitor in 10/2017 revealed underlying rhythm of atrial fibrillation with normal ventricular response and rare PVCs. Echocardiogram on 11/03/2017 showed LVEF of 35-40% with severe LVH.  Coronary CTA on 12/01/2017 showed a large caliber OM proximal 70-99% stenosis with FFR of 0.7 consistent with severe stenosis of proximal portion with minimal disease elsewhere. He underwent successful DCCV on 12/14/2017. Patient last saw Dr. Ellyn Yoder on 01/19/2018 for follow-up at which time he reported feeling much better since being cardioverted. However, he noted significant exertional dyspnea and mild chest tightness when he was in atrial fibrillation. Since being cardioverted, he denies any chest tightness but still notes dyspnea with exertion. Given his reduced LVEF and abnormal coronary CT, plan at that visit was to proceed with left heart catheterization with possible PCI.   Hospital Course     Consultants: None.  Bruce Yoder was admitted on 01/31/2018 for a planned left heart catheterization which showed moderate single-vessel disease with FFR negative (0.93)  and roughly 55-60% of lesion in proximal OM1 and otherwise minimal disease. Patient tolerated the procedure well.  He was admitted overnight for hydration due to solitary kidney with mild CKD. Patient's home Xarelto was started last night. Serum creatinine this morning was 1.44, which appears to be patient's baseline. Telemetry showed sinus bradycardia with heart rates in the high 40's to 50's. Patient's last dose of home Toprol was yesterday morning prior to catheterization. He is asymptomatic. Baseline heart rate appears to be in the 50's. Patient also had brief 7 beat run of SVT this morning while I was in patient's room. Patient completely asymptomatic during this episode.  - Continue Aspirin 81mg  daily in the setting of mild CAD.  - Will start Lipitor 40mg  daily for further risk factor modification. Will need repeat lipid panel and CMP in about 6 weeks.  - Will continue Toprol XL 50mg  twice daily. - Patient's BP elevated during hospital stay with systolic BP as high as 170'Y. Most recent BP 163/82. Patient reports systolic BP in the 174'B at home. Given patient's reduced LVEF, consider adding ACEi/ARB; however, patient also has CKD. Discussed with MD. Serum creatinine 1.44 (baseline) and potassium 4.3. Will add Losartan 25mg  daily. Will schedule Nurses Visit in 1 week to recheck blood pressure and renal function. - Will plan to repeat echocardiogram in about 2 months to determine if this is atrial fibrillation related cardiomyopathy.  - Patient has a follow-up appointment with Dr. Ellyn Yoder scheduled for 03/03/2018.  MD to see prior to discharge. Medications as below.   Discharge Vitals Blood pressure (!) 163/82, pulse (!) 50, temperature 97.6 F (36.4 C), temperature source Oral, resp. rate 20, height 5' 10.5" (1.791 m), weight 104 kg, SpO2 95 %.  Filed Weights   01/31/18 0724 02/01/18 0720  Weight: 104.3 kg 104 kg   General: Well developed, well nourished, male in no acute distress. Head: Normocephalic and atraumatic. Eyes: Anicteric sclera. No xanthomas. Lungs: No increased work of breathing. Clear  bilaterally to auscultation. Heart: Bradycardic with regular rhythm. Distinct S1 and S2. No murmurs, gallops, or rubs. Radial and distal pedal pulses 2+ and equal bilaterally. Right radial cath site soft with no induration, erythema, or ecchymoses.  Abdomen: Soft, non-distended, and non-tender. Bowel sounds are present. Msk: Normal strength and tone for age. Extremities: Trace pitting edema of bilateral lower extremities.   Skin:  Warm and dry. Neuro: Alert and oriented x3. Psych:  Good affect. Responds appropriately.  Labs & Radiologic Studies    CBC No results for input(s): WBC, NEUTROABS, HGB, HCT, MCV, PLT in the last 72 hours. Basic Metabolic Panel Recent Labs    02/01/18 0126  NA 139  K 4.3  CL 104  CO2 26  GLUCOSE 95  BUN 21  CREATININE 1.44*  CALCIUM 9.2   Liver Function Tests No results for input(s): AST, ALT, ALKPHOS, BILITOT, PROT, ALBUMIN in the last 72 hours. No results for input(s): LIPASE, AMYLASE in the last 72 hours. Cardiac Enzymes No results for input(s): CKTOTAL, CKMB, CKMBINDEX, TROPONINI in the last 72 hours. BNP Invalid input(s): POCBNP D-Dimer No results for input(s): DDIMER in the last 72 hours. Hemoglobin A1C No results for input(s): HGBA1C in the last 72 hours. Fasting Lipid Panel Recent Labs    02/01/18 0709  CHOL 142  HDL 34*  LDLCALC 87  TRIG 105  CHOLHDL 4.2   Thyroid Function Tests No results for input(s): TSH, T4TOTAL, T3FREE, THYROIDAB in the last 72 hours.  Invalid input(s): FREET3 _____________  Ct Coronary Morph W/cta Cor W/score W/ca W/cm &/or Wo/cm  Addendum Date: 01/12/2018   ADDENDUM REPORT: 01/12/2018 18:03 HISTORY: DOE , AFIB EXAM: Cardiac/Coronary  CT TECHNIQUE: The patient was scanned on a [employer]. PROTOCOL: A 120 kV prospective scan was triggered in the descending thoracic aorta at 111 HU's. Axial non-contrast 3 mm slices were carried out through the heart. The data set was analyzed on a dedicated work  station and scored using the [place]. Gantry rotation speed was 250 msecs and collimation was .6 mm. No beta blockade and 0.8 mg of sl NTG was given. The 3D data set was reconstructed in 5% intervals of the 67-82 % of the R-R cycle. Diastolic phases were analyzed on a dedicated work station using MPR, MIP and VRT modes. The patient received 80 cc of contrast. FINDINGS: Coronary calcium score: The patient's coronary artery calcium score is 0, which places the patient in the 0 percentile. Coronary arteries: Normal coronary origins.  Right dominance. Right Coronary Artery: Mild tubular atherosclerotic plaque in the proximal RCA (25-49% stenosis). Patent and normal caliber PDA and posterolateral branches. Left Main Coronary Artery: No detectable plaque or stenosis. Left Anterior Descending Coronary Artery: Mild atherosclerotic plaque in the mid LAD (25-49% stenosis). Patent diagonal branches. Left Circumflex Artery: Minimal plaque in the proximal circumflex (<25% stenosis). Severe atherosclerotic plaque in the large caliber OM, with 70-99% stenosis (See series 1000 for image.) The distal circumflex is large caliber with minimal scattered plaque. High OM1 vs Ramus Intermedius: Patent without detectable plaque or stenosis. Medium caliber. Aorta: 39 mm in maximal dimension at the mid ascending aorta (at the level of the PA bifurcation) double oblique measurements (series 1001). No calcifications. No dissection. Aortic Valve: No calcifications. LV: Grossly normal left ventricular function. Other findings: Normal pulmonary vein drainage into the left atrium. Normal left atrial appendage without a thrombus. Normal size of the pulmonary artery. Possible patent foramen ovale. IMPRESSION: 1. Coronary calcium score of 0. This was 0 percentile for age and sex matched control. 2. Normal coronary origin with right dominance. 3. Severe CAD in the obtuse marginal branch of the left circumflex artery, CADRADS = 4. Mild CAD in  the Mid LAD and proximal RCA. Electronically Signed   By: Cherlynn Kaiser   On: 01/12/2018 18:03   Result Date: 01/12/2018 EXAM: OVER-READ INTERPRETATION  CT CHEST The following report is an over-read performed by radiologist Dr. Rolm Baptise of [facility] on 01/12/2018. This over-read does not include interpretation of cardiac or coronary anatomy or pathology. The coronary CTA interpretation by the cardiologist is attached. COMPARISON:  None. FINDINGS: Vascular: Early aneurysmal dilatation of the ascending thoracic aorta, 4.1 cm maximally. Heart is mildly enlarged. Mediastinum/Nodes: No adenopathy in the lower mediastinum or hila. Lungs/Pleura: Minimal dependent atelectasis in the lower lungs. No effusions. Upper Abdomen: Imaging into the upper abdomen shows no acute findings. Musculoskeletal: Chest wall soft tissues are unremarkable. No acute bony abnormality. IMPRESSION: Early dilatation of the ascending thoracic aorta, 4.1 cm maximally. Recommend annual imaging followup by CTA or MRA. This recommendation follows 2010 ACCF/AHA/AATS/ACR/ASA/SCA/SCAI/SIR/STS/SVM Guidelines for the Diagnosis and Management of Patients with Thoracic Aortic Disease. Circulation. 2010; 121: T517-O160 Electronically Signed: By: Rolm Baptise M.D. On: 01/12/2018 13:32   Ct Coronary Fractional Flow Reserve Data Prep  Result Date: 01/18/2018 EXAM: CT FFR ANALYSIS CLINICAL DATA:  DOE , AFIB FINDINGS: FFRct analysis was performed on the original cardiac CT angiogram dataset. Diagrammatic representation of the FFRct analysis is provided  in a separate PDF document in PACS. This dictation was created using the PDF document and an interactive 3D model of the results. 3D model is not available in the EMR/PACS. Normal FFR range is >0.80. 1. Left Main:  No significant stenosis. FFR =0.97 2. LAD: No significant stenosis. Distal FFR = 0.86 3. LCX: There is a severe stenosis in the large caliber OM branch, FFR = 0.70. Circumflex  has no significant stenosis Distal FFR = 0.81 4. RCA: No significant stenosis. Distal FFR = 0.91 IMPRESSION: 1. CT FFR analysis showed severe stenosis in the proximal portion of the large OM branch, FFR 0.70 Electronically Signed   By: Cherlynn Kaiser   On: 01/18/2018 12:57   Disposition   Patient is being discharged home today in good condition.  Follow-up Plans & Appointments    Follow-up Information    Leonie Man, MD Follow up.   Specialty:  Cardiology Why:  You have a follow-up visit scheduled with Dr. Ellyn Yoder on 03/03/2018 at 1:20pm. Contact information: [place] [address] [facility].   Specialty:  Cardiology Why:  You have a Nurses Visit on 02/07/2018 at 10:00am to recheck kidney function.  Contact information: [address] [place] [phone]         Discharge Instructions    Diet - low sodium heart healthy   Complete by:  As directed    Increase activity slowly   Complete by:  As directed       Discharge Medications   Allergies as of 02/01/2018   No Known Allergies     Medication List    TAKE these medications   allopurinol 300 MG tablet Commonly known as:  ZYLOPRIM Take 150 mg by mouth daily.   aspirin EC 81 MG tablet Take 1 tablet (81 mg total) by mouth daily.   atorvastatin 40 MG tablet Commonly known as:  LIPITOR Take 1 tablet (40 mg total) by mouth daily.   losartan 25 MG tablet Commonly known as:  COZAAR Take 1 tablet (25 mg total) by mouth daily.   metoprolol succinate 50 MG 24 hr tablet Commonly known as:  TOPROL-XL Take 1 tablet (50 mg total) by mouth 2 (two) times daily.   multivitamin with minerals Tabs tablet Take 1 tablet by mouth daily.   rivaroxaban 20 MG Tabs tablet Commonly known as:  XARELTO Take 1 tablet (20 mg total) by mouth daily with supper.   sertraline 100 MG tablet Commonly known as:   ZOLOFT Take 50 mg by mouth daily.   tadalafil 5 MG tablet Commonly known as:  CIALIS Take 5 mg by mouth daily as needed for erectile dysfunction.         Outstanding Labs/Studies   Will recheck kidney function in 1 week.  Will need repeat lipid panel and CMP in about 6 weeks after starting statin.   Duration of Discharge Encounter   Greater than 30 minutes including physician time.  Signed, Darreld Mclean, PA-C 02/01/2018, 11:04 AM

## 2018-02-07 ENCOUNTER — Other Ambulatory Visit: Payer: Self-pay

## 2018-02-07 ENCOUNTER — Ambulatory Visit (INDEPENDENT_AMBULATORY_CARE_PROVIDER_SITE_OTHER): Payer: Medicare Other

## 2018-02-07 VITALS — BP 130/76 | HR 52

## 2018-02-07 DIAGNOSIS — I420 Dilated cardiomyopathy: Secondary | ICD-10-CM

## 2018-02-07 DIAGNOSIS — I4891 Unspecified atrial fibrillation: Secondary | ICD-10-CM

## 2018-02-07 DIAGNOSIS — N183 Chronic kidney disease, stage 3 unspecified: Secondary | ICD-10-CM

## 2018-02-07 DIAGNOSIS — I10 Essential (primary) hypertension: Secondary | ICD-10-CM

## 2018-02-07 LAB — BASIC METABOLIC PANEL
BUN/Creatinine Ratio: 19 (ref 10–24)
BUN: 28 mg/dL — ABNORMAL HIGH (ref 8–27)
CO2: 23 mmol/L (ref 20–29)
Calcium: 9.9 mg/dL (ref 8.6–10.2)
Chloride: 100 mmol/L (ref 96–106)
Creatinine, Ser: 1.48 mg/dL — ABNORMAL HIGH (ref 0.76–1.27)
GFR calc Af Amer: 55 mL/min/{1.73_m2} — ABNORMAL LOW (ref >59)
GFR calc non Af Amer: 47 mL/min/{1.73_m2} — ABNORMAL LOW (ref >59)
Glucose: 102 mg/dL — ABNORMAL HIGH (ref 65–99)
Potassium: 4.9 mmol/L (ref 3.5–5.2)
Sodium: 138 mmol/L (ref 134–144)

## 2018-02-07 NOTE — Progress Notes (Signed)
1.) Reason for visit: B/P check  2.) Name of MD requesting visit: Dr.Harding  3.) H&P: Recent discharged from [facility]  4.) ROS related to problem: No complaints  5.) Assessment and plan per MD: Continue same medications.Bmet today.Keep appointment with Dr.Harding 03/03/18 at 1:20 pm.

## 2018-02-23 HISTORY — PX: TRANSTHORACIC ECHOCARDIOGRAM: SHX275

## 2018-03-03 ENCOUNTER — Ambulatory Visit (INDEPENDENT_AMBULATORY_CARE_PROVIDER_SITE_OTHER): Payer: Medicare Other | Admitting: Cardiology

## 2018-03-03 ENCOUNTER — Encounter: Payer: Self-pay | Admitting: Cardiology

## 2018-03-03 VITALS — BP 147/87 | HR 54 | Ht 70.5 in | Wt 230.0 lb

## 2018-03-03 DIAGNOSIS — I420 Dilated cardiomyopathy: Secondary | ICD-10-CM | POA: Diagnosis not present

## 2018-03-03 DIAGNOSIS — I480 Paroxysmal atrial fibrillation: Secondary | ICD-10-CM | POA: Diagnosis not present

## 2018-03-03 DIAGNOSIS — I2510 Atherosclerotic heart disease of native coronary artery without angina pectoris: Secondary | ICD-10-CM | POA: Insufficient documentation

## 2018-03-03 DIAGNOSIS — I10 Essential (primary) hypertension: Secondary | ICD-10-CM | POA: Diagnosis not present

## 2018-03-03 MED ORDER — FUROSEMIDE 20 MG PO TABS: ORAL_TABLET | ORAL | 6 refills | Status: DC

## 2018-03-03 MED ORDER — LOSARTAN POTASSIUM 50 MG PO TABS: 50.0000 mg | ORAL_TABLET | Freq: Every day | ORAL | 3 refills | Status: DC

## 2018-03-03 NOTE — Progress Notes (Signed)
PCP: Lajean Manes, MD  Clinic Note: Chief Complaint  Patient presents with  . Hospitalization Follow-up    Post-cath  . Coronary Artery Disease    Nonobstructive.  No angina  . Atrial Fibrillation    No recurrence post cardioversion    HPI: Bruce Yoder is a 73 y.o. male with a PMH of recently diagnosed atrial fibrillation and cardiomyopathy with EF of 35-40% and diffuse hypokinesis along with abnormal coronary CTA who presents today for 1-40-month and post-cath follow-up.  Was seen initially on October 26, 2017 for new onset A. fib.  On monitor was found to be in persistent A. Fib --> had cardioversion on October 22, however echocardiogram showed EF of 35 to 40% with severe LVH and global hypokinesis.  Coronary CT angiogram suggested possible concerning OM lesion.  Bruce Yoder was last seen on November 27 for follow-up of his cardiomyopathy and to discuss results of his coronary CTA and echocardiogram.  He was referred for cardiac catheterization on December 9.  Recent Hospitalizations:   Cardiac Cath 01/31/2018  Studies Personally Reviewed - (if available, images/films reviewed: From Epic Chart or Care Everywhere)  Cath 01/31/2018:   Moderate single-vessel disease with FFR negative roughly 55 to 60% lesion in the proximal OM1.  Otherwise minimal disease.  Moderately elevated LVEDP.  Interval History: Bruce Yoder returns today overall doing fairly well.  He still mostly gets a little bit of dyspnea if he goes uphill playing golf, but not with routine activity.  This is better than it was about 2 years ago.  And certainly much better than it was since he was in A. fib.  Since having converted from A. fib, he really has noted that his exertional dyspnea has improved.  He is not having any resting dyspnea and no chest pain or pressure with rest or exertion.  Certainly nothing to suggest that the OM lesion is significant and causing anginal pain. No heart failure symptoms of PND,  orthopnea or edema.  No sensation of recurrent A. fib.  No rapid irregular heartbeats palpitations.  No syncope/near syncope or TIA/amaurosis fugax. No bleeding on Xarelto -- No melena, hematochezia, hematuria, or epstaxis. No claudication.  ROS: A comprehensive was performed. Review of Systems  Constitutional: Negative for malaise/fatigue and weight loss.  HENT: Negative for congestion.   Respiratory: Negative for cough and shortness of breath.   Gastrointestinal: Negative for abdominal pain and heartburn.  Musculoskeletal: Negative for myalgias.  Neurological: Negative for dizziness, focal weakness, weakness and headaches.  Endo/Heme/Allergies: Does not bruise/bleed easily.    I have reviewed and (if needed) personally updated the patient's problem list, medications, allergies, past medical and surgical history, social and family history.   Past Medical History:  Diagnosis Date  . Atrial fibrillation ([place])    a. diagnosed on 10/22/2017; b. successful DCCV on 12/14/2017  . Basal cell carcinoma (BCC) of back    Dr. Elvera Lennox  . CAD (coronary artery disease)    a. LHC 01/31/2018: OM2 55-60%, FFR 0.93. pLAD 25%  . CKD (chronic kidney disease) stage 3, GFR 30-59 ml/min (HCC)    Unilateral kidney  . Dilated cardiomyopathy ([place])    a. echo 11/03/2017 showed LVEF of 35-40% with severe LVH   . Diverticulosis of colon    With polyps (Dr. Amedeo Plenty March 2017, followed by Dr. Glennon Hamilton)  . Erectile dysfunction   . Essential hypertension 2018  . Gout   . OSA on CPAP    [facility]  . Status  post nephrectomy 1965  . Thrombocytopenia, unspecified (HCC)    Platelet clumping (pseudothrombocytopenia)    Past Surgical History:  Procedure Laterality Date  . APPENDECTOMY     During childhood  . CARDIOVERSION N/A 12/14/2017   Procedure: CARDIOVERSION;  Surgeon: Sueanne Margarita, MD;  Location: [facility] ENDOSCOPY;  Service: Cardiovascular;  Laterality: N/A;  . CYST EXCISION      face  . INTRAVASCULAR PRESSURE WIRE/FFR STUDY N/A 01/31/2018   Procedure: INTRAVASCULAR PRESSURE WIRE/FFR STUDY;  Surgeon: Leonie Man, MD;  Location: [place] [facility] CV LAB;  Service: Cardiovascular;  Laterality: N/A;  . LEFT HEART CATH AND CORONARY ANGIOGRAPHY N/A 01/31/2018   Procedure: LEFT HEART CATH AND CORONARY ANGIOGRAPHY;  Surgeon: Leonie Man, MD;  Location: [place] CV LAB;  Service: Cardiovascular: Single-vessel disease with roughly 60% lesion in major OM branch.  FFR negative.  Marland Kitchen NEPHRECTOMY Right 1965  . TRANSTHORACIC ECHOCARDIOGRAM  10/2017    EF 35 -40% with severe LVH.  Diffuse global hypokinesis.  Mild aortic and mitral regurgitation.  Mild left and right atrial dilation.  Marland Kitchen VASECTOMY  1989    Current Meds  Medication Sig  . allopurinol (ZYLOPRIM) 300 MG tablet Take 150 mg by mouth daily.   Marland Kitchen aspirin EC 81 MG tablet Take 1 tablet (81 mg total) by mouth daily.  Marland Kitchen atorvastatin (LIPITOR) 40 MG tablet Take 1 tablet (40 mg total) by mouth daily.  . metoprolol succinate (TOPROL-XL) 50 MG 24 hr tablet Take 1 tablet (50 mg total) by mouth 2 (two) times daily.  . Multiple Vitamin (MULTIVITAMIN WITH MINERALS) TABS tablet Take 1 tablet by mouth daily.  . rivaroxaban (XARELTO) 20 MG TABS tablet Take 1 tablet (20 mg total) by mouth daily with supper.  . sertraline (ZOLOFT) 100 MG tablet Take 50 mg by mouth daily.   . tadalafil (CIALIS) 5 MG tablet Take 5 mg by mouth daily as needed for erectile dysfunction.   . [DISCONTINUED] losartan (COZAAR) 25 MG tablet Take 1 tablet (25 mg total) by mouth daily.    No Known Allergies  Social History   Tobacco Use  . Smoking status: Never Smoker  . Smokeless tobacco: Never Used  Substance Use Topics  . Alcohol use: Yes    Comment: moderate   . Drug use: Never   Social History   Social History Narrative   He is married now for 4 years (remarried).  He has 1 child (presumably from previous marriage -71 years old).   He lives with  his wife.  He is an avid Chief Executive Officer.   He is a retired Producer, television/film/video for [employer].  (He has a BA in American studies at [employer].)   Never smoked.  Drinks 5-7 alcoholic beverages a week usually beer or hard cider.  May be occasionally a mixed drink.   He is quite active exercise at least 4 days a week for 30 minutes at a time.       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  He enjoys walking on both treadmill and in the community.  He plays golf routinely.    family history is not on file.  Wt Readings from Last 3 Encounters:  03/03/18 230 lb (104.3 kg)  02/01/18 229 lb 4.5 oz (104 kg)  01/19/18 233 lb 9.6 oz (106 kg)    PHYSICAL EXAM BP (!) 147/87   Pulse (!) 54  Ht 5' 10.5" (1.791 m)   Wt 230 lb (104.3 kg)   BMI 32.54 kg/m  Physical Exam  Constitutional: He is oriented to person, place, and time. He appears well-developed and well-nourished. No distress.  Healthy-appearing.  Well-groomed  HENT:  Head: Normocephalic and atraumatic.  Neck: Normal range of motion. Neck supple. No hepatojugular reflux and no JVD present. Carotid bruit is not present.  Cardiovascular: Normal rate, regular rhythm, normal heart sounds, intact distal pulses and normal pulses.  No extrasystoles are present. PMI is not displaced. Exam reveals no gallop and no friction rub.  No murmur heard. Pulmonary/Chest: Effort normal and breath sounds normal. No respiratory distress. He has no wheezes. He has no rales.  Abdominal: Soft. Bowel sounds are normal. He exhibits no distension. There is no abdominal tenderness.  Musculoskeletal: Normal range of motion.        General: No edema.     Comments: Radial cath site C/C/I.  Positive Allen's test.  Neurological: He is alert and oriented to person, place, and time.  Psychiatric: His behavior is normal. Judgment and thought content normal.  Vitals  reviewed.    Adult ECG Report -Not checked  Other studies Reviewed: Additional studies/ records that were reviewed today include:  Recent Labs:   Lab Results  Component Value Date   CHOL 142 02/01/2018   HDL 34 (L) 02/01/2018   LDLCALC 87 02/01/2018   TRIG 105 02/01/2018   CHOLHDL 4.2 02/01/2018     ASSESSMENT / PLAN: Problem List Items Addressed This Visit    Coronary artery disease, non-occlusive (Chronic)    Moderate, nonobstructive disease in the circumflex.  FFR negative.  Will need aggressive risk modification with lipid management and blood pressure control. Continue on atorvastatin at this point along with aspirin and beta-blocker.  As long as no bleeding, it were okay with aspirin plus Xarelto, but if there is bleeding or bruising with stop aspirin.      Relevant Medications   losartan (COZAAR) 50 MG tablet   furosemide (LASIX) 20 MG tablet   Dilated cardiomyopathy (HCC) (Chronic)    Suspect this is related to tachycardia.  He should be due for follow-up echocardiogram in about a month. No signs of heart failure.  I suspect that EF is better. Remain on current dose of Toprol but will increase losartan to 50 mg. We will prescribe low-dose Lasix 20 mg daily as needed for swelling or worsening dyspnea.      Relevant Medications   losartan (COZAAR) 50 MG tablet   furosemide (LASIX) 20 MG tablet   Other Relevant Orders   ECHOCARDIOGRAM COMPLETE   Essential hypertension (Chronic)    Blood pressure is low but high today.  With reduced EF, will need to have little better control.  Will increase ARB to 50 mg daily and continue current dose of Toprol.      Relevant Medications   losartan (COZAAR) 50 MG tablet   furosemide (LASIX) 20 MG tablet   Paroxysmal atrial fibrillation ([place]): CHA2DS2-VASc Score 4 (Age, CAD, HTN, CHF) - Primary (Chronic)    Certainly symptomatic from a heart failure standpoint and likely tachycardia related cardiomyopathy.  Remaining in sinus  rhythm now after cardioversion.  If he has recurrence, would probably need to consider antiarrhythmic, but for now since he is not having any recurrence, we will simply continue rate control with beta-blocker. Continue Xarelto for anticoagulation.      Relevant Medications   losartan (COZAAR) 50 MG tablet   furosemide (  LASIX) 20 MG tablet   Other Relevant Orders   ECHOCARDIOGRAM COMPLETE     I spent a total of 17minutes with the patient and chart review. >  50% of the time was spent in direct patient consultation.   Current medicines are reviewed at length with the patient today.  (+/- concerns) n/a The following changes have been made:  see below  Patient Instructions  Medication Instructions:   --- STOP LOSARTAN 25 MG  ---- START TAKING 50 MG ONE TABLET DAILY   -- MAY TAKE 20 MG LASIX ( FUROSEMIDE) ONE TABLET AS NEEDED FOR SWELLING DAILY.   If you need a refill on your cardiac medications before your next appointment, please call your pharmacy.   Lab work:  not needed   If you have labs (blood work) drawn today and your tests are completely normal, you will receive your results only by: Marland Kitchen MyChart Message (if you have MyChart) OR . A paper copy in the mail If you have any lab test that is abnormal or we need to change your treatment, we will call you to review the results.  Testing/Procedures:  Schedule at [facility] FEB 2020 Your physician has requested that you have an echocardiogram. Echocardiography is a painless test that uses sound waves to create images of your heart. It provides your doctor with information about the size and shape of your heart and how well your heart's chambers and valves are working. This procedure takes approximately one hour. There are no restrictions for this procedure.    Follow-Up: At [facility], you and your health needs are our priority.  As part of our continuing mission to provide  you with exceptional heart care, we have created designated Provider Care Teams.  These Care Teams include your primary Cardiologist (physician) and Advanced Practice Providers (APPs -  Physician Assistants and Nurse Practitioners) who all work together to provide you with the care you need, when you need it. . Your physician recommends that you schedule a follow-up appointment in March 2020 with Dr Ellyn Hack .   Any Other Special Instructions Will Be Listed Below (If Applicable).    Studies Ordered:   Orders Placed This Encounter  Procedures  . ECHOCARDIOGRAM COMPLETE      Glenetta Hew, M.D., M.S. Interventional Cardiologist   Pager # [phone] Phone # [phone] [address] [place], [place] [address]   Thank you for choosing Heartcare at [facility]!!

## 2018-03-03 NOTE — Patient Instructions (Addendum)
Medication Instructions:   --- STOP LOSARTAN 25 MG  ---- START TAKING 50 MG ONE TABLET DAILY   -- MAY TAKE 20 MG LASIX ( FUROSEMIDE) ONE TABLET AS NEEDED FOR SWELLING DAILY.   If you need a refill on your cardiac medications before your next appointment, please call your pharmacy.   Lab work:  not needed   If you have labs (blood work) drawn today and your tests are completely normal, you will receive your results only by: Marland Kitchen MyChart Message (if you have MyChart) OR . A paper copy in the mail If you have any lab test that is abnormal or we need to change your treatment, we will call you to review the results.  Testing/Procedures:  Schedule at [facility] FEB 2020 Your physician has requested that you have an echocardiogram. Echocardiography is a painless test that uses sound waves to create images of your heart. It provides your doctor with information about the size and shape of your heart and how well your heart's chambers and valves are working. This procedure takes approximately one hour. There are no restrictions for this procedure.    Follow-Up: At [facility], you and your health needs are our priority.  As part of our continuing mission to provide you with exceptional heart care, we have created designated Provider Care Teams.  These Care Teams include your primary Cardiologist (physician) and Advanced Practice Providers (APPs -  Physician Assistants and Nurse Practitioners) who all work together to provide you with the care you need, when you need it. . Your physician recommends that you schedule a follow-up appointment in March 2020 with Dr Ellyn Hack .   Any Other Special Instructions Will Be Listed Below (If Applicable).

## 2018-03-06 ENCOUNTER — Encounter: Payer: Self-pay | Admitting: Cardiology

## 2018-03-06 NOTE — Assessment & Plan Note (Signed)
Certainly symptomatic from a heart failure standpoint and likely tachycardia related cardiomyopathy.  Remaining in sinus rhythm now after cardioversion.  If he has recurrence, would probably need to consider antiarrhythmic, but for now since he is not having any recurrence, we will simply continue rate control with beta-blocker. Continue Xarelto for anticoagulation.

## 2018-03-06 NOTE — Assessment & Plan Note (Signed)
Moderate, nonobstructive disease in the circumflex.  FFR negative.  Will need aggressive risk modification with lipid management and blood pressure control. Continue on atorvastatin at this point along with aspirin and beta-blocker.  As long as no bleeding, it were okay with aspirin plus Xarelto, but if there is bleeding or bruising with stop aspirin.

## 2018-03-06 NOTE — Assessment & Plan Note (Signed)
Blood pressure is low but high today.  With reduced EF, will need to have little better control.  Will increase ARB to 50 mg daily and continue current dose of Toprol.

## 2018-03-06 NOTE — Assessment & Plan Note (Addendum)
Suspect this is related to tachycardia.  He should be due for follow-up echocardiogram in about a month. No signs of heart failure.  I suspect that EF is better. Remain on current dose of Toprol but will increase losartan to 50 mg. We will prescribe low-dose Lasix 20 mg daily as needed for swelling or worsening dyspnea.

## 2018-03-23 ENCOUNTER — Ambulatory Visit (HOSPITAL_COMMUNITY): Payer: Medicare Other | Attending: Cardiology

## 2018-03-23 DIAGNOSIS — I420 Dilated cardiomyopathy: Secondary | ICD-10-CM

## 2018-03-23 DIAGNOSIS — I480 Paroxysmal atrial fibrillation: Secondary | ICD-10-CM | POA: Insufficient documentation

## 2018-04-14 DIAGNOSIS — F4323 Adjustment disorder with mixed anxiety and depressed mood: Secondary | ICD-10-CM | POA: Diagnosis not present

## 2018-04-23 ENCOUNTER — Other Ambulatory Visit (HOSPITAL_COMMUNITY): Payer: Self-pay | Admitting: Student

## 2018-05-05 DIAGNOSIS — F4323 Adjustment disorder with mixed anxiety and depressed mood: Secondary | ICD-10-CM | POA: Diagnosis not present

## 2018-05-12 ENCOUNTER — Telehealth: Payer: Self-pay | Admitting: *Deleted

## 2018-05-12 NOTE — Telephone Encounter (Signed)
LEFT MESSAGE TO CALL BACK - IN REGARDS TO POSTPONE MARCH 24,2020 W/DR HARDING.

## 2018-05-16 ENCOUNTER — Telehealth: Payer: Self-pay | Admitting: Cardiology

## 2018-05-16 NOTE — Telephone Encounter (Signed)
Per pt doing okay from cardiac standpoint Pt has what thinks is cold and has noted fever a few days ago Encouraged pt if has concerns to call 211 re COVID  19 and may also contact PMD .Will forward to Dr Ellyn Hack for review./cy

## 2018-05-16 NOTE — Telephone Encounter (Signed)
New message:    Patient calling concerning his appt coming up. Patient has been travling and has been out of state. Patient also states that he think he is having some of the symptoms of the virus. Please call patient.

## 2018-05-17 NOTE — Telephone Encounter (Signed)
   Primary Cardiologist:  Glenetta Hew, MD   Patient contacted.  History reviewed.  No symptoms to suggest any unstable cardiac conditions.  Based on discussion, with current pandemic situation, we will be postponing this appointment for [facility] with a plan for f/u in  6-12 wks or sooner if feasible/necessary.  If symptoms change, he has been instructed to contact our office.   Routing to C19 CANCEL pool for tracking .   Raiford Simmonds, RN  05/17/2018 10:05 AM         .

## 2018-05-17 NOTE — Telephone Encounter (Signed)
Spoke to patient . Aware will postpone  For next 6 - 12 weeks . Patient aware to contact office  If cardiac symptoms occur for evalution

## 2018-05-18 ENCOUNTER — Ambulatory Visit: Payer: Medicare Other | Admitting: Cardiology

## 2018-05-23 ENCOUNTER — Other Ambulatory Visit: Payer: Self-pay | Admitting: Cardiology

## 2018-05-23 ENCOUNTER — Other Ambulatory Visit: Payer: Self-pay

## 2018-05-23 MED ORDER — RIVAROXABAN 20 MG PO TABS: ORAL_TABLET | ORAL | 3 refills | Status: DC

## 2018-05-23 NOTE — Telephone Encounter (Signed)
Age 73, weight 104kg, SCr 1.48 on 02/07/18, CrCl 37mL/min. Last OV January 2020.

## 2018-06-08 ENCOUNTER — Telehealth: Payer: Self-pay | Admitting: Adult Health

## 2018-06-08 NOTE — Telephone Encounter (Signed)
BP YES  scale  YES  MyChart NO  email YES  VERBAL CONSENT OBTAINED    In the setting of the current Covid19 crisis, you are scheduled for a [facility] visit with your provider on 06-09-2018 at 1130AM.  Just as we do with many in-office visits, in order for you to participate in this visit, we must obtain consent.  If you'd like, I can send this to your mychart (if signed up) or email for you to review.  Otherwise, I can obtain your verbal consent now.  All virtual visits are billed to your insurance company just like a normal visit would be.  By agreeing to a virtual visit, we'd like you to understand that the technology does not allow for your provider to perform an examination, and thus may limit your provider's ability to fully assess your condition.  Finally, though the technology is pretty good, we cannot assure that it will always work on either your or our end, and in the setting of a video visit, we may have to convert it to a phone-only visit.  In either situation, we cannot ensure that we have a secure connection.  Are you willing to proceed?  Verbalized consent.'

## 2018-06-08 NOTE — Telephone Encounter (Signed)
Mychart sent via email, smartphone, pre reg complete 06/08/18 AF

## 2018-06-08 NOTE — Progress Notes (Signed)
Virtual Visit via Video Note   This visit type was conducted due to national recommendations for restrictions regarding the COVID-19 Pandemic (e.g. social distancing) in an effort to limit this patient's exposure and mitigate transmission in our community.  Due to his co-morbid illnesses, this patient is at least at moderate risk for complications without adequate follow up.  This format is felt to be most appropriate for this patient at this time.  All issues noted in this document were discussed and addressed.  A limited physical exam was performed with this format.  Please refer to the patient's chart for his consent to telehealth for [facility].   Evaluation Performed:  Follow-up visit  This visit type was conducted due to national recommendations for restrictions regarding the COVID-19 Pandemic (e.g. social distancing).  This format is felt to be most appropriate for this patient at this time.  All issues noted in this document were discussed and addressed.  No physical exam was performed (except for noted visual exam findings with Telehealth visits).  See MyChart message from today for the patient's consent to telehealth for [facility].  Patient has given verbal permission to conduct this visit via virtual appointment and to bill insurance 06/09/2018 11:35 am.      Date:  06/09/2018   ID:  Bruce Yoder, DOB [birth year], MRN [id number]  Patient Location:  [address] [place] [address]   Provider location:   [place], [place]-Home office   PCP:  Lajean Manes, MD  Cardiologist:  Glenetta Hew, MD  Electrophysiologist:  None   Chief Complaint:  CAD follow up.   History of Present Illness:    Bruce Yoder is a 73 y.o. male who presents via audio/video conferencing for a telehealth visit today, we are following for ongoing assessment and management of paroxysmal atrial fibrillation, cardiomyopathy with EF of 35% to 40% and diffuse hypokinesis along with abnormal coronary  CTA, with cardiac catheterization 01/31/2018 demonstrating moderate single-vessel disease with FFR negative roughly 55% to 60% lesion in the proximal OM1.  Otherwise minimal disease, moderately elevated LVEDP.  He was last seen by Dr. Ellyn Hack on 03/03/2018, was doing well playing golf remaining active.  He was not having any exertional symptoms of chest pain or dyspnea.  He denied rapid heart rate or irregular heart rate.  The patient was continued on aspirin, ARB, and Xarelto.  If bleeding or bruising occurred we would stop aspirin.  ARB was increased to 50 mg daily, and Lasix was prescribed to take 20 mg daily as needed for edema or worsening dyspnea. Follow up echocardiogram was ordered.   On review of echocardiogram by Dr. Ellyn Hack, he commented that bradykinesia was noted, ejection fraction back to normal with no wall motion abnormalities exhalation point this confirmed that his low pump function was related to atrial fibrillation.  The plan was to continue to maintain normal sinus rhythm, and convert to normal sinus rhythm as soon as he goes into atrial fibrillation.  If he did have recurrence of atrial fibrillation medication changes would be made.  He states that he is feeling well. Playing golf almost everyday. He walks instead of driving golf cart. Having some seasonal allergies. He has an elliptical and treadmill in his basement that he uses when he cannot get outside to exercise. He has had a cold with coughing related to sinus allergies. He does not have COVID symptoms. He is medically complaint. He does not have CP, DOE, or issues with bleeding.  The patient does not have symptoms concerning for COVID-19 infection (fever, chills, cough, or new SHORTNESS OF BREATH).    Prior CV studies:   The following studies were reviewed today: Echocardiogram 03/23/2018 1. The left ventricle appears to be normal in size, have mild wall thickness, with normal systolic function of 35-32%. Echo evidence of  normal in diastolic filling patterns.  2. Right ventricular systolic pressure is is mildly elevated.  3. The right ventricle is normal in size, has normal wall thickness and normal systolic function.  4. Moderately dilated left atrial size.  5. Normal right atrial size.  6. Mitral valve regurgitation is mild by color flow Doppler.  7. The mitral valve normal in structure and function.  8. Normal tricuspid valve.  9. Tricuspid regurgitation mild-moderate. 10. Aortic valve normal. 11. Aortic valve regurgitation is mild by color flow Doppler. 12. The aortic root and ascending aortaare normal is size and structure. 13. The inferior vena cava was dilated in size with >50% respiratory variablity. 14. No atrial level shunt detected by color flow Doppler.  Cardiac Catheterization 01/31/2018  Ost 2nd Mrg lesion is 55-60% stenosed. FFR 0.93 (not physiologically significant)  Prox LAD lesion is 25% stenosed.  LV end diastolic pressure is moderately elevated.   SUMMARY  Moderate single-vessel disease with FFR negative roughly 55 to 60% lesion in the proximal OM1.  Otherwise minimal disease.  Moderately elevated LVEDP.   Past Medical History:  Diagnosis Date  . Atrial fibrillation ([place])    a. diagnosed on 10/22/2017; b. successful DCCV on 12/14/2017  . Basal cell carcinoma (BCC) of back    Dr. Elvera Lennox  . CAD (coronary artery disease)    a. LHC 01/31/2018: OM2 55-60%, FFR 0.93. pLAD 25%  . CKD (chronic kidney disease) stage 3, GFR 30-59 ml/min (HCC)    Unilateral kidney  . Dilated cardiomyopathy ([place])    a. echo 11/03/2017 showed LVEF of 35-40% with severe LVH   . Diverticulosis of colon    With polyps (Dr. Amedeo Plenty March 2017, followed by Dr. Glennon Hamilton)  . Erectile dysfunction   . Essential hypertension 2018  . Gout   . OSA on CPAP    [facility]  . Status post nephrectomy 1965  . Thrombocytopenia, unspecified (HCC)    Platelet clumping  (pseudothrombocytopenia)   Past Surgical History:  Procedure Laterality Date  . APPENDECTOMY     During childhood  . CARDIOVERSION N/A 12/14/2017   Procedure: CARDIOVERSION;  Surgeon: Sueanne Margarita, MD;  Location[facility] ENDOSCOPY;  Service: Cardiovascular;  Laterality: N/A;  . CYST EXCISION     face  . INTRAVASCULAR PRESSURE WIRE/FFR STUDY N/A 01/31/2018   Procedure: INTRAVASCULAR PRESSURE WIRE/FFR STUDY;  Surgeon: Leonie Man, MD;  Location: [facility] CV LAB;  Service: Cardiovascular;  Laterality: N/A;  . LEFT HEART CATH AND CORONARY ANGIOGRAPHY N/A 01/31/2018   Procedure: LEFT HEART CATH AND CORONARY ANGIOGRAPHY;  Surgeon: Leonie Man, MD;  Location: [place] CV LAB;  Service: Cardiovascular: Single-vessel disease with roughly 60% lesion in major OM branch.  FFR negative.  Marland Kitchen NEPHRECTOMY Right 1965  . TRANSTHORACIC ECHOCARDIOGRAM  10/2017    EF 35 -40% with severe LVH.  Diffuse global hypokinesis.  Mild aortic and mitral regurgitation.  Mild left and right atrial dilation.  Marland Kitchen VASECTOMY  1989     Current Meds  Medication Sig  . allopurinol (ZYLOPRIM) 300 MG tablet Take 150 mg by mouth daily.   Marland Kitchen aspirin EC 81 MG tablet  Take 1 tablet (81 mg total) by mouth daily.  Marland Kitchen atorvastatin (LIPITOR) 40 MG tablet Take 1 tablet by mouth once daily  . loratadine (CLARITIN) 10 MG tablet Take 10 mg by mouth daily.  . metoprolol succinate (TOPROL-XL) 50 MG 24 hr tablet Take 1 tablet (50 mg total) by mouth 2 (two) times daily.  . Multiple Vitamin (MULTIVITAMIN WITH MINERALS) TABS tablet Take 1 tablet by mouth daily.  . rivaroxaban (XARELTO) 20 MG TABS tablet TAKE 1 TABLET BY MOUTH ONCE DAILY WITH SUPPER  . sertraline (ZOLOFT) 100 MG tablet Take 50 mg by mouth daily.   . tadalafil (CIALIS) 5 MG tablet Take 5 mg by mouth daily as needed for erectile dysfunction.      Allergies:   Patient has no known allergies.   Social History   Tobacco Use  . Smoking status: Never Smoker  . Smokeless  tobacco: Never Used  Substance Use Topics  . Alcohol use: Yes    Comment: moderate   . Drug use: Never     Family Hx: The patient's family history is not on file.  ROS:   Please see the history of present illness.   All other systems reviewed and are negative.   Labs/Other Tests and Data Reviewed:    Recent Labs: 12/06/2017: TSH 3.308 01/19/2018: Hemoglobin 14.9; Platelets 110 02/07/2018: BUN 28; Creatinine, Ser 1.48; Potassium 4.9; Sodium 138   Recent Lipid Panel Lab Results  Component Value Date/Time   CHOL 142 02/01/2018 07:09 AM   TRIG 105 02/01/2018 07:09 AM   HDL 34 (L) 02/01/2018 07:09 AM   CHOLHDL 4.2 02/01/2018 07:09 AM   LDLCALC 87 02/01/2018 07:09 AM    Wt Readings from Last 3 Encounters:  06/09/18 230 lb (104.3 kg)  03/03/18 230 lb (104.3 kg)  02/01/18 229 lb 4.5 oz (104 kg)     Exam:    Vital Signs:  BP (!) 146/80 (BP Location: Left Arm)   Pulse (!) 57   Ht 5' 10.5" (1.791 m)   Wt 230 lb (104.3 kg)   BMI 32.54 kg/m    Well nourished, well developed male in no acute distress. He is AAO No dyspnea when speaking to me. He understands and responds appropriately to questions and instructions.   ASSESSMENT & PLAN:    1. Atrial Fibrillation: He denies any rapid or irregular HR. He continues to take Xarelto as directed. No changes in his regimen at this time  2. CAD: Most recent cardiac cath 01/2018 with moderate disease. He denies symptoms. Remains active.   3. Hypercholesterolemia:  Will need follow up lipids and LFT's prior to next office visit. Continue statin therapy.   4. Hypertension:  Slightly elevated today. He states the readings are actually lower in the physician office. He is instructed to bring his BP machine with him when he comes for his next visit so that we can calibrate it.    COVID-19 Education: The signs and symptoms of COVID-19 were discussed with the patient and how to seek care for testing (follow up with PCP or arrange  E-visit).  The importance of social distancing was discussed today.  Patient Risk:   After full review of this patients clinical status, I feel that they are at least moderate risk at this time.  Time:   Today, I have spent 15 minutes with the patient with telehealth technology discussing current health status, symptoms and medications. Providing follow up instructions.    Medication Adjustments/Labs and Tests Ordered: Current  medicines are reviewed at length with the patient today.  Concerns regarding medicines are outlined above.   Tests Ordered: BMET, CBC, and Lipids and LFTs.   Medication Changes: No orders of the defined types were placed in this encounter.   Disposition:  4 months   Signed, Phill Myron. West Pugh, ANP, [facility]  06/09/2018 11:34 AM    [facility] [place], [place], [place]  [address] Phone: [phone]; Fax: [phone]

## 2018-06-09 ENCOUNTER — Telehealth (INDEPENDENT_AMBULATORY_CARE_PROVIDER_SITE_OTHER): Payer: Medicare Other | Admitting: Adult Health

## 2018-06-09 VITALS — BP 146/80 | HR 57 | Ht 70.5 in | Wt 230.0 lb

## 2018-06-09 DIAGNOSIS — Z79899 Other long term (current) drug therapy: Secondary | ICD-10-CM

## 2018-06-09 DIAGNOSIS — I480 Paroxysmal atrial fibrillation: Secondary | ICD-10-CM | POA: Diagnosis not present

## 2018-06-09 DIAGNOSIS — E7800 Pure hypercholesterolemia, unspecified: Secondary | ICD-10-CM

## 2018-06-09 DIAGNOSIS — I2510 Atherosclerotic heart disease of native coronary artery without angina pectoris: Secondary | ICD-10-CM

## 2018-06-09 DIAGNOSIS — I10 Essential (primary) hypertension: Secondary | ICD-10-CM

## 2018-06-09 MED ORDER — ATORVASTATIN CALCIUM 40 MG PO TABS: 40.0000 mg | ORAL_TABLET | Freq: Every day | ORAL | 1 refills | Status: DC

## 2018-06-09 MED ORDER — LOSARTAN POTASSIUM 50 MG PO TABS: 50.0000 mg | ORAL_TABLET | Freq: Every day | ORAL | 1 refills | Status: DC

## 2018-06-09 MED ORDER — RIVAROXABAN 20 MG PO TABS: ORAL_TABLET | ORAL | 3 refills | Status: DC

## 2018-06-09 MED ORDER — METOPROLOL SUCCINATE ER 50 MG PO TB24: 50.0000 mg | ORAL_TABLET | Freq: Two times a day (BID) | ORAL | 1 refills | Status: DC

## 2018-06-09 NOTE — Patient Instructions (Signed)
Follow-Up: You will need a follow up appointment in 4 months.  Please call our office 2 months in advance, June 2020, to schedule, august 2020, this appointment.  You may see Glenetta Hew, MD Jory Sims, DNP, ANP, AACC or one of the following Advanced Practice Providers on your designated Care Team:  Rosaria Ferries, PA-C  Jory Sims, DNP, ANP        Medication Instructions:  NO CHANGES- Your physician recommends that you continue on your current medications as directed. Please refer to the Current Medication list given to you today. If you need a refill on your cardiac medications before your next appointment, please call your pharmacy. Labwork: When you have labs (blood work) and your tests are completely normal, you will receive your results ONLY by [place] (if you have MyChart) -OR- A paper copy in the mail.  At [facility], you and your health needs are our priority.  As part of our continuing mission to provide you with exceptional heart care, we have created designated Provider Care Teams.  These Care Teams include your primary Cardiologist (physician) and Advanced Practice Providers (APPs -  Physician Assistants and Nurse Practitioners) who all work together to provide you with the care you need, when you need it.  Thank you for choosing [facility] at [facility]!!

## 2018-08-19 DIAGNOSIS — Z03818 Encounter for observation for suspected exposure to other biological agents ruled out: Secondary | ICD-10-CM | POA: Diagnosis not present

## 2018-10-24 DIAGNOSIS — Z23 Encounter for immunization: Secondary | ICD-10-CM | POA: Diagnosis not present

## 2018-11-04 ENCOUNTER — Ambulatory Visit (INDEPENDENT_AMBULATORY_CARE_PROVIDER_SITE_OTHER): Payer: Medicare Other | Admitting: Cardiology

## 2018-11-04 ENCOUNTER — Other Ambulatory Visit: Payer: Self-pay

## 2018-11-04 VITALS — BP 148/78 | HR 55 | Temp 97.2°F | Ht 70.0 in | Wt 238.2 lb

## 2018-11-04 DIAGNOSIS — I420 Dilated cardiomyopathy: Secondary | ICD-10-CM | POA: Diagnosis not present

## 2018-11-04 DIAGNOSIS — I10 Essential (primary) hypertension: Secondary | ICD-10-CM | POA: Diagnosis not present

## 2018-11-04 DIAGNOSIS — I2510 Atherosclerotic heart disease of native coronary artery without angina pectoris: Secondary | ICD-10-CM | POA: Diagnosis not present

## 2018-11-04 DIAGNOSIS — I480 Paroxysmal atrial fibrillation: Secondary | ICD-10-CM

## 2018-11-04 MED ORDER — LOSARTAN POTASSIUM 100 MG PO TABS: 100.0000 mg | ORAL_TABLET | Freq: Every day | ORAL | 3 refills | Status: DC

## 2018-11-04 NOTE — Progress Notes (Signed)
PCP: Lajean Manes, MD  Clinic Note: Chief Complaint  Patient presents with  . Follow-up    Doing well  . Atrial Fibrillation    With A. fib associated dilated cardiomyopathy, resolved  . Coronary Artery Disease    Moderate, not obstructive by cath    HPI: Bruce Yoder is a 73 y.o. male with a PMH of PAF & NICM - EF 35-40% (diffuse Hypokinesis) - resolved as of January 2020 Echo who presents for 5 month f/u.   Was very symptomatic with Afib & had CHF Sx.   Was seen initially on October 26, 2017 for new onset A. fib.  On monitor was found to be in persistent A. Fib --> had cardioversion on October 22, however echocardiogram showed EF of 35 to 40% with severe LVH and global hypokinesis.  Coronary CT angiogram suggested possible concerning OM lesion. --> Coronary CT angiogram was read as abnormal, cardiac cath showed roughly 60% circumflex lesion, negative FFR.  Medical management.  I last saw him in January in follow-up from his cardiac cath to discuss results.  He was doing fairly well.  Just a little dyspneic going uphill playing golf.  No chest pain or pressure.  No signs of recurrent A. Fib. --> I increased his losartan to 25 mg because of high blood pressure.  Also allow for use of PRN furosemide.  Bruce Yoder was last seen on June 09, 2018 by Jory Sims, NP for echocardiogram follow-up.  He was doing well.  Playing golf almost every day.  Walking instead of using the golf cart.  Was dealing with a cold with allergies, but otherwise doing well.  Recent Hospitalizations: None  Studies Personally Reviewed - (if available, images/films reviewed: From Epic Chart or Care Everywhere)  Echo Jan 2020: EF 55-60%, normal filling pressure. Mod LA dilation.  Mild to moderate TR.  Interval History: Bruce Yoder returns today feeling great.  Not really having any major symptoms.  Not had any further recurrent symptoms of A. fib.  Is now walking 18 holes of golf most days a week.  Getting  good amount of exercise.  He is also doing elliptical trainer as well as the bicycle trainer on high intensity and not having any sensation of dyspnea or chest pain.  No rapid irregular heartbeats palpitations.  No syncope or near syncope.  No TIA or amaurosis fugax. No heart failure symptoms of PND, orthopnea or edema.  He is trying to lose weight, says he is really picked up with exercise, but he feels like he probably eats more than he should.  He is trying to work on that.  overall doing fairly well.  He still mostly gets a little bit of dyspnea if he goes uphill playing golf, but not with routine activity.  This is better than it was about 2 years ago.  And certainly much better than it was since he was in A. fib.  Since having converted from A. fib, he really has noted that his exertional dyspnea has improved.  He is not having any resting dyspnea and no chest pain or pressure with rest or exertion.  Certainly nothing to suggest that the OM lesion is significant and causing anginal pain. No heart failure symptoms of PND, orthopnea or edema.  No sensation of recurrent A. fib.  No rapid irregular heartbeats palpitations.  No syncope/near syncope or TIA/amaurosis fugax. No bleeding on Xarelto -- No melena, hematochezia, hematuria, or epstaxis. No claudication.  ROS: A comprehensive was performed. Review  of Systems  Constitutional: Negative for malaise/fatigue and weight loss.  HENT: Negative for congestion and nosebleeds.   Respiratory: Negative for cough and shortness of breath.        Only related to allergies  Gastrointestinal: Negative for abdominal pain and heartburn.  Musculoskeletal: Negative for myalgias.  Neurological: Negative for dizziness, focal weakness, weakness and headaches.  Endo/Heme/Allergies: Positive for environmental allergies (Gets little congestion and stuffy nose.  Mild cough). Does not bruise/bleed easily.  All other systems reviewed and are negative.  The patient  does not have symptoms concerning for COVID-19 infection (fever, chills, cough, or new shortness of breath).  The patient is practicing social distancing.   I have reviewed and (if needed) personally updated the patient's problem list, medications, allergies, past medical and surgical history, social and family history.   Past Medical History:  Diagnosis Date  . Atrial fibrillation ([place])    a. diagnosed on 10/22/2017; b. successful DCCV on 12/14/2017  . Basal cell carcinoma (BCC) of back    Dr. Elvera Lennox  . CAD (coronary artery disease)    a. LHC 01/31/2018: OM2 55-60%, FFR 0.93. pLAD 25%  . CKD (chronic kidney disease) stage 3, GFR 30-59 ml/min (HCC)    Unilateral kidney  . Dilated cardiomyopathy ([place]) 10/2017   Thought to be related to A. fib RVR: a) echo 11/03/2017 showed LVEF of 35-40% with severe LVH --> follow-up echo January 2020 showed resolution with EF 55 to 60%.  Normal filling pressures.  Read as mild as opposed to severe LVH  . Diverticulosis of colon    With polyps (Dr. Amedeo Plenty March 2017, followed by Dr. Glennon Hamilton)  . Erectile dysfunction   . Essential hypertension 2018  . Gout   . OSA on CPAP    [facility]  . Status post nephrectomy 1965  . Thrombocytopenia, unspecified (HCC)    Platelet clumping (pseudothrombocytopenia)    Past Surgical History:  Procedure Laterality Date  . APPENDECTOMY     During childhood  . CARDIOVERSION N/A 12/14/2017   Procedure: CARDIOVERSION;  Surgeon: Sueanne Margarita, MD;  Location: [facility] ENDOSCOPY;  Service: Cardiovascular;  Laterality: N/A;  . CYST EXCISION     face  . INTRAVASCULAR PRESSURE WIRE/FFR STUDY N/A 01/31/2018   Procedure: INTRAVASCULAR PRESSURE WIRE/FFR STUDY;  Surgeon: Leonie Man, MD;  Location: [place] CV LAB;  Service: Cardiovascular;  Laterality: N/A;  . LEFT HEART CATH AND CORONARY ANGIOGRAPHY N/A 01/31/2018   Procedure: LEFT HEART CATH AND CORONARY ANGIOGRAPHY;  Surgeon: Leonie Man,  MD;  Location: [place] CV LAB;  Service: Cardiovascular: Single-vessel disease with roughly 60% lesion in major OM branch.  FFR negative.  Marland Kitchen NEPHRECTOMY Right 1965  . TRANSTHORACIC ECHOCARDIOGRAM  10/2017    EF 35 -40% with severe LVH.  Diffuse global hypokinesis.  Mild aortic and mitral regurgitation.  Mild left and right atrial dilation.  . TRANSTHORACIC ECHOCARDIOGRAM  02/2018   EF 55-60%, normal filling pressure. Mod LA dilation.  Mild to moderate TR. -> resolution of cardiomyopathy  . VASECTOMY  1989    Current Meds  Medication Sig  . allopurinol (ZYLOPRIM) 300 MG tablet Take 150 mg by mouth daily.   Marland Kitchen aspirin EC 81 MG tablet Take 1 tablet (81 mg total) by mouth daily.  Marland Kitchen atorvastatin (LIPITOR) 40 MG tablet Take 1 tablet (40 mg total) by mouth daily.  . furosemide (LASIX) 20 MG tablet Take 20 mg tablet  by mouth daily as needed for increase swelling  .  loratadine (CLARITIN) 10 MG tablet Take 10 mg by mouth daily.  . metoprolol succinate (TOPROL-XL) 50 MG 24 hr tablet Take 1 tablet (50 mg total) by mouth 2 (two) times daily.  . Multiple Vitamin (MULTIVITAMIN WITH MINERALS) TABS tablet Take 1 tablet by mouth daily.  . rivaroxaban (XARELTO) 20 MG TABS tablet TAKE 1 TABLET BY MOUTH ONCE DAILY WITH SUPPER  . sertraline (ZOLOFT) 100 MG tablet Take 50 mg by mouth daily.   . tadalafil (CIALIS) 5 MG tablet Take 5 mg by mouth daily as needed for erectile dysfunction.     No Known Allergies  Social History   Tobacco Use  . Smoking status: Never Smoker  . Smokeless tobacco: Never Used  Substance Use Topics  . Alcohol use: Yes    Comment: moderate   . Drug use: Never   Social History   Social History Narrative   He is married now for 4 years (remarried).  He has 1 child (presumably from previous marriage -71 years old).   He lives with his wife.  He is an avid Chief Executive Officer.   He is a retired Producer, television/film/video for [employer].  (He has a BA in American studies at  [employer].)   Never smoked.  Drinks 5-7 alcoholic beverages a week usually beer or hard cider.  May be occasionally a mixed drink.   He is quite active exercise at least 4 days a week for 30 minutes at a time.       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  He enjoys walking on both treadmill and in the community.  He plays golf routinely.    family history is not on file.  Wt Readings from Last 3 Encounters:  11/04/18 238 lb 3.2 oz (108 kg)  06/09/18 230 lb (104.3 kg)  03/03/18 230 lb (104.3 kg)    PHYSICAL EXAM BP (!) 148/78   Pulse (!) 55   Temp (!) 97.2 F (36.2 C)   Ht 5\' 10"  (1.778 m)   Wt 238 lb 3.2 oz (108 kg)   SpO2 96%   BMI 34.18 kg/m  Physical Exam  Constitutional: He is oriented to person, place, and time. He appears well-developed and well-nourished. No distress.  Healthy-appearing.  Well-groomed  HENT:  Head: Normocephalic and atraumatic.  Neck: Normal range of motion. Neck supple. No hepatojugular reflux and no JVD present. Carotid bruit is not present.  Cardiovascular: Normal rate, regular rhythm, normal heart sounds, intact distal pulses and normal pulses.  No extrasystoles are present. PMI is not displaced. Exam reveals no gallop and no friction rub.  No murmur heard. Pulmonary/Chest: Effort normal and breath sounds normal. No respiratory distress. He has no wheezes. He has no rales.  Abdominal: Soft. Bowel sounds are normal. He exhibits no distension. There is no abdominal tenderness.  Musculoskeletal: Normal range of motion.        General: No edema.  Neurological: He is alert and oriented to person, place, and time.  Psychiatric: He has a normal mood and affect. His behavior is normal. Judgment and thought content normal.  Vitals reviewed.   Adult ECG Report -Not checked  Other studies Reviewed: Additional studies/ records that were reviewed today include:   Recent Labs:   Lab Results  Component Value Date   CHOL 142 02/01/2018   HDL 34 (L) 02/01/2018   LDLCALC 87 02/01/2018   TRIG 105 02/01/2018  CHOLHDL 4.2 02/01/2018     ASSESSMENT / PLAN: Problem List Items Addressed This Visit    Coronary artery disease, non-occlusive (Chronic)    Moderate nonocclusive disease.  Is on statin and aspirin along with beta-blocker and ARB.  If there is no bleeding issues, we will continue aspirin, but his symptoms are any issues of bleeding or bruising we will stop aspirin, in light of him being on Xarelto.      Relevant Medications   losartan (COZAAR) 100 MG tablet   Other Relevant Orders   EKG 12-Lead   Dilated cardiomyopathy (HCC) (Chronic)    Resolved.  Likely related to A. fib RVR.  Continue beta-blocker and ARB. PRN diuretic      Relevant Medications   losartan (COZAAR) 100 MG tablet   Other Relevant Orders   EKG 12-Lead   Essential hypertension (Chronic)   Relevant Medications   losartan (COZAAR) 100 MG tablet   Other Relevant Orders   EKG 12-Lead   Paroxysmal atrial fibrillation ([place]): CHA2DS2-VASc Score 4 (Age, CAD, HTN, CHF) - Primary (Chronic)    On stable dose of Toprol 50 mg daily.  No sign of chronotropic incompetence.  We will continue on Xarelto for anticoagulation.  Provided he does not have any breakthrough episodes, we may not need to think about it pill in the pocket PRN medication.  At this point I think if he has issues with him being on Xarelto he can simply come in for cardioversion to the emergency room.      Relevant Medications   losartan (COZAAR) 100 MG tablet   Other Relevant Orders   EKG 12-Lead      COVID-19 Education: The signs and symptoms of COVID-19 were discussed with the patient and how to seek care for testing (follow up with PCP or arrange E-visit).   The importance of social distancing was discussed today.   I spent a total of 22 minutes with the patient and chart review. >  50% of the  time was spent in direct patient consultation.   Current medicines are reviewed at length with the patient today.  (+/- concerns) n/a  Patient Instructions  Medication Instructions:   increase losartan to 100 mg daily - new prescription sent to pharmacy    If you need a refill on your cardiac medications before your next appointment, please call your pharmacy.   Lab work: Not needed    Testing/Procedures: Not needed  Follow-Up: At [facility], you and your health needs are our priority.  As part of our continuing mission to provide you with exceptional heart care, we have created designated Provider Care Teams.  These Care Teams include your primary Cardiologist (physician) and Advanced Practice Providers (APPs -  Physician Assistants and Nurse Practitioners) who all work together to provide you with the care you need, when you need it. . You will need a follow up appointment in  6  Months- march 2021.  Please call our office 2 months in advance to schedule this appointment.  You may see Glenetta Hew, MD or one of the following Advanced Practice Providers on your designated Care Team:   . Rosaria Ferries, PA-C . Jory Sims, DNP, ANP  Any Other Special Instructions Will Be Listed Below (If Applicable).  Make an appointment to see primary before the end of the year  Studies Ordered:   Orders Placed This Encounter  Procedures  . EKG 12-Lead      Glenetta Hew, M.D., M.S. Interventional Cardiologist  Pager # [phone] Phone # [phone] [address] [place], [place] [address]   Thank you for choosing Heartcare at [facility]!!

## 2018-11-04 NOTE — Patient Instructions (Signed)
Medication Instructions:   increase losartan to 100 mg daily - new prescription sent to pharmacy    If you need a refill on your cardiac medications before your next appointment, please call your pharmacy.   Lab work: Not needed    Testing/Procedures: Not needed  Follow-Up: At [facility], you and your health needs are our priority.  As part of our continuing mission to provide you with exceptional heart care, we have created designated Provider Care Teams.  These Care Teams include your primary Cardiologist (physician) and Advanced Practice Providers (APPs -  Physician Assistants and Nurse Practitioners) who all work together to provide you with the care you need, when you need it. . You will need a follow up appointment in  6  Months- march 2021.  Please call our office 2 months in advance to schedule this appointment.  You may see Glenetta Hew, MD or one of the following Advanced Practice Providers on your designated Care Team:   . Rosaria Ferries, PA-C . Jory Sims, DNP, ANP  Any Other Special Instructions Will Be Listed Below (If Applicable).  Make an appointment to see primary before the end of the year

## 2018-11-06 ENCOUNTER — Encounter: Payer: Self-pay | Admitting: Cardiology

## 2018-11-06 NOTE — Assessment & Plan Note (Signed)
On stable dose of Toprol 50 mg daily.  No sign of chronotropic incompetence.  We will continue on Xarelto for anticoagulation.  Provided he does not have any breakthrough episodes, we may not need to think about it pill in the pocket PRN medication.  At this point I think if he has issues with him being on Xarelto he can simply come in for cardioversion to the emergency room.

## 2018-11-06 NOTE — Assessment & Plan Note (Signed)
Moderate nonocclusive disease.  Is on statin and aspirin along with beta-blocker and ARB.  If there is no bleeding issues, we will continue aspirin, but his symptoms are any issues of bleeding or bruising we will stop aspirin, in light of him being on Xarelto.

## 2018-11-06 NOTE — Assessment & Plan Note (Signed)
Resolved.  Likely related to A. fib RVR.  Continue beta-blocker and ARB. PRN diuretic

## 2018-11-23 DIAGNOSIS — D6869 Other thrombophilia: Secondary | ICD-10-CM | POA: Diagnosis not present

## 2018-11-23 DIAGNOSIS — N183 Chronic kidney disease, stage 3 (moderate): Secondary | ICD-10-CM | POA: Diagnosis not present

## 2018-11-23 DIAGNOSIS — I480 Paroxysmal atrial fibrillation: Secondary | ICD-10-CM | POA: Diagnosis not present

## 2018-11-23 DIAGNOSIS — Z23 Encounter for immunization: Secondary | ICD-10-CM | POA: Diagnosis not present

## 2018-11-23 DIAGNOSIS — Z79899 Other long term (current) drug therapy: Secondary | ICD-10-CM | POA: Diagnosis not present

## 2018-11-23 DIAGNOSIS — G4733 Obstructive sleep apnea (adult) (pediatric): Secondary | ICD-10-CM | POA: Diagnosis not present

## 2018-11-23 DIAGNOSIS — I129 Hypertensive chronic kidney disease with stage 1 through stage 4 chronic kidney disease, or unspecified chronic kidney disease: Secondary | ICD-10-CM | POA: Diagnosis not present

## 2018-11-23 DIAGNOSIS — M109 Gout, unspecified: Secondary | ICD-10-CM | POA: Diagnosis not present

## 2018-12-09 ENCOUNTER — Other Ambulatory Visit: Payer: Self-pay | Admitting: Cardiology

## 2018-12-28 DIAGNOSIS — D485 Neoplasm of uncertain behavior of skin: Secondary | ICD-10-CM | POA: Diagnosis not present

## 2018-12-28 DIAGNOSIS — D225 Melanocytic nevi of trunk: Secondary | ICD-10-CM | POA: Diagnosis not present

## 2018-12-28 DIAGNOSIS — H25813 Combined forms of age-related cataract, bilateral: Secondary | ICD-10-CM | POA: Diagnosis not present

## 2018-12-28 DIAGNOSIS — L814 Other melanin hyperpigmentation: Secondary | ICD-10-CM | POA: Diagnosis not present

## 2018-12-28 DIAGNOSIS — D2261 Melanocytic nevi of right upper limb, including shoulder: Secondary | ICD-10-CM | POA: Diagnosis not present

## 2018-12-28 DIAGNOSIS — L820 Inflamed seborrheic keratosis: Secondary | ICD-10-CM | POA: Diagnosis not present

## 2018-12-28 DIAGNOSIS — L570 Actinic keratosis: Secondary | ICD-10-CM | POA: Diagnosis not present

## 2018-12-28 DIAGNOSIS — Z85828 Personal history of other malignant neoplasm of skin: Secondary | ICD-10-CM | POA: Diagnosis not present

## 2018-12-28 DIAGNOSIS — L821 Other seborrheic keratosis: Secondary | ICD-10-CM | POA: Diagnosis not present

## 2018-12-28 DIAGNOSIS — D2262 Melanocytic nevi of left upper limb, including shoulder: Secondary | ICD-10-CM | POA: Diagnosis not present

## 2018-12-28 DIAGNOSIS — H524 Presbyopia: Secondary | ICD-10-CM | POA: Diagnosis not present

## 2018-12-28 DIAGNOSIS — D1801 Hemangioma of skin and subcutaneous tissue: Secondary | ICD-10-CM | POA: Diagnosis not present

## 2019-01-25 ENCOUNTER — Other Ambulatory Visit: Payer: Self-pay | Admitting: Student

## 2019-01-26 NOTE — Telephone Encounter (Signed)
This is Dr. Harding's pt. °

## 2019-02-08 ENCOUNTER — Other Ambulatory Visit: Payer: Self-pay | Admitting: Adult Health

## 2019-02-09 ENCOUNTER — Other Ambulatory Visit: Payer: Self-pay

## 2019-02-09 MED ORDER — RIVAROXABAN 20 MG PO TABS: ORAL_TABLET | ORAL | 1 refills | Status: DC

## 2019-02-09 NOTE — Telephone Encounter (Signed)
Rx has been sent to the pharmacy electronically. ° °

## 2019-03-06 ENCOUNTER — Other Ambulatory Visit: Payer: Self-pay | Admitting: Cardiology

## 2019-03-08 MED ORDER — METOPROLOL SUCCINATE ER 50 MG PO TB24: 50.0000 mg | ORAL_TABLET | Freq: Every day | ORAL | 0 refills | Status: DC

## 2019-03-08 NOTE — Addendum Note (Signed)
Addended by: Marlane Hatcher on: 03/08/2019 03:41 PM   Modules accepted: Orders

## 2019-03-13 ENCOUNTER — Telehealth: Payer: Self-pay | Admitting: Cardiology

## 2019-03-13 NOTE — Telephone Encounter (Signed)
Pt was told by pharmacist that the latest directive is to tame Metoprolol 1 tabe daily.  Pt would like this Contimed please he was told 2 daily.  Pt picked up a 90 day supply of this medication.  METOPROLOL 50mg 

## 2019-03-13 NOTE — Telephone Encounter (Signed)
Returned call to patient, patient wanted to clarify dosage of metoprolol succinate.   Advised per last OV note he was taking metoprolol succinate 50 mg (1 tablet) daily.    Patient verbalized understanding.    He also wanted to clarify if he was suppose to be on both ASA and Xarelto.   Reviewed medications and confirmed he is suppose to be on both Xarelto and ASA as long as he does not have any bleeding issues.   Patient agreed and verbalized understanding.

## 2019-03-28 DIAGNOSIS — G4733 Obstructive sleep apnea (adult) (pediatric): Secondary | ICD-10-CM | POA: Diagnosis not present

## 2019-03-28 DIAGNOSIS — Z9989 Dependence on other enabling machines and devices: Secondary | ICD-10-CM | POA: Diagnosis not present

## 2019-03-28 DIAGNOSIS — I480 Paroxysmal atrial fibrillation: Secondary | ICD-10-CM | POA: Diagnosis not present

## 2019-03-28 DIAGNOSIS — Z23 Encounter for immunization: Secondary | ICD-10-CM | POA: Diagnosis not present

## 2019-04-25 DIAGNOSIS — Z23 Encounter for immunization: Secondary | ICD-10-CM | POA: Diagnosis not present

## 2019-09-15 ENCOUNTER — Other Ambulatory Visit: Payer: Self-pay | Admitting: Cardiology

## 2019-09-23 ENCOUNTER — Other Ambulatory Visit: Payer: Self-pay | Admitting: Adult Health

## 2019-09-23 ENCOUNTER — Other Ambulatory Visit: Payer: Self-pay | Admitting: Cardiology

## 2019-09-25 ENCOUNTER — Other Ambulatory Visit: Payer: Self-pay | Admitting: Cardiology

## 2019-09-25 NOTE — Telephone Encounter (Signed)
New message   *STAT* If patient is at the pharmacy, call can be transferred to refill team.   1. Which medications need to be refilled? (please list name of each medication and dose if known) metoprolol succinate (TOPROL-XL) 50 MG 24 hr tablet  2. Which pharmacy/location (including street and city if local pharmacy) is medication to be sent to? [place]  3. Do they need a 30 day or 90 day supply? 28   Pt says the medication needs to be authorized  He says he is going out of town today and would like to get the medication today

## 2019-09-25 NOTE — Telephone Encounter (Signed)
Prescription refill request for Xarelto received.  Indication: Atrial Fibrillation  Last office visit: 11/04/2018 Bruce Yoder Weight: 108 kg Age: 74 Scr: 1.4 10/2018 CrCl: 72.86 ml/min   Prescription refilled

## 2019-10-04 DIAGNOSIS — S81801A Unspecified open wound, right lower leg, initial encounter: Secondary | ICD-10-CM | POA: Diagnosis not present

## 2019-10-04 DIAGNOSIS — M79661 Pain in right lower leg: Secondary | ICD-10-CM | POA: Diagnosis not present

## 2019-10-04 DIAGNOSIS — L03115 Cellulitis of right lower limb: Secondary | ICD-10-CM | POA: Diagnosis not present

## 2019-10-11 DIAGNOSIS — I480 Paroxysmal atrial fibrillation: Secondary | ICD-10-CM | POA: Diagnosis not present

## 2019-10-11 DIAGNOSIS — F325 Major depressive disorder, single episode, in full remission: Secondary | ICD-10-CM | POA: Diagnosis not present

## 2019-10-11 DIAGNOSIS — I129 Hypertensive chronic kidney disease with stage 1 through stage 4 chronic kidney disease, or unspecified chronic kidney disease: Secondary | ICD-10-CM | POA: Diagnosis not present

## 2019-10-11 DIAGNOSIS — N183 Chronic kidney disease, stage 3 unspecified: Secondary | ICD-10-CM | POA: Diagnosis not present

## 2019-10-12 DIAGNOSIS — Z20822 Contact with and (suspected) exposure to covid-19: Secondary | ICD-10-CM | POA: Diagnosis not present

## 2019-10-12 DIAGNOSIS — R0981 Nasal congestion: Secondary | ICD-10-CM | POA: Diagnosis not present

## 2019-10-13 DIAGNOSIS — F4323 Adjustment disorder with mixed anxiety and depressed mood: Secondary | ICD-10-CM | POA: Diagnosis not present

## 2019-10-18 DIAGNOSIS — I739 Peripheral vascular disease, unspecified: Secondary | ICD-10-CM | POA: Diagnosis not present

## 2019-10-18 DIAGNOSIS — M25512 Pain in left shoulder: Secondary | ICD-10-CM | POA: Diagnosis not present

## 2019-10-18 DIAGNOSIS — M19012 Primary osteoarthritis, left shoulder: Secondary | ICD-10-CM | POA: Diagnosis not present

## 2019-10-18 DIAGNOSIS — S81801D Unspecified open wound, right lower leg, subsequent encounter: Secondary | ICD-10-CM | POA: Diagnosis not present

## 2019-10-18 DIAGNOSIS — L03115 Cellulitis of right lower limb: Secondary | ICD-10-CM | POA: Diagnosis not present

## 2019-10-18 DIAGNOSIS — G8929 Other chronic pain: Secondary | ICD-10-CM | POA: Diagnosis not present

## 2019-10-31 ENCOUNTER — Other Ambulatory Visit: Payer: Self-pay | Admitting: Cardiology

## 2019-11-03 DIAGNOSIS — F4323 Adjustment disorder with mixed anxiety and depressed mood: Secondary | ICD-10-CM | POA: Diagnosis not present

## 2019-11-12 ENCOUNTER — Other Ambulatory Visit: Payer: Self-pay | Admitting: Cardiology

## 2019-11-15 ENCOUNTER — Telehealth: Payer: Self-pay | Admitting: Cardiology

## 2019-11-15 NOTE — Telephone Encounter (Signed)
Patient calling in because he has no more refills on 100 mg Losartan and would like to know if he should continue taking it.  Start Date: 11/04/18 End Date: 02/02/19 after 90 doses  Written Date: 11/04/18 Expiration Date: 11/04/19   Will send to Dr. Ellyn Hack for advisement.

## 2019-11-15 NOTE — Telephone Encounter (Signed)
LMTCB regarding patients medication not being approved

## 2019-11-15 NOTE — Telephone Encounter (Signed)
New message:    Patient calling concerning his medication not being approve. Please call patient back.

## 2019-11-16 ENCOUNTER — Telehealth: Payer: Self-pay | Admitting: Cardiology

## 2019-11-16 MED ORDER — LOSARTAN POTASSIUM 100 MG PO TABS
100.0000 mg | ORAL_TABLET | Freq: Every day | ORAL | 0 refills | Status: DC
Start: 2019-11-16 — End: 2020-02-09

## 2019-11-16 NOTE — Telephone Encounter (Signed)
Follow Up:    Pt is going out of town tomorrow and need his medicine please. If he does not need to continue taking this please let him know.

## 2019-11-16 NOTE — Telephone Encounter (Signed)
Losartan is marked as expired on his medication list.  Was last sent on 11/04/18.  I spoke with patient who confirms he has been taking and no other provider has made a change. He is also due for follow up in office. No in office visits with Dr Ellyn Hack or APP on his team available in next month.  Patient is agreeable to virtual visit if OK with provider. I told patient I would send 3 month supply of Losartan to [facility] on [address] in [place].  He is aware he will need office visit for further refills. Will send to Dr Ellyn Hack to see if virtual visit OK.  Patient will need to be called with appointment information.

## 2019-11-16 NOTE — Telephone Encounter (Signed)
See 11/16/19 phone note

## 2019-11-17 NOTE — Telephone Encounter (Signed)
I think the 53-month refill make sense.  With COVID is it hard to get people in to be seen.  We need to continue to monitor her medicines.  Glenetta Hew, MD

## 2019-11-22 NOTE — Telephone Encounter (Signed)
Per Dr Ellyn Hack either virtual or in person is fine.  DPR indicates it is OK to leave message.  I left message for patient to call to schedule either in person or virtual appointment.

## 2019-11-22 NOTE — Telephone Encounter (Signed)
Either  [facility]

## 2019-11-28 DIAGNOSIS — F4323 Adjustment disorder with mixed anxiety and depressed mood: Secondary | ICD-10-CM | POA: Diagnosis not present

## 2019-11-30 DIAGNOSIS — Z23 Encounter for immunization: Secondary | ICD-10-CM | POA: Diagnosis not present

## 2019-12-06 DIAGNOSIS — Z1389 Encounter for screening for other disorder: Secondary | ICD-10-CM | POA: Diagnosis not present

## 2019-12-06 DIAGNOSIS — Z0000 Encounter for general adult medical examination without abnormal findings: Secondary | ICD-10-CM | POA: Diagnosis not present

## 2019-12-06 DIAGNOSIS — D696 Thrombocytopenia, unspecified: Secondary | ICD-10-CM | POA: Diagnosis not present

## 2019-12-06 DIAGNOSIS — I129 Hypertensive chronic kidney disease with stage 1 through stage 4 chronic kidney disease, or unspecified chronic kidney disease: Secondary | ICD-10-CM | POA: Diagnosis not present

## 2019-12-06 DIAGNOSIS — N1831 Chronic kidney disease, stage 3a: Secondary | ICD-10-CM | POA: Diagnosis not present

## 2019-12-06 DIAGNOSIS — D6869 Other thrombophilia: Secondary | ICD-10-CM | POA: Diagnosis not present

## 2019-12-06 DIAGNOSIS — Z79899 Other long term (current) drug therapy: Secondary | ICD-10-CM | POA: Diagnosis not present

## 2019-12-06 DIAGNOSIS — I480 Paroxysmal atrial fibrillation: Secondary | ICD-10-CM | POA: Diagnosis not present

## 2019-12-06 DIAGNOSIS — G4733 Obstructive sleep apnea (adult) (pediatric): Secondary | ICD-10-CM | POA: Diagnosis not present

## 2019-12-13 ENCOUNTER — Other Ambulatory Visit: Payer: Self-pay | Admitting: Cardiology

## 2019-12-21 DIAGNOSIS — Z23 Encounter for immunization: Secondary | ICD-10-CM | POA: Diagnosis not present

## 2019-12-27 ENCOUNTER — Other Ambulatory Visit: Payer: Self-pay | Admitting: Cardiology

## 2019-12-27 DIAGNOSIS — Z85828 Personal history of other malignant neoplasm of skin: Secondary | ICD-10-CM | POA: Diagnosis not present

## 2019-12-27 DIAGNOSIS — L820 Inflamed seborrheic keratosis: Secondary | ICD-10-CM | POA: Diagnosis not present

## 2019-12-27 DIAGNOSIS — D2262 Melanocytic nevi of left upper limb, including shoulder: Secondary | ICD-10-CM | POA: Diagnosis not present

## 2019-12-27 DIAGNOSIS — I8312 Varicose veins of left lower extremity with inflammation: Secondary | ICD-10-CM | POA: Diagnosis not present

## 2019-12-27 DIAGNOSIS — L821 Other seborrheic keratosis: Secondary | ICD-10-CM | POA: Diagnosis not present

## 2019-12-27 DIAGNOSIS — D2261 Melanocytic nevi of right upper limb, including shoulder: Secondary | ICD-10-CM | POA: Diagnosis not present

## 2019-12-27 DIAGNOSIS — D1801 Hemangioma of skin and subcutaneous tissue: Secondary | ICD-10-CM | POA: Diagnosis not present

## 2019-12-27 DIAGNOSIS — I8311 Varicose veins of right lower extremity with inflammation: Secondary | ICD-10-CM | POA: Diagnosis not present

## 2019-12-27 DIAGNOSIS — L918 Other hypertrophic disorders of the skin: Secondary | ICD-10-CM | POA: Diagnosis not present

## 2019-12-27 DIAGNOSIS — L814 Other melanin hyperpigmentation: Secondary | ICD-10-CM | POA: Diagnosis not present

## 2019-12-27 DIAGNOSIS — D2271 Melanocytic nevi of right lower limb, including hip: Secondary | ICD-10-CM | POA: Diagnosis not present

## 2019-12-27 DIAGNOSIS — I872 Venous insufficiency (chronic) (peripheral): Secondary | ICD-10-CM | POA: Diagnosis not present

## 2019-12-27 NOTE — Telephone Encounter (Signed)
*  STAT* If patient is at the pharmacy, call can be transferred to refill team.   1. Which medications need to be refilled? (please list name of each medication and dose if known) Metoprolol  2. Which pharmacy/location (including street and city if local pharmacy) is medication to be sent to?[place], [place]  3. Do they need a 30 day or 90 day supply? Enough until 01-24-20 when he have an appt with ZDr Ellyn Hack- if any problem please let the pt know

## 2019-12-28 MED ORDER — METOPROLOL SUCCINATE ER 50 MG PO TB24
50.0000 mg | ORAL_TABLET | Freq: Every day | ORAL | 0 refills | Status: DC
Start: 2019-12-28 — End: 2020-03-19

## 2020-01-03 ENCOUNTER — Other Ambulatory Visit: Payer: Self-pay | Admitting: Cardiology

## 2020-01-04 DIAGNOSIS — H25813 Combined forms of age-related cataract, bilateral: Secondary | ICD-10-CM | POA: Diagnosis not present

## 2020-01-04 DIAGNOSIS — H524 Presbyopia: Secondary | ICD-10-CM | POA: Diagnosis not present

## 2020-01-19 DIAGNOSIS — I129 Hypertensive chronic kidney disease with stage 1 through stage 4 chronic kidney disease, or unspecified chronic kidney disease: Secondary | ICD-10-CM | POA: Diagnosis not present

## 2020-01-19 DIAGNOSIS — N1831 Chronic kidney disease, stage 3a: Secondary | ICD-10-CM | POA: Diagnosis not present

## 2020-01-19 DIAGNOSIS — F325 Major depressive disorder, single episode, in full remission: Secondary | ICD-10-CM | POA: Diagnosis not present

## 2020-01-19 DIAGNOSIS — I480 Paroxysmal atrial fibrillation: Secondary | ICD-10-CM | POA: Diagnosis not present

## 2020-01-24 ENCOUNTER — Ambulatory Visit (INDEPENDENT_AMBULATORY_CARE_PROVIDER_SITE_OTHER): Payer: Medicare Other | Admitting: Cardiology

## 2020-01-24 ENCOUNTER — Encounter: Payer: Self-pay | Admitting: Cardiology

## 2020-01-24 ENCOUNTER — Other Ambulatory Visit: Payer: Self-pay

## 2020-01-24 VITALS — BP 156/78 | HR 55 | Ht 70.5 in | Wt 242.0 lb

## 2020-01-24 DIAGNOSIS — I2510 Atherosclerotic heart disease of native coronary artery without angina pectoris: Secondary | ICD-10-CM | POA: Diagnosis not present

## 2020-01-24 DIAGNOSIS — I480 Paroxysmal atrial fibrillation: Secondary | ICD-10-CM | POA: Diagnosis not present

## 2020-01-24 DIAGNOSIS — I10 Essential (primary) hypertension: Secondary | ICD-10-CM

## 2020-01-24 DIAGNOSIS — I420 Dilated cardiomyopathy: Secondary | ICD-10-CM

## 2020-01-24 NOTE — Patient Instructions (Signed)
Medication Instructions:  No changes  *If you need a refill on your cardiac medications before your next appointment, please call your pharmacy*   Lab Work: not needed   Testing/Procedures: Not needed   Follow-Up: At [facility], you and your health needs are our priority.  As part of our continuing mission to provide you with exceptional heart care, we have created designated Provider Care Teams.  These Care Teams include your primary Cardiologist (physician) and Advanced Practice Providers (APPs -  Physician Assistants and Nurse Practitioners) who all work together to provide you with the care you need, when you need it.  We recommend signing up for the patient portal called "MyChart".  Sign up information is provided on this After Visit Summary.  MyChart is used to connect with patients for Virtual Visits (Telemedicine).  Patients are able to view lab/test results, encounter notes, upcoming appointments, etc.  Non-urgent messages can be sent to your provider as well.   To learn more about what you can do with MyChart, go to [email or web address].    Your next appointment:   12 month(s)  The format for your next appointment:   In Person  Provider:   Glenetta Hew, MD   Other Instructions  Keep blood pressure log of your blood pressure readings-  For the next month and take it with you to your primary - take it randomly 2 to 3 times a day

## 2020-01-24 NOTE — Progress Notes (Signed)
Primary Care Provider: Lajean Manes, MD Cardiologist: Glenetta Hew, MD Electrophysiologist: None  Clinic Note: Chief Complaint  Patient presents with  . Follow-up    Delayed annual; 65-month  . Atrial Fibrillation    No recurrent symptoms.  Doing okay on Xarelto.    Patient Active Problem List   Diagnosis Date Noted  . Dilated cardiomyopathy ([place]) - RESOLVED as of Jan 2020. 01/21/2018    Priority: High  . Paroxysmal atrial fibrillation [facility]): CHA2DS2-VASc Score 4 (Age, CAD, HTN, CHF) 09/2017    Priority: High  . Coronary artery disease, non-occlusive; without angina  Single-vessel disease with ~60% lesion in major OM branch.  FFR negative. 03/03/2018    Priority: Medium  . Solitary kidney / S/P Nephrectomy  1965  . Essential hypertension 10/27/2017    HPI:    Bruce Yoder is a 74 y.o. male with a PMH notable PROXIMAL PERSISTENT A. FIB (leading to NONISCHEMIC CARDIOMYOPATHY-now resolved), SINGLE-VESSEL NONOCCLUSIVE CAD, and HTN/HLD who presents today for delayed annual f/u.   August 2019: Diagnosed with A. Fib: Very symptomatic with CHF symptoms.-> DCCV October 2018  ECHO 11/03/2017 (EF 35 to 40% with diffuse HK with severe LVH = NICM)  CATH 01/31/2018: Moderate two-vessel CAD--60% OM, FFR negative (NICM)  January 2020: Echo post cardioversion, EF 55-60%.  Only mild LVH with normal filling pressures.  NICM RESOLVED   Bruce Yoder was last seen on 11/04/2018: Feeling great.  No major symptoms.  No recurrence of A. fib.  Doing 18 holes of golf most days a week.  Also exercise including elliptical trainer and bicycle trainer.  Maybe a little dyspneic going up hills, but not with routine activity.  No other CHF symptoms.  No bleeding on Xarelto.  Plan was to continue rate control using Toprol and Xarelto for DOAC.  Recent Hospitalizations: none  Reviewed  CV studies:    The following studies were reviewed today: (if available, images/films reviewed: From Epic Chart or  Care Everywhere) . none:  Interval History:   Bruce Yoder returns here today overall doing very well.  He recently went on a trip to [place] where he did lots of walking and has no major issues. He does note a little more short of breath that he would be at baseline, but also no short of breath bending over.  He still walks 18 holes of golf, gets short of breath walking up hills.  He has gained some weight-with dietary discretion and has cut down his exercise.  He is now trying to get back into doing his exercise routine using elliptical trainer and exercise bike.  Now riding or exercising just about every day of the week.   No further episodes of atrial fibrillation with no irregular heartbeats or palpitations.  No PND, orthopnea or edema.  No chest pain or pressure at rest or exertion.  CV Review of Symptoms (Summary): positive for - dyspnea on exertion and This is just a little more than what he previously been his baseline baseline.  He is a little bit of shape, getting better. negative for - chest pain, edema, irregular heartbeat, orthopnea, palpitations, paroxysmal nocturnal dyspnea, rapid heart rate, shortness of breath or Lightheadedness or dizziness, syncope/near syncope or TIA/amaurosis fugax, claudication  The patient does not have symptoms concerning for COVID-19 infection (fever, chills, cough, or new shortness of breath).   REVIEWED OF SYSTEMS   Review of Systems  Constitutional: Positive for malaise/fatigue (More exercise intolerance/fatigue since his trip.). Negative for weight loss (Has gained  weight).  HENT: Negative for congestion and nosebleeds.   Respiratory: Negative for shortness of breath (Only with exertion).   Gastrointestinal: Negative for blood in stool and melena.  Genitourinary: Negative for hematuria.  Musculoskeletal: Negative for falls and joint pain.  Neurological: Negative for dizziness, weakness and headaches.  Psychiatric/Behavioral: Negative for memory  loss. The patient is not nervous/anxious and does not have insomnia.    I have reviewed and (if needed) personally updated the patient's problem list, medications, allergies, past medical and surgical history, social and family history.   PAST MEDICAL HISTORY   Past Medical History:  Diagnosis Date  . Atrial fibrillation ([place])    a. diagnosed on 10/22/2017; b. successful DCCV on 12/14/2017; Paroxysmal Persistent  . Basal cell carcinoma (BCC) of back    Dr. Elvera Lennox  . CAD (coronary artery disease)    a. LHC 01/31/2018: OM2 55-60%, FFR 0.93. pLAD 25%  . CKD (chronic kidney disease) stage 3, GFR 30-59 ml/min (HCC)    Unilateral kidney  . Dilated cardiomyopathy -related to A. fib RVR; resolved 10/2017   Thought to be related to A. fib RVR: a) echo 11/03/2017 showed LVEF of 35-40% with severe LVH --> follow-up echo January 2020 showed resolution with EF 55 to 60%.  Normal filling pressures.  Read as mild as opposed to severe LVH  . Diverticulosis of colon    With polyps (Dr. Amedeo Plenty March 2017, followed by Dr. Glennon Hamilton)  . Erectile dysfunction   . Essential hypertension 2018  . Gout   . OSA on CPAP    [facility]  . Status post nephrectomy 1965  . Thrombocytopenia, unspecified (HCC)    Platelet clumping (pseudothrombocytopenia)    PAST SURGICAL HISTORY   Past Surgical History:  Procedure Laterality Date  . APPENDECTOMY     During childhood  . CARDIOVERSION N/A 12/14/2017   Procedure: CARDIOVERSION;  Surgeon: Sueanne Margarita, MD;  Location: [facility];  Service: Cardiovascular;  Laterality: N/A;  . CYST EXCISION     face  . INTRAVASCULAR PRESSURE WIRE/FFR STUDY N/A 01/31/2018   Procedure: INTRAVASCULAR PRESSURE WIRE/FFR STUDY;  Surgeon: Leonie Man, MD;  Location: [place] CV [facility];  Service: Cardiovascular;  Laterality: N/A;  . LEFT HEART CATH AND CORONARY ANGIOGRAPHY N/A 01/31/2018   Procedure: LEFT HEART CATH AND CORONARY ANGIOGRAPHY;  Surgeon:  Leonie Man, MD;  Location: [place] CV LAB;  Service: Cardiovascular: Single-vessel disease with roughly 60% lesion in major OM branch.  FFR negative.  Marland Kitchen NEPHRECTOMY Right 1965  . TRANSTHORACIC ECHOCARDIOGRAM  10/2017    EF 35 -40% with severe LVH.  Diffuse global hypokinesis.  Mild aortic and mitral regurgitation.  Mild left and right atrial dilation.  . TRANSTHORACIC ECHOCARDIOGRAM  02/2018   EF 55-60%, normal filling pressure. Mod LA dilation.  Mild to moderate TR. -> resolution of cardiomyopathy  . [place]    Immunization History  Administered Date(s) Administered  . Moderna Sars-Covid-2 Vaccination 03/28/2019, 04/25/2019   -> Recently had the booster vaccine prior to his trip to [place].  MEDICATIONS/ALLERGIES   Current Meds  Medication Sig  . allopurinol (ZYLOPRIM) 300 MG tablet Take 150 mg by mouth daily.   Marland Kitchen aspirin EC 81 MG tablet Take 1 tablet (81 mg total) by mouth daily.  Marland Kitchen atorvastatin (LIPITOR) 40 MG tablet Take 1 tablet by mouth once daily  . furosemide (LASIX) 20 MG tablet Take 20 mg tablet  by mouth daily as needed for increase swelling  .  loratadine (CLARITIN) 10 MG tablet Take 10 mg by mouth daily.  Marland Kitchen losartan (COZAAR) 100 MG tablet Take 1 tablet (100 mg total) by mouth daily.  . metoprolol succinate (TOPROL-XL) 50 MG 24 hr tablet Take 1 tablet (50 mg total) by mouth daily. Take with or immediately following a meal.  . Multiple Vitamin (MULTIVITAMIN WITH MINERALS) TABS tablet Take 1 tablet by mouth daily.  . sertraline (ZOLOFT) 100 MG tablet Take 50 mg by mouth daily.   . tadalafil (CIALIS) 5 MG tablet Take 5 mg by mouth daily as needed for erectile dysfunction.   Alveda Reasons 20 MG TABS tablet TAKE 1 TABLET BY MOUTH ONCE DAILY WITH SUPPER    No Known Allergies  SOCIAL HISTORY/FAMILY HISTORY   Reviewed in Epic:  Pertinent findings: Recently returned from [place], had a prolonged vacation.  OBJCTIVE -PE, EKG, labs   Wt Readings from Last 3  Encounters:  01/24/20 242 lb (109.8 kg)  11/04/18 238 lb 3.2 oz (108 kg)  06/09/18 230 lb (104.3 kg)    Physical Exam: BP (!) 156/78   Pulse (!) 55   Ht 5' 10.5" (1.791 m)   Wt 242 lb (109.8 kg)   BMI 34.23 kg/m  -> Blood pressure recent PCP visit was 120/70 Physical Exam Vitals reviewed.  Constitutional:      General: He is not in acute distress.    Appearance: Normal appearance. He is obese. He is not ill-appearing or toxic-appearing.     Comments: Otherwise healthy-appearing.  Well-groomed.  HENT:     Head: Normocephalic and atraumatic.  Neck:     Vascular: No carotid bruit, hepatojugular reflux or JVD.  Cardiovascular:     Rate and Rhythm: Regular rhythm. Bradycardia present.  No extrasystoles are present.    Chest Wall: PMI is not displaced.     Pulses: Normal pulses and intact distal pulses.     Heart sounds: No murmur heard. No friction rub. No gallop.   Pulmonary:     Effort: Pulmonary effort is normal. No respiratory distress.     Breath sounds: Normal breath sounds.  Chest:     Chest wall: No tenderness.  Musculoskeletal:        General: No swelling. Normal range of motion.     Cervical back: Normal range of motion and neck supple.  Neurological:     General: No focal deficit present.     Mental Status: He is alert and oriented to person, place, and time.  Psychiatric:        Mood and Affect: Mood normal.        Behavior: Behavior normal.        Thought Content: Thought content normal.        Judgment: Judgment normal.     Adult ECG Report  Rate: 55 ;  Rhythm: sinus bradycardia and Left axis deviation.  Incomplete right bundle-branch [place] with borderline LVH.;  Otherwise normal axis, normal durations.  Narrative Interpretation: Stable EKG.  Recent Labs: December 06, 2019: TC 94, TG 81, HDL 35, LDL 43.  A1c 5.1.  Hgb 14.2.  Cr 1139.  K+ 4.6. Lab Results  Component Value Date   CHOL 142 02/01/2018   HDL 34 (L) 02/01/2018   LDLCALC 87 02/01/2018   TRIG  105 02/01/2018   CHOLHDL 4.2 02/01/2018   Lab Results  Component Value Date   CREATININE 1.48 (H) 02/07/2018   BUN 28 (H) 02/07/2018   NA 138 02/07/2018   K 4.9 02/07/2018  CL 100 02/07/2018   CO2 23 02/07/2018   Lab Results  Component Value Date   TSH 3.308 12/06/2017    ASSESSMENT/PLAN    Problem List Items Addressed This Visit    Paroxysmal (Persistent) Atrial Fibrillation [facility]): CHA2DS2-VASc Score 4 (Age, CAD, HTN, CHF) - Primary (Chronic)    No recurrent episodes since cardioversion.  He was very symptomatic when he was in A. fib, therefore I would imagine that he has not had a breakthrough spells.  Resting bradycardia on Toprol 50 mg.  No change. Continue Xarelto.  As long as he has no further episodes, we can hold off on antiarrhythmic agent.  Were he to have further recurrence, will strongly consider ablation.      Relevant Orders   EKG 12-Lead (Completed)   Dilated cardiomyopathy (HCC) (Chronic)    Nonischemic, related to A. fib RVR.  Provided we can keep him out of A. fib, this should stay resolved.  He is on ARB and beta-blocker as well as stable dose of diuretic.      Coronary artery disease, non-occlusive; without angina (Chronic)    Single-vessel-OM1 disease: Moderate-nonocclusive by FFR .  Asymptomatic.  He is on atorvastatin with well-controlled lipids along with beta-blocker and ARB.  With giving him Xarelto, no need for being on aspirin.-DC aspirin.      Relevant Orders   EKG 12-Lead (Completed)   Essential hypertension (Chronic)    Blood pressure is pretty high today.  He tells me that at his PCPs office it was much better controlled.  (128/70).  He is on Toprol at 50 mg and losartan 100 mg daily.  This is in addition to as needed Lasix.  No more room to titrate Toprol or losartan.  Next line effects would be probably standing diuretic although with solitary kidney this may be problematic.  Plan he is given follow-up blood pressure log and  discuss with his PCP at his upcoming follow-up visit.  I will defer titration of medications to Dr. Felipa Eth.      Relevant Orders   EKG 12-Lead (Completed)       COVID-19 Education: The signs and symptoms of COVID-19 were discussed with the patient and how to seek care for testing (follow up with PCP or arrange E-visit).   The importance of social distancing and COVID-19 vaccination was discussed today. 2 min The patient is practicing social distancing & Masking.   I spent a total of 66minutes with the patient spent in direct patient consultation.  Additional time spent with chart review  / charting (studies, outside notes, etc): 10 Total Time: 32 min   Current medicines are reviewed at length with the patient today.  (+/- concerns) n/a  This visit occurred during the SARS-CoV-2 public health emergency.  Safety protocols were in place, including screening questions prior to the visit, additional usage of staff PPE, and extensive cleaning of exam room while observing appropriate contact time as indicated for disinfecting solutions.  Notice: This dictation was prepared with Dragon dictation along with smaller phrase technology. Any transcriptional errors that result from this process are unintentional and may not be corrected upon review.  Patient Instructions / Medication Changes & Studies & Tests Ordered   Patient Instructions  Medication Instructions:  No changes  *If you need a refill on your cardiac medications before your next appointment, please call your pharmacy*   Lab Work: not needed   Testing/Procedures: Not needed   Follow-Up: At [facility], you and your health needs  are our priority.  As part of our continuing mission to provide you with exceptional heart care, we have created designated Provider Care Teams.  These Care Teams include your primary Cardiologist (physician) and Advanced Practice Providers (APPs -  Physician Assistants and Nurse Practitioners)  who all work together to provide you with the care you need, when you need it.  We recommend signing up for the patient portal called "MyChart".  Sign up information is provided on this After Visit Summary.  MyChart is used to connect with patients for Virtual Visits (Telemedicine).  Patients are able to view lab/test results, encounter notes, upcoming appointments, etc.  Non-urgent messages can be sent to your provider as well.   To learn more about what you can do with MyChart, go to [email or web address].    Your next appointment:   12 month(s)  The format for your next appointment:   In Person  Provider:   Glenetta Hew, MD   Other Instructions  Keep blood pressure log of your blood pressure readings-  For the next month and take it with you to your primary - take it randomly 2 to 3 times a day     Studies Ordered:   Orders Placed This Encounter  Procedures  . EKG 12-Lead     Glenetta Hew, M.D., M.S. Interventional Cardiologist   Pager # [phone] Phone # [phone] [address] [place], [place] [address]   Thank you for choosing Heartcare at [facility]!!

## 2020-02-05 ENCOUNTER — Encounter: Payer: Self-pay | Admitting: Cardiology

## 2020-02-05 NOTE — Assessment & Plan Note (Signed)
Single-vessel-OM1 disease: Moderate-nonocclusive by FFR .  Asymptomatic.  He is on atorvastatin with well-controlled lipids along with beta-blocker and ARB.  With giving him Xarelto, no need for being on aspirin.-DC aspirin.

## 2020-02-05 NOTE — Assessment & Plan Note (Addendum)
Blood pressure is pretty high today.  He tells me that at his PCPs office it was much better controlled.  (128/70).  He is on Toprol at 50 mg and losartan 100 mg daily.  This is in addition to as needed Lasix.  No more room to titrate Toprol or losartan.  Next line effects would be probably standing diuretic although with solitary kidney this may be problematic.  Plan he is given follow-up blood pressure log and discuss with his PCP at his upcoming follow-up visit.  I will defer titration of medications to Dr. Felipa Eth.

## 2020-02-05 NOTE — Assessment & Plan Note (Signed)
Careful titration of medications.  Need to closely watch renal function.

## 2020-02-05 NOTE — Assessment & Plan Note (Signed)
Nonischemic, related to A. fib RVR.  Provided we can keep him out of A. fib, this should stay resolved.  He is on ARB and beta-blocker as well as stable dose of diuretic.

## 2020-02-05 NOTE — Assessment & Plan Note (Addendum)
No recurrent episodes since cardioversion.  He was very symptomatic when he was in A. fib, therefore I would imagine that he has not had a breakthrough spells.  Resting bradycardia on Toprol 50 mg.  No change. Continue Xarelto.  As long as he has no further episodes, we can hold off on antiarrhythmic agent.  Were he to have further recurrence, will strongly consider ablation.

## 2020-02-08 ENCOUNTER — Other Ambulatory Visit: Payer: Self-pay | Admitting: Cardiology

## 2020-02-14 DIAGNOSIS — N183 Chronic kidney disease, stage 3 unspecified: Secondary | ICD-10-CM | POA: Diagnosis not present

## 2020-02-14 DIAGNOSIS — M7021 Olecranon bursitis, right elbow: Secondary | ICD-10-CM | POA: Diagnosis not present

## 2020-02-14 DIAGNOSIS — M25521 Pain in right elbow: Secondary | ICD-10-CM | POA: Diagnosis not present

## 2020-02-15 DIAGNOSIS — N183 Chronic kidney disease, stage 3 unspecified: Secondary | ICD-10-CM | POA: Diagnosis not present

## 2020-02-15 DIAGNOSIS — M7021 Olecranon bursitis, right elbow: Secondary | ICD-10-CM | POA: Diagnosis not present

## 2020-02-27 DIAGNOSIS — N183 Chronic kidney disease, stage 3 unspecified: Secondary | ICD-10-CM | POA: Diagnosis not present

## 2020-02-27 DIAGNOSIS — M7021 Olecranon bursitis, right elbow: Secondary | ICD-10-CM | POA: Diagnosis not present

## 2020-03-18 ENCOUNTER — Other Ambulatory Visit: Payer: Self-pay | Admitting: Adult Health

## 2020-03-18 ENCOUNTER — Other Ambulatory Visit: Payer: Self-pay | Admitting: Cardiology

## 2020-03-19 ENCOUNTER — Other Ambulatory Visit: Payer: Self-pay

## 2020-04-08 ENCOUNTER — Other Ambulatory Visit: Payer: Self-pay | Admitting: Cardiology

## 2020-04-11 DIAGNOSIS — N183 Chronic kidney disease, stage 3 unspecified: Secondary | ICD-10-CM | POA: Diagnosis not present

## 2020-04-11 DIAGNOSIS — I129 Hypertensive chronic kidney disease with stage 1 through stage 4 chronic kidney disease, or unspecified chronic kidney disease: Secondary | ICD-10-CM | POA: Diagnosis not present

## 2020-04-11 DIAGNOSIS — I480 Paroxysmal atrial fibrillation: Secondary | ICD-10-CM | POA: Diagnosis not present

## 2020-04-11 DIAGNOSIS — F325 Major depressive disorder, single episode, in full remission: Secondary | ICD-10-CM | POA: Diagnosis not present

## 2020-04-11 DIAGNOSIS — N1831 Chronic kidney disease, stage 3a: Secondary | ICD-10-CM | POA: Diagnosis not present

## 2020-05-16 ENCOUNTER — Other Ambulatory Visit: Payer: Self-pay | Admitting: Cardiology

## 2020-06-06 DIAGNOSIS — D6869 Other thrombophilia: Secondary | ICD-10-CM | POA: Diagnosis not present

## 2020-06-06 DIAGNOSIS — I129 Hypertensive chronic kidney disease with stage 1 through stage 4 chronic kidney disease, or unspecified chronic kidney disease: Secondary | ICD-10-CM | POA: Diagnosis not present

## 2020-06-06 DIAGNOSIS — I480 Paroxysmal atrial fibrillation: Secondary | ICD-10-CM | POA: Diagnosis not present

## 2020-06-06 DIAGNOSIS — N1831 Chronic kidney disease, stage 3a: Secondary | ICD-10-CM | POA: Diagnosis not present

## 2020-06-23 ENCOUNTER — Other Ambulatory Visit: Payer: Self-pay | Admitting: Cardiology

## 2020-06-27 ENCOUNTER — Ambulatory Visit (INDEPENDENT_AMBULATORY_CARE_PROVIDER_SITE_OTHER): Payer: Medicare Other | Admitting: Cardiology

## 2020-06-27 ENCOUNTER — Encounter: Payer: Self-pay | Admitting: Cardiology

## 2020-06-27 ENCOUNTER — Other Ambulatory Visit: Payer: Self-pay

## 2020-06-27 VITALS — BP 124/82 | HR 115 | Ht 70.0 in | Wt 243.6 lb

## 2020-06-27 DIAGNOSIS — I480 Paroxysmal atrial fibrillation: Secondary | ICD-10-CM

## 2020-06-27 DIAGNOSIS — I420 Dilated cardiomyopathy: Secondary | ICD-10-CM | POA: Diagnosis not present

## 2020-06-27 DIAGNOSIS — I10 Essential (primary) hypertension: Secondary | ICD-10-CM | POA: Diagnosis not present

## 2020-06-27 DIAGNOSIS — I2510 Atherosclerotic heart disease of native coronary artery without angina pectoris: Secondary | ICD-10-CM | POA: Diagnosis not present

## 2020-06-27 DIAGNOSIS — I5043 Acute on chronic combined systolic (congestive) and diastolic (congestive) heart failure: Secondary | ICD-10-CM | POA: Diagnosis not present

## 2020-06-27 MED ORDER — AMIODARONE HCL 200 MG PO TABS: ORAL_TABLET | ORAL | 0 refills | Status: DC

## 2020-06-27 NOTE — Patient Instructions (Addendum)
Medication Instructions:     Start Amiodarone  400 mg ( 2 tablet) twice a day  For 7 days, Then 200 mg  ( 1 tablet) twice  A day for 7 days ;then 200 mg  Daily    *If you need a refill on your cardiac medications before your next appointment, please call your pharmacy*   Lab Work: Cbc Bmp today  You will need a COVID-19  test prior to your procedure. You are scheduled for May  9,2022  At  1:40 /PM.  This is a Drive Up Visit at [address] [place], [place] [address]. Someone will direct you to the appropriate testing line. Stay in your car and someone will be with you shortly.   If you have labs (blood work) drawn today and your tests are completely normal, you will receive your results only by: Marland Kitchen MyChart Message (if you have MyChart) OR . A paper copy in the mail If you have any lab test that is abnormal or we need to change your treatment, we will call you to review the results.   Testing/Procedures:  schedule for Cardioversion  -Jul 04, 2020 at  10 :74 am [facility]- endoscopy Your physician has recommended that you have a Cardioversion (DCCV). Electrical Cardioversion uses a jolt of electricity to your heart either through paddles or wired patches attached to your chest. This is a controlled, usually prescheduled, procedure. Defibrillation is done under light anesthesia in the hospital, and you usually go home the day of the procedure. This is done to get your heart back into a normal rhythm. You are not awake for the procedure. Please see the instruction sheet given to you today.     Follow-Up: At [facility], you and your health needs are our priority.  As part of our continuing mission to provide you with exceptional heart care, we have created designated Provider Care Teams.  These Care Teams include your primary Cardiologist (physician) and Advanced Practice Providers (APPs -  Physician Assistants and Nurse Practitioners) who all work together to provide you with  the care you need, when you need it.     Your next appointment:   3 week(s) it will be 2 weeks  post Cardioversion  Jul 18, 2020 1:30 AM  The format for your next appointment:   In Person  Provider:   You will follow up in the [facility] located at [facility]. Your provider will be: Clint R. Marlene Lard, PA-C   Other Instructions   Dear  Mr Bruce Yoder are scheduled for a Cardioversion on Jul 04, 2020 with Dr. Radford Pax.  Please arrive at the [facility] (Main Entrance A) at [facility]: [place],  [id number] at 9:30 am.   DIET: Nothing to eat or drink after midnight except a sip of water with medications (see medication instructions below)  Medication Instructions:   DO NOT  STOP TAKING AMIODARONE  Continue your anticoagulant:  XARELTO 20 MG  You will need to continue your anticoagulant after your procedure until you are told by your  Provider that it is safe to stop   Labs: TODAY  CBC, BMP  You will need a COVID-19  test prior to your procedure. You are scheduled for Jul 01, 2020  At  1:40 PM. This is a Drive Up Visit at [address] [place],  [address]. Someone will direct you to the appropriate testing line. Stay in your car and someone  will be with you shortly.  You must have a responsible person to drive you home and stay in the waiting area during your procedure. Failure to do so could result in cancellation.  Bring your insurance cards.  *Special Note: Every effort is made to have your procedure done on time. Occasionally there are emergencies that occur at the hospital that may cause delays. Please be patient if a delay does occur.

## 2020-06-27 NOTE — Assessment & Plan Note (Signed)
Moderate single-vessel disease in the OM that was FFR on cath.  Remains on atorvastatin beta-blocker and ARB.  Not on aspirin because of Xarelto.    Presence of CAD would likely preclude use of procainamide or flecainide.

## 2020-06-27 NOTE — Assessment & Plan Note (Signed)
Recurrent A. fib.  Unknown duration, but he has not missed any Xarelto in the last month.  I think he is probably midodrine in A. fib for a couple months now based on his symptoms.  His major symptom is dyspnea and edema.  He does have some CHF symptoms exertional dyspnea but no anginal symptoms.  He difficult assess PND orthopnea because of OSA.  At this point I think based on his previous issues with A. fib related cardiomyopathy, we need to expedite getting him out of A. fib and then plan to refer to A. fib clinic-EP to discuss the possibility of antiarrhythmic therapy versus ablation.  With his solitary kidney, I do not think that sotalol takes it would be good options. He and his granddaughter had asked about the possibility of atrial ablation which sounds to be good option to me based on his symptoms of A. Fib. I will not plan on checking a 2D echocardiogram or any other tests unless EP recommends something as part evaluation.  Plan:  Continue both carvedilol and Xarelto  We will load with amiodarone to facilitate cardioversion: 400 mg twice daily x1 week, 200 mg twice daily x 1 week followed by 200 mg daily for 2 weeks.  Would not plan on long-term unless this is decision made by EP.  Referred to A. fib clinic-EP with consideration of possible ablation

## 2020-06-27 NOTE — Assessment & Plan Note (Signed)
Blood pressure stable today on ARB and beta-blocker current dose.  He will be taking his Lasix more frequently which also benefits blood pressure.

## 2020-06-27 NOTE — H&P (View-Only) (Signed)
Primary Care Provider: Lajean Manes, MD Cardiologist: Glenetta Hew, MD Electrophysiologist: None  Clinic Note: Chief Complaint  Patient presents with  . Atrial Fibrillation    Noted to be back in A. fib 2 weeks ago during PCP evaluation.  . Shortness of Breath    Has been more short of breath over the last several months.  Thinks that A. fib may have been going on for a while now.  . Cardiomyopathy    History of A. fib related cardiomyopathy that resolved with cardioversion.    ===================================  ASSESSMENT/PLAN   Problem List Items Addressed This Visit    Acute on chronic combined systolic and diastolic CHF (congestive heart failure) ([place])    With recurrence of A. fib, I suspect is probably at least acute diastolic heart failure, but most likely now some component of systolic heart failure as well based on previous history.  Would not recommend checking echocardiogram till he is out of A. fib, this would allow for better assessment of EF 20.  Plan: Cardioversion facilitated amiodarone.  Continue beta-blocker, ARB and standing dose of Lasix.  Since his symptoms are triggered by A. fib, consideration of ablation..      Relevant Medications   amiodarone (PACERONE) 200 MG tablet   Paroxysmal (Persistent) Atrial Fibrillation [facility]): CHA2DS2-VASc Score 4 (Age, CAD, HTN, CHF) - Primary (Chronic)    Recurrent A. fib.  Unknown duration, but he has not missed any Xarelto in the last month.  I think he is probably midodrine in A. fib for a couple months now based on his symptoms.  His major symptom is dyspnea and edema.  He does have some CHF symptoms exertional dyspnea but no anginal symptoms.  He difficult assess PND orthopnea because of OSA.  At this point I think based on his previous issues with A. fib related cardiomyopathy, we need to expedite getting him out of A. fib and then plan to refer to A. fib clinic-EP to discuss the possibility of antiarrhythmic  therapy versus ablation.  With his solitary kidney, I do not think that sotalol takes it would be good options. He and his granddaughter had asked about the possibility of atrial ablation which sounds to be good option to me based on his symptoms of A. Fib. I will not plan on checking a 2D echocardiogram or any other tests unless EP recommends something as part evaluation.  Plan:  Continue both carvedilol and Xarelto  We will load with amiodarone to facilitate cardioversion: 400 mg twice daily x1 week, 200 mg twice daily x 1 week followed by 200 mg daily for 2 weeks.  Would not plan on long-term unless this is decision made by EP.  Referred to A. fib clinic-EP with consideration of possible ablation      Relevant Medications   amiodarone (PACERONE) 200 MG tablet   Other Relevant Orders   EKG 12-Lead   CBC   Basic metabolic panel   Dilated cardiomyopathy (HCC) (Chronic)    Previous history of nausea cardiomyopathy was likely related to A. fib.  IVUS sure he probably has somewhat decreased EF right now since has been in the A. fib for couple months.  Need to get him out of A. fib.Check echocardiogram until he is in sinus rhythm.  Will await EP suggestions about whether or not we will check an echo.  He does have furosemide which I told him to increase taking his dosing  To daily if not TID.  He is on  carvedilol and losartan at stable doses.      Relevant Medications   amiodarone (PACERONE) 200 MG tablet   Other Relevant Orders   EKG 12-Lead   CBC   Basic metabolic panel   Coronary artery disease, non-occlusive; without angina (Chronic)    Moderate single-vessel disease in the OM that was FFR on cath.  Remains on atorvastatin beta-blocker and ARB.  Not on aspirin because of Xarelto.    Presence of CAD would likely preclude use of procainamide or flecainide.      Relevant Medications   amiodarone (PACERONE) 200 MG tablet   Other Relevant Orders   EKG 12-Lead   CBC   Basic  metabolic panel   Essential hypertension (Chronic)    Blood pressure stable today on ARB and beta-blocker current dose.  He will be taking his Lasix more frequently which also benefits blood pressure.      Relevant Medications   amiodarone (PACERONE) 200 MG tablet   Other Relevant Orders   EKG 12-Lead      ===================================  HPI:    Bruce Yoder is a 75 y.o. male with a  PMH notable PROXIMAL PERSISTENT A. FIB (leading to NONISCHEMIC CARDIOMYOPATHY-now resolved), SINGLE-VESSEL NONOCCLUSIVE CAD, HTN/HLD, Solitary Kidney, and OSAon CPAP, who presents today for complaints of now Recurrent A. fib, Worsening Edema and Dyspnea..   August 2019: Diagnosed with A. Fib: Very symptomatic with CHF symptoms.-> DCCV October 2018 ? ECHO 11/03/2017 (EF 35 to 40% with diffuse HK with severe LVH = NICM) ? CATH 01/31/2018: Moderate two-vessel CAD--60% OM, FFR negative (NICM) ? January 2020: Echo post cardioversion, EF 55-60%.  Only mild LVH with normal filling pressures.  NICM RESOLVED  Bruce Yoder was last seen on 01/24/2020 doing well.  He just got back from a trip to [place] where he did lots of walking and hills without any major issues.  No significant dyspnea.  He feels little more short of breath than he had been a few years ago but much better than several months leading up to it.  Walking between holes of golf.  Weight gain with dietary indiscretion.,  Trying to get back onto his elliptical trainer/bicycle.  No recurrent symptoms of A. fib.  He never really felt irregular heartbeats, he just noted dyspnea.  Recent Hospitalizations: None  Reviewed  CV studies:    The following studies were reviewed today: (if available, images/films reviewed: From Epic Chart or Care Everywhere) . None:   Interval History:   Bruce Yoder returns today with his granddaughter indicating that he has been having worsening dyspnea on exertion and even at rest now that probably has been going on  for several looking back he says it started probably maybe 4 months ago if not longer.  Over the last month or so though he has been gaining edema in his lower extremities that is definitely positional.  He has no sensation of irregular heartbeat, but does feel like he is "exercising when he is not.  He is still trying to do exercise including rowing etc., and actually notes that he is more short of breath with simple routine activities as opposed to doing more vigorous activities.  No chest pain or pressure. He symptoms CPAP, therefore it is difficult to tell if he has any PND orthopnea symptoms.  No lightheadedness or dizziness or wooziness, unless he is exerting himself.  CV Review of Symptoms (Summary) Cardiovascular ROS: positive for - dyspnea on exertion, edema, rapid heart rate, shortness of breath and  Some lightheadedness but no real dizziness syncope or near syncope.   negative for - chest pain, irregular heartbeat, orthopnea, palpitations, paroxysmal nocturnal dyspnea or No TIA or amaurosis fugax.  No melena, hematochezia, hematuria or epistaxis.  No claudication  The patient does not have symptoms concerning for COVID-19 infection (fever, chills, cough, or new shortness of breath).   REVIEWED OF SYSTEMS   Review of Systems  Constitutional: Positive for malaise/fatigue. Negative for weight loss.  HENT: Negative for congestion and nosebleeds.   Respiratory: Positive for cough and shortness of breath.   Cardiovascular: Positive for leg swelling.  Gastrointestinal: Negative for blood in stool and melena.  Genitourinary: Negative for hematuria.  Musculoskeletal: Positive for joint pain. Negative for falls.  Neurological: Positive for dizziness and weakness (Generally weak.). Negative for headaches.  Psychiatric/Behavioral: Negative for depression and memory loss. The patient is nervous/anxious and has insomnia.    I have reviewed and (if needed) personally updated the patient's problem  list, medications, allergies, past medical and surgical history, social and family history.   PAST MEDICAL HISTORY   Past Medical History:  Diagnosis Date  . Atrial fibrillation (HCC)    a. diagnosed on 10/22/2017; b. successful DCCV on 12/14/2017; Paroxysmal Persistent  . Basal cell carcinoma (BCC) of back    Dr. Doreen Beam  . CAD (coronary artery disease)    a. LHC 01/31/2018: OM2 55-60%, FFR 0.93. pLAD 25%  . CKD (chronic kidney disease) stage 3, GFR 30-59 ml/min (HCC)    Unilateral kidney  . Dilated cardiomyopathy -related to A. fib RVR; resolved 10/2017   Thought to be related to A. fib RVR: a) echo 11/03/2017 showed LVEF of 35-40% with severe LVH --> follow-up echo January 2020 showed resolution with EF 55 to 60%.  Normal filling pressures.  Read as mild as opposed to severe LVH  . Diverticulosis of colon    With polyps (Dr. Madilyn Fireman March 2017, followed by Dr. Jason Fila)  . Erectile dysfunction   . Essential hypertension 2018  . Gout   . OSA on CPAP    [facility]  . Status post nephrectomy 1965  . Thrombocytopenia, unspecified (HCC)    Platelet clumping (pseudothrombocytopenia)    PAST SURGICAL HISTORY   Past Surgical History:  Procedure Laterality Date  . APPENDECTOMY     During childhood  . CARDIOVERSION N/A 12/14/2017   Procedure: CARDIOVERSION;  Surgeon: Quintella Reichert, MD;  Location: [facility] ENDOSCOPY;  Service: Cardiovascular;  Laterality: N/A;  . CYST EXCISION     face  . INTRAVASCULAR PRESSURE WIRE/FFR STUDY N/A 01/31/2018   Procedure: INTRAVASCULAR PRESSURE WIRE/FFR STUDY;  Surgeon: Marykay Lex, MD;  Location: [facility] INVASIVE CV LAB;  Service: Cardiovascular;  Laterality: N/A;  . LEFT HEART CATH AND CORONARY ANGIOGRAPHY N/A 01/31/2018   Procedure: LEFT HEART CATH AND CORONARY ANGIOGRAPHY;  Surgeon: Marykay Lex, MD;  Location: [facility] INVASIVE CV LAB;  Service: Cardiovascular: Single-vessel disease with roughly 60% lesion in major OM branch.  FFR  negative.  [facility] Kitchen NEPHRECTOMY Right 1965  . TRANSTHORACIC ECHOCARDIOGRAM  10/2017    EF 35 -40% with severe LVH.  Diffuse global hypokinesis.  Mild aortic and mitral regurgitation.  Mild left and right atrial dilation.  . TRANSTHORACIC ECHOCARDIOGRAM  02/2018   EF 55-60%, normal filling pressure. Mod LA dilation.  Mild to moderate TR. -> resolution of cardiomyopathy  . VASECTOMY  1989    Immunization History  Administered Date(s) Administered  . Moderna Sars-Covid-2 Vaccination 03/28/2019, 04/25/2019  MEDICATIONS/ALLERGIES   Current Meds  Medication Sig  . amiodarone (PACERONE) 200 MG tablet Take 2 tablets (400 mg total) by mouth 2 (two) times daily for 7 days, THEN 1 tablet (200 mg total) 2 (two) times daily for 7 days, THEN 1 tablet (200 mg total) daily.    No Known Allergies  SOCIAL HISTORY/FAMILY HISTORY   Reviewed in Epic:  Pertinent findings:  Social History   Tobacco Use  . Smoking status: Never Smoker  . Smokeless tobacco: Never Used  Vaping Use  . Vaping Use: Never used  Substance Use Topics  . Alcohol use: Yes    Comment: moderate   . Drug use: Never   Social History   Social History Narrative   He is married now for 4 years (remarried).  He has 1 child (presumably from previous marriage -73 years old).   He lives with his wife.  He is an avid Chief Executive Officer.   He is a retired Producer, television/film/video for [employer].  (He has a BA in American studies at [employer].)   Never smoked.  Drinks 5-7 alcoholic beverages a week usually beer or hard cider.  May be occasionally a mixed drink.   He is quite active exercise at least 4 days a week for 30 minutes at a time.       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  He enjoys walking on both treadmill and in the community.  He plays golf routinely.    OBJCTIVE -PE, EKG, labs   Wt Readings from Last 3 Encounters:  06/27/20  243 lb 9.6 oz (110.5 kg)  01/24/20 242 lb (109.8 kg)  11/04/18 238 lb 3.2 oz (108 kg)    Physical Exam: BP 124/82   Pulse (!) 115   Ht 5\' 10"  (1.778 m)   Wt 243 lb 9.6 oz (110.5 kg)   SpO2 98%   BMI 34.95 kg/m  Physical Exam Constitutional:      General: He is not in acute distress.    Appearance: He is well-developed. He is obese. He is not ill-appearing or toxic-appearing.     Comments: Notably more uncomfortable/ill at ease today than baseline.  HENT:     Head: Normocephalic and atraumatic.  Neck:     Vascular: Hepatojugular reflux and JVD (8 cmH2O) present. No carotid bruit.  Cardiovascular:     Rate and Rhythm: Tachycardia present. Rhythm irregularly irregular.     Chest Wall: PMI is not displaced.     Pulses: Decreased pulses (Diminished due to edema).     Heart sounds: S1 normal and S2 normal. Heart sounds are distant. No murmur heard. No friction rub. No gallop.   Pulmonary:     Effort: No respiratory distress.     Breath sounds: Normal breath sounds. No stridor. No wheezing or rales.     Comments: Mildly increased work of breathing, nonlabored Chest:     Chest wall: No tenderness.  Abdominal:     Palpations: Abdomen is soft. There is no mass.     Tenderness: There is no abdominal tenderness.     Comments: Protuberant, obese  Musculoskeletal:     Cervical back: Normal range of motion and neck supple.  Neurological:     Mental Status: He is alert.      Adult ECG Report  Rate: 115 ;  Rhythm: atrial fibrillation and Neck-deviation.  Pulmonary pattern with incomplete RBBB.  Nonspecific ST ST and T wave changes.;   Narrative Interpretation: Major difference is that he is now in A. fib RVR.  Recent Labs:   Lipids October 2021: TC 98, TG 81, HDL 35 and LDL 43.  Hgb 14.2,   06/06/2020 Cr 1.59, K+ 5.1.  TSH 2.56. Lab Results  Component Value Date   CHOL 142 02/01/2018   HDL 34 (L) 02/01/2018   LDLCALC 87 02/01/2018   TRIG 105 02/01/2018   CHOLHDL 4.2  02/01/2018   Lab Results  Component Value Date   CREATININE 1.48 (H) 02/07/2018   BUN 28 (H) 02/07/2018   NA 138 02/07/2018   K 4.9 02/07/2018   CL 100 02/07/2018   CO2 23 02/07/2018   CBC Latest Ref Rng & Units 01/19/2018 12/06/2017  WBC 3.4 - 10.8 x10E3/uL 6.2 8.5  Hemoglobin 13.0 - 17.7 g/dL 14.9 16.1  Hematocrit 37.5 - 51.0 % 42.6 49.2  Platelets 150 - 450 x10E3/uL 110(L) 119(L)    Lab Results  Component Value Date   TSH 3.308 12/06/2017    ==================================================  COVID-19 Education: The signs and symptoms of COVID-19 were discussed with the patient and how to seek care for testing (follow up with PCP or arrange E-visit).   The importance of social distancing and COVID-19 vaccination was discussed today. The patient is practicing social distancing & Masking.   I spent a total of 45 minutes with the patient spent in direct patient consultation.  Additional time spent with chart review  / charting (studies, outside notes, etc): 51min Total Time: 61 min   Current medicines are reviewed at length with the patient today.  (+/- concerns) n/a  This visit occurred during the SARS-CoV-2 public health emergency.  Safety protocols were in place, including screening questions prior to the visit, additional usage of staff PPE, and extensive cleaning of exam room while observing appropriate contact time as indicated for disinfecting solutions.  Notice: This dictation was prepared with Dragon dictation along with smaller phrase technology. Any transcriptional errors that result from this process are unintentional and may not be corrected upon review.  Patient Instructions / Medication Changes & Studies & Tests Ordered   I discussed the procedure of atrial fibrillation cardioversion with the patient in detail.  He understands risk, benefits alternatives and indication and does agree to proceed.  I did indicate that there may be a possibility of adding TEE  procedure to this and he is agreeable to proceed.  Shared Decision Making/Informed ConsentThe risks Bruce Yoder, cardiac arrhythmias rarely resulting in the need for a temporary or permanent pacemaker, skin irritation or burns, esophageal damage, perforation (1:10,000 risk), bleeding, pharyngeal hematoma as well as other potential complications associated with conscious sedation including aspiration, arrhythmia, respiratory failure and death], benefits (treatment guidance, restoration of normal sinus rhythm, diagnostic support) and alternatives of a transesophageal echocardiogram guided cardioversion were discussed in detail with Bruce Yoder and he is willing to proceed.    Patient Instructions  Medication Instructions:     Start Amiodarone  400 mg ( 2 tablet) twice a day  For 7 days, Then 200 mg  ( 1 tablet) twice  A day for 7 days ;then 200 mg  Daily    *If you need a refill on your cardiac medications before your next appointment, please call your pharmacy*   Lab Work: Cbc Bmp today  You will need a COVID-19  test prior to your procedure. You are scheduled for May  9,2022  At  1:40 /PM.  This is a Drive Up Visit at [id number] [place], [place] [address]. Someone will direct you to the appropriate testing line. Stay in your car and someone will be with you shortly.   If you have labs (blood work) drawn today and your tests are completely normal, you will receive your results only by: Marland Kitchen MyChart Message (if you have MyChart) OR . A paper copy in the mail If you have any lab test that is abnormal or we need to change your treatment, we will call you to review the results.   Testing/Procedures:  schedule for Cardioversion  -Jul 04, 2020 at  10 :33 am [facility]- endoscopy Your physician has recommended that you have a Cardioversion (DCCV). Electrical Cardioversion uses a jolt of electricity to your heart either through paddles or wired patches attached to your chest. This is a  controlled, usually prescheduled, procedure. Defibrillation is done under light anesthesia in the hospital, and you usually go home the day of the procedure. This is done to get your heart back into a normal rhythm. You are not awake for the procedure. Please see the instruction sheet given to you today.     Follow-Up: At [facility], you and your health needs are our priority.  As part of our continuing mission to provide you with exceptional heart care, we have created designated Provider Care Teams.  These Care Teams include your primary Cardiologist (physician) and Advanced Practice Providers (APPs -  Physician Assistants and Nurse Practitioners) who all work together to provide you with the care you need, when you need it.     Your next appointment:   3 week(s) it will be 2 weeks  post Cardioversion  Jul 18, 2020 1:30 AM  The format for your next appointment:   In Person  Provider:   You will follow up in the [facility] located at [facility]. Your provider will be: Clint R. Marlene Lard, PA-C   Other Instructions   Dear  Bruce Yoder are scheduled for a Cardioversion on Jul 04, 2020 with Dr. Radford Pax.  Please arrive at the [facility] (Main Entrance A) at [facility]: [place], [place] [address] at 9:30 am.   DIET: Nothing to eat or drink after midnight except a sip of water with medications (see medication instructions below)  Medication Instructions:   DO NOT  STOP TAKING AMIODARONE  Continue your anticoagulant:  XARELTO 20 MG  You will need to continue your anticoagulant after your procedure until you are told by your  Provider that it is safe to stop   Labs: TODAY  CBC, BMP  You will need a COVID-19  test prior to your procedure. You are scheduled for Jul 01, 2020  At  1:40 PM. This is a Drive Up Visit at [id number] [place], [place] [address]. Someone will direct you to the appropriate testing line. Stay in your car and  someone will be with you shortly.  You must have a responsible person to drive you home and stay in the waiting area during your procedure. Failure to do so could result in cancellation.  Bring your insurance cards.  *Special Note: Every effort is made to have your procedure done on time. Occasionally there are emergencies that occur at the hospital that may cause delays. Please be patient if a delay does occur.         Studies Ordered:   Orders Placed This Encounter  Procedures  .  CBC  . Basic metabolic panel  . EKG 12-Lead     Glenetta Hew, M.D., M.S. Interventional Cardiologist   Pager # [phone] Phone # [phone] [address] [place], [place] [address]   Thank you for choosing [facility] at [facility]!!

## 2020-06-27 NOTE — Progress Notes (Signed)
Primary Care Provider: Lajean Manes, MD Cardiologist: Glenetta Hew, MD Electrophysiologist: None  Clinic Note: Chief Complaint  Patient presents with  . Atrial Fibrillation    Noted to be back in A. fib 2 weeks ago during PCP evaluation.  . Shortness of Breath    Has been more short of breath over the last several months.  Thinks that A. fib may have been going on for a while now.  . Cardiomyopathy    History of A. fib related cardiomyopathy that resolved with cardioversion.    ===================================  ASSESSMENT/PLAN   Problem List Items Addressed This Visit    Acute on chronic combined systolic and diastolic CHF (congestive heart failure) ([place])    With recurrence of A. fib, I suspect is probably at least acute diastolic heart failure, but most likely now some component of systolic heart failure as well based on previous history.  Would not recommend checking echocardiogram till he is out of A. fib, this would allow for better assessment of EF 20.  Plan: Cardioversion facilitated amiodarone.  Continue beta-blocker, ARB and standing dose of Lasix.  Since his symptoms are triggered by A. fib, consideration of ablation..      Relevant Medications   amiodarone (PACERONE) 200 MG tablet   Paroxysmal (Persistent) Atrial Fibrillation [facility]): CHA2DS2-VASc Score 4 (Age, CAD, HTN, CHF) - Primary (Chronic)    Recurrent A. fib.  Unknown duration, but he has not missed any Xarelto in the last month.  I think he is probably midodrine in A. fib for a couple months now based on his symptoms.  His major symptom is dyspnea and edema.  He does have some CHF symptoms exertional dyspnea but no anginal symptoms.  He difficult assess PND orthopnea because of OSA.  At this point I think based on his previous issues with A. fib related cardiomyopathy, we need to expedite getting him out of A. fib and then plan to refer to A. fib clinic-EP to discuss the possibility of antiarrhythmic  therapy versus ablation.  With his solitary kidney, I do not think that sotalol takes it would be good options. He and his granddaughter had asked about the possibility of atrial ablation which sounds to be good option to me based on his symptoms of A. Fib. I will not plan on checking a 2D echocardiogram or any other tests unless EP recommends something as part evaluation.  Plan:  Continue both carvedilol and Xarelto  We will load with amiodarone to facilitate cardioversion: 400 mg twice daily x1 week, 200 mg twice daily x 1 week followed by 200 mg daily for 2 weeks.  Would not plan on long-term unless this is decision made by EP.  Referred to A. fib clinic-EP with consideration of possible ablation      Relevant Medications   amiodarone (PACERONE) 200 MG tablet   Other Relevant Orders   EKG 12-Lead   CBC   Basic metabolic panel   Dilated cardiomyopathy (HCC) (Chronic)    Previous history of nausea cardiomyopathy was likely related to A. fib.  IVUS sure he probably has somewhat decreased EF right now since has been in the A. fib for couple months.  Need to get him out of A. fib.Check echocardiogram until he is in sinus rhythm.  Will await EP suggestions about whether or not we will check an echo.  He does have furosemide which I told him to increase taking his dosing  To daily if not TID.  He is on  carvedilol and losartan at stable doses.      Relevant Medications   amiodarone (PACERONE) 200 MG tablet   Other Relevant Orders   EKG 12-Lead   CBC   Basic metabolic panel   Coronary artery disease, non-occlusive; without angina (Chronic)    Moderate single-vessel disease in the OM that was FFR on cath.  Remains on atorvastatin beta-blocker and ARB.  Not on aspirin because of Xarelto.    Presence of CAD would likely preclude use of procainamide or flecainide.      Relevant Medications   amiodarone (PACERONE) 200 MG tablet   Other Relevant Orders   EKG 12-Lead   CBC   Basic  metabolic panel   Essential hypertension (Chronic)    Blood pressure stable today on ARB and beta-blocker current dose.  He will be taking his Lasix more frequently which also benefits blood pressure.      Relevant Medications   amiodarone (PACERONE) 200 MG tablet   Other Relevant Orders   EKG 12-Lead      ===================================  HPI:    Bruce Yoder is a 75 y.o. male with a  PMH notable PROXIMAL PERSISTENT A. FIB (leading to NONISCHEMIC CARDIOMYOPATHY-now resolved), SINGLE-VESSEL NONOCCLUSIVE CAD, HTN/HLD, Solitary Kidney, and OSAon CPAP, who presents today for complaints of now Recurrent A. fib, Worsening Edema and Dyspnea..   August 2019: Diagnosed with A. Fib: Very symptomatic with CHF symptoms.-> DCCV October 2018 ? ECHO 11/03/2017 (EF 35 to 40% with diffuse HK with severe LVH = NICM) ? CATH 01/31/2018: Moderate two-vessel CAD--60% OM, FFR negative (NICM) ? January 2020: Echo post cardioversion, EF 55-60%.  Only mild LVH with normal filling pressures.  NICM RESOLVED  Bruce Yoder was last seen on 01/24/2020 doing well.  He just got back from a trip to [place] where he did lots of walking and hills without any major issues.  No significant dyspnea.  He feels little more short of breath than he had been a few years ago but much better than several months leading up to it.  Walking between holes of golf.  Weight gain with dietary indiscretion.,  Trying to get back onto his elliptical trainer/bicycle.  No recurrent symptoms of A. fib.  He never really felt irregular heartbeats, he just noted dyspnea.  Recent Hospitalizations: None  Reviewed  CV studies:    The following studies were reviewed today: (if available, images/films reviewed: From Epic Chart or Care Everywhere) . None:   Interval History:   Bruce Yoder returns today with his granddaughter indicating that he has been having worsening dyspnea on exertion and even at rest now that probably has been going on  for several looking back he says it started probably maybe 4 months ago if not longer.  Over the last month or so though he has been gaining edema in his lower extremities that is definitely positional.  He has no sensation of irregular heartbeat, but does feel like he is "exercising when he is not.  He is still trying to do exercise including rowing etc., and actually notes that he is more short of breath with simple routine activities as opposed to doing more vigorous activities.  No chest pain or pressure. He symptoms CPAP, therefore it is difficult to tell if he has any PND orthopnea symptoms.  No lightheadedness or dizziness or wooziness, unless he is exerting himself.  CV Review of Symptoms (Summary) Cardiovascular ROS: positive for - dyspnea on exertion, edema, rapid heart rate, shortness of breath and  Some lightheadedness but no real dizziness syncope or near syncope.   negative for - chest pain, irregular heartbeat, orthopnea, palpitations, paroxysmal nocturnal dyspnea or No TIA or amaurosis fugax.  No melena, hematochezia, hematuria or epistaxis.  No claudication  The patient does not have symptoms concerning for COVID-19 infection (fever, chills, cough, or new shortness of breath).   REVIEWED OF SYSTEMS   Review of Systems  Constitutional: Positive for malaise/fatigue. Negative for weight loss.  HENT: Negative for congestion and nosebleeds.   Respiratory: Positive for cough and shortness of breath.   Cardiovascular: Positive for leg swelling.  Gastrointestinal: Negative for blood in stool and melena.  Genitourinary: Negative for hematuria.  Musculoskeletal: Positive for joint pain. Negative for falls.  Neurological: Positive for dizziness and weakness (Generally weak.). Negative for headaches.  Psychiatric/Behavioral: Negative for depression and memory loss. The patient is nervous/anxious and has insomnia.    I have reviewed and (if needed) personally updated the patient's problem  list, medications, allergies, past medical and surgical history, social and family history.   PAST MEDICAL HISTORY   Past Medical History:  Diagnosis Date  . Atrial fibrillation (HCC)    a. diagnosed on 10/22/2017; b. successful DCCV on 12/14/2017; Paroxysmal Persistent  . Basal cell carcinoma (BCC) of back    Dr. Whitworth  . CAD (coronary artery disease)    a. LHC 01/31/2018: OM2 55-60%, FFR 0.93. pLAD 25%  . CKD (chronic kidney disease) stage 3, GFR 30-59 ml/min (HCC)    Unilateral kidney  . Dilated cardiomyopathy -related to A. fib RVR; resolved 10/2017   Thought to be related to A. fib RVR: a) echo 11/03/2017 showed LVEF of 35-40% with severe LVH --> follow-up echo January 2020 showed resolution with EF 55 to 60%.  Normal filling pressures.  Read as mild as opposed to severe LVH  . Diverticulosis of colon    With polyps (Dr. Hayes March 2017, followed by Dr. Bray)  . Erectile dysfunction   . Essential hypertension 2018  . Gout   . OSA on CPAP    [facility]  . Status post nephrectomy 1965  . Thrombocytopenia, unspecified (HCC)    Platelet clumping (pseudothrombocytopenia)    PAST SURGICAL HISTORY   Past Surgical History:  Procedure Laterality Date  . APPENDECTOMY     During childhood  . CARDIOVERSION N/A 12/14/2017   Procedure: CARDIOVERSION;  Surgeon: Turner, Traci R, MD;  Location: [facility] ENDOSCOPY;  Service: Cardiovascular;  Laterality: N/A;  . CYST EXCISION     face  . INTRAVASCULAR PRESSURE WIRE/FFR STUDY N/A 01/31/2018   Procedure: INTRAVASCULAR PRESSURE WIRE/FFR STUDY;  Surgeon: Maddock Finigan W, MD;  Location: [facility] INVASIVE CV LAB;  Service: Cardiovascular;  Laterality: N/A;  . LEFT HEART CATH AND CORONARY ANGIOGRAPHY N/A 01/31/2018   Procedure: LEFT HEART CATH AND CORONARY ANGIOGRAPHY;  Surgeon: Jimmye Wisnieski W, MD;  Location: [facility] INVASIVE CV LAB;  Service: Cardiovascular: Single-vessel disease with roughly 60% lesion in major OM branch.  FFR  negative.  . NEPHRECTOMY Right 1965  . TRANSTHORACIC ECHOCARDIOGRAM  10/2017    EF 35 -40% with severe LVH.  Diffuse global hypokinesis.  Mild aortic and mitral regurgitation.  Mild left and right atrial dilation.  . TRANSTHORACIC ECHOCARDIOGRAM  02/2018   EF 55-60%, normal filling pressure. Mod LA dilation.  Mild to moderate TR. -> resolution of cardiomyopathy  . VASECTOMY  1989    Immunization History  Administered Date(s) Administered  . Moderna Sars-Covid-2 Vaccination 03/28/2019, 04/25/2019      MEDICATIONS/ALLERGIES   Current Meds  Medication Sig  . amiodarone (PACERONE) 200 MG tablet Take 2 tablets (400 mg total) by mouth 2 (two) times daily for 7 days, THEN 1 tablet (200 mg total) 2 (two) times daily for 7 days, THEN 1 tablet (200 mg total) daily.    No Known Allergies  SOCIAL HISTORY/FAMILY HISTORY   Reviewed in Epic:  Pertinent findings:  Social History   Tobacco Use  . Smoking status: Never Smoker  . Smokeless tobacco: Never Used  Vaping Use  . Vaping Use: Never used  Substance Use Topics  . Alcohol use: Yes    Comment: moderate   . Drug use: Never   Social History   Social History Narrative   He is married now for 4 years (remarried).  He has 1 child (presumably from previous marriage -73 years old).   He lives with his wife.  He is an avid Chief Executive Officer.   He is a retired Producer, television/film/video for [employer].  (He has a BA in American studies at [employer].)   Never smoked.  Drinks 5-7 alcoholic beverages a week usually beer or hard cider.  May be occasionally a mixed drink.   He is quite active exercise at least 4 days a week for 30 minutes at a time.       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  He enjoys walking on both treadmill and in the community.  He plays golf routinely.    OBJCTIVE -PE, EKG, labs   Wt Readings from Last 3 Encounters:  06/27/20  243 lb 9.6 oz (110.5 kg)  01/24/20 242 lb (109.8 kg)  11/04/18 238 lb 3.2 oz (108 kg)    Physical Exam: BP 124/82   Pulse (!) 115   Ht 5\' 10"  (1.778 m)   Wt 243 lb 9.6 oz (110.5 kg)   SpO2 98%   BMI 34.95 kg/m  Physical Exam Constitutional:      General: He is not in acute distress.    Appearance: He is well-developed. He is obese. He is not ill-appearing or toxic-appearing.     Comments: Notably more uncomfortable/ill at ease today than baseline.  HENT:     Head: Normocephalic and atraumatic.  Neck:     Vascular: Hepatojugular reflux and JVD (8 cmH2O) present. No carotid bruit.  Cardiovascular:     Rate and Rhythm: Tachycardia present. Rhythm irregularly irregular.     Chest Wall: PMI is not displaced.     Pulses: Decreased pulses (Diminished due to edema).     Heart sounds: S1 normal and S2 normal. Heart sounds are distant. No murmur heard. No friction rub. No gallop.   Pulmonary:     Effort: No respiratory distress.     Breath sounds: Normal breath sounds. No stridor. No wheezing or rales.     Comments: Mildly increased work of breathing, nonlabored Chest:     Chest wall: No tenderness.  Abdominal:     Palpations: Abdomen is soft. There is no mass.     Tenderness: There is no abdominal tenderness.     Comments: Protuberant, obese  Musculoskeletal:     Cervical back: Normal range of motion and neck supple.  Neurological:     Mental Status: He is alert.      Adult ECG Report  Rate: 115 ;  Rhythm: atrial fibrillation and Neck-deviation.  Pulmonary pattern with incomplete RBBB.  Nonspecific ST ST and T wave changes.;   Narrative Interpretation: Major difference is that he is now in A. fib RVR.  Recent Labs:   Lipids October 2021: TC 98, TG 81, HDL 35 and LDL 43.  Hgb 14.2,   06/06/2020 Cr 1.59, K+ 5.1.  TSH 2.56. Lab Results  Component Value Date   CHOL 142 02/01/2018   HDL 34 (L) 02/01/2018   LDLCALC 87 02/01/2018   TRIG 105 02/01/2018   CHOLHDL 4.2  02/01/2018   Lab Results  Component Value Date   CREATININE 1.48 (H) 02/07/2018   BUN 28 (H) 02/07/2018   NA 138 02/07/2018   K 4.9 02/07/2018   CL 100 02/07/2018   CO2 23 02/07/2018   CBC Latest Ref Rng & Units 01/19/2018 12/06/2017  WBC 3.4 - 10.8 x10E3/uL 6.2 8.5  Hemoglobin 13.0 - 17.7 g/dL 14.9 16.1  Hematocrit 37.5 - 51.0 % 42.6 49.2  Platelets 150 - 450 x10E3/uL 110(L) 119(L)    Lab Results  Component Value Date   TSH 3.308 12/06/2017    ==================================================  COVID-19 Education: The signs and symptoms of COVID-19 were discussed with the patient and how to seek care for testing (follow up with PCP or arrange E-visit).   The importance of social distancing and COVID-19 vaccination was discussed today. The patient is practicing social distancing & Masking.   I spent a total of 45 minutes with the patient spent in direct patient consultation.  Additional time spent with chart review  / charting (studies, outside notes, etc): 16min Total Time: 61 min   Current medicines are reviewed at length with the patient today.  (+/- concerns) n/a  This visit occurred during the SARS-CoV-2 public health emergency.  Safety protocols were in place, including screening questions prior to the visit, additional usage of staff PPE, and extensive cleaning of exam room while observing appropriate contact time as indicated for disinfecting solutions.  Notice: This dictation was prepared with Dragon dictation along with smaller phrase technology. Any transcriptional errors that result from this process are unintentional and may not be corrected upon review.  Patient Instructions / Medication Changes & Studies & Tests Ordered   I discussed the procedure of atrial fibrillation cardioversion with the patient in detail.  He understands risk, benefits alternatives and indication and does agree to proceed.  I did indicate that there may be a possibility of adding TEE  procedure to this and he is agreeable to proceed.  Shared Decision Making/Informed ConsentThe risks Bruce Yoder, cardiac arrhythmias rarely resulting in the need for a temporary or permanent pacemaker, skin irritation or burns, esophageal damage, perforation (1:10,000 risk), bleeding, pharyngeal hematoma as well as other potential complications associated with conscious sedation including aspiration, arrhythmia, respiratory failure and death], benefits (treatment guidance, restoration of normal sinus rhythm, diagnostic support) and alternatives of a transesophageal echocardiogram guided cardioversion were discussed in detail with Bruce Yoder and he is willing to proceed.    Patient Instructions  Medication Instructions:     Start Amiodarone  400 mg ( 2 tablet) twice a day  For 7 days, Then 200 mg  ( 1 tablet) twice  A day for 7 days ;then 200 mg  Daily    *If you need a refill on your cardiac medications before your next appointment, please call your pharmacy*   Lab Work: Cbc Bmp today  You will need a COVID-19  test prior to your procedure. You are scheduled for May  9,2022  At  1:40 /PM.  This is a Drive Up Visit at [id number] [place], [place] [address]. Someone will direct you to the appropriate testing line. Stay in your car and someone will be with you shortly.   If you have labs (blood work) drawn today and your tests are completely normal, you will receive your results only by: Marland Kitchen MyChart Message (if you have MyChart) OR . A paper copy in the mail If you have any lab test that is abnormal or we need to change your treatment, we will call you to review the results.   Testing/Procedures:  schedule for Cardioversion  -Jul 04, 2020 at  10 :65 am [facility]- endoscopy Your physician has recommended that you have a Cardioversion (DCCV). Electrical Cardioversion uses a jolt of electricity to your heart either through paddles or wired patches attached to your chest. This is a  controlled, usually prescheduled, procedure. Defibrillation is done under light anesthesia in the hospital, and you usually go home the day of the procedure. This is done to get your heart back into a normal rhythm. You are not awake for the procedure. Please see the instruction sheet given to you today.     Follow-Up: At [facility], you and your health needs are our priority.  As part of our continuing mission to provide you with exceptional heart care, we have created designated Provider Care Teams.  These Care Teams include your primary Cardiologist (physician) and Advanced Practice Providers (APPs -  Physician Assistants and Nurse Practitioners) who all work together to provide you with the care you need, when you need it.     Your next appointment:   3 week(s) it will be 2 weeks  post Cardioversion  Jul 18, 2020 1:30 AM  The format for your next appointment:   In Person  Provider:   You will follow up in the [facility] located at [facility]. Your provider will be: Clint R. Marlene Lard, PA-C   Other Instructions   Dear  Bruce Yoder are scheduled for a Cardioversion on Jul 04, 2020 with Dr. Radford Pax.  Please arrive at the [facility] (Main Entrance A) at [facility]: [place], [place] [address] at 9:30 am.   DIET: Nothing to eat or drink after midnight except a sip of water with medications (see medication instructions below)  Medication Instructions:   DO NOT  STOP TAKING AMIODARONE  Continue your anticoagulant:  XARELTO 20 MG  You will need to continue your anticoagulant after your procedure until you are told by your  Provider that it is safe to stop   Labs: TODAY  CBC, BMP  You will need a COVID-19  test prior to your procedure. You are scheduled for Jul 01, 2020  At  1:40 PM. This is a Drive Up Visit at [id number] [place], [place] [address]. Someone will direct you to the appropriate testing line. Stay in your car and  someone will be with you shortly.  You must have a responsible person to drive you home and stay in the waiting area during your procedure. Failure to do so could result in cancellation.  Bring your insurance cards.  *Special Note: Every effort is made to have your procedure done on time. Occasionally there are emergencies that occur at the hospital that may cause delays. Please be patient if a delay does occur.         Studies Ordered:   Orders Placed This Encounter  Procedures  .  CBC  . Basic metabolic panel  . EKG 12-Lead     Glenetta Hew, M.D., M.S. Interventional Cardiologist   Pager # [phone] Phone # [phone] [address] [place], [place] [address]   Thank you for choosing [facility] at [facility]!!

## 2020-06-27 NOTE — Assessment & Plan Note (Signed)
Previous history of nausea cardiomyopathy was likely related to A. fib.  IVUS sure he probably has somewhat decreased EF right now since has been in the A. fib for couple months.  Need to get him out of A. fib.Check echocardiogram until he is in sinus rhythm.  Will await EP suggestions about whether or not we will check an echo.  He does have furosemide which I told him to increase taking his dosing  To daily if not TID.  He is on carvedilol and losartan at stable doses.

## 2020-06-27 NOTE — Assessment & Plan Note (Signed)
With recurrence of A. fib, I suspect is probably at least acute diastolic heart failure, but most likely now some component of systolic heart failure as well based on previous history.  Would not recommend checking echocardiogram till he is out of A. fib, this would allow for better assessment of EF 20.  Plan: Cardioversion facilitated amiodarone.  Continue beta-blocker, ARB and standing dose of Lasix.  Since his symptoms are triggered by A. fib, consideration of ablation.Marland Kitchen

## 2020-06-28 LAB — CBC
Hematocrit: 46.8 % (ref 37.5–51.0)
Hemoglobin: 15.9 g/dL (ref 13.0–17.7)
MCH: 29.2 pg (ref 26.6–33.0)
MCHC: 34 g/dL (ref 31.5–35.7)
MCV: 86 fL (ref 79–97)
Platelets: 129 10*3/uL — ABNORMAL LOW (ref 150–450)
RBC: 5.44 x10E6/uL (ref 4.14–5.80)
RDW: 14.2 % (ref 11.6–15.4)
WBC: 7.2 10*3/uL (ref 3.4–10.8)

## 2020-06-28 LAB — BASIC METABOLIC PANEL
BUN/Creatinine Ratio: 19 (ref 10–24)
BUN: 28 mg/dL — ABNORMAL HIGH (ref 8–27)
CO2: 23 mmol/L (ref 20–29)
Calcium: 10.3 mg/dL — ABNORMAL HIGH (ref 8.6–10.2)
Chloride: 103 mmol/L (ref 96–106)
Creatinine, Ser: 1.49 mg/dL — ABNORMAL HIGH (ref 0.76–1.27)
Glucose: 134 mg/dL — ABNORMAL HIGH (ref 65–99)
Potassium: 4.9 mmol/L (ref 3.5–5.2)
Sodium: 141 mmol/L (ref 134–144)
eGFR: 49 mL/min/{1.73_m2} — ABNORMAL LOW (ref >59)

## 2020-07-01 ENCOUNTER — Other Ambulatory Visit (HOSPITAL_COMMUNITY)
Admission: RE | Admit: 2020-07-01 | Discharge: 2020-07-01 | Disposition: A | Discharge status: Home / Self Care | Payer: Medicare Other | Source: Ambulatory Visit | Attending: Cardiology | Admitting: Cardiology

## 2020-07-01 DIAGNOSIS — Z20822 Contact with and (suspected) exposure to covid-19: Secondary | ICD-10-CM | POA: Diagnosis not present

## 2020-07-01 DIAGNOSIS — Z01812 Encounter for preprocedural laboratory examination: Secondary | ICD-10-CM | POA: Insufficient documentation

## 2020-07-01 DIAGNOSIS — Z79899 Other long term (current) drug therapy: Secondary | ICD-10-CM | POA: Diagnosis not present

## 2020-07-02 LAB — SARS CORONAVIRUS 2 (TAT 6-24 HRS): SARS Coronavirus 2: NEGATIVE

## 2020-07-04 ENCOUNTER — Ambulatory Visit (HOSPITAL_COMMUNITY)
Admission: RE | Admit: 2020-07-04 | Discharge: 2020-07-04 | Disposition: A | Discharge status: Home / Self Care | Payer: Medicare Other | Source: Ambulatory Visit | Attending: Cardiology | Admitting: Cardiology

## 2020-07-04 ENCOUNTER — Other Ambulatory Visit: Payer: Self-pay

## 2020-07-04 ENCOUNTER — Ambulatory Visit (HOSPITAL_COMMUNITY): Payer: Medicare Other | Admitting: Anesthesiology

## 2020-07-04 ENCOUNTER — Encounter (HOSPITAL_COMMUNITY): Payer: Self-pay | Admitting: Cardiology

## 2020-07-04 ENCOUNTER — Encounter (HOSPITAL_COMMUNITY)
Admission: RE | Disposition: A | Discharge status: Home / Self Care | Payer: Self-pay | Source: Ambulatory Visit | Attending: Cardiology

## 2020-07-04 ENCOUNTER — Telehealth: Payer: Self-pay

## 2020-07-04 DIAGNOSIS — I5043 Acute on chronic combined systolic (congestive) and diastolic (congestive) heart failure: Secondary | ICD-10-CM | POA: Insufficient documentation

## 2020-07-04 DIAGNOSIS — I4891 Unspecified atrial fibrillation: Secondary | ICD-10-CM | POA: Diagnosis present

## 2020-07-04 DIAGNOSIS — I2510 Atherosclerotic heart disease of native coronary artery without angina pectoris: Secondary | ICD-10-CM

## 2020-07-04 DIAGNOSIS — I480 Paroxysmal atrial fibrillation: Secondary | ICD-10-CM

## 2020-07-04 DIAGNOSIS — G4733 Obstructive sleep apnea (adult) (pediatric): Secondary | ICD-10-CM | POA: Diagnosis not present

## 2020-07-04 DIAGNOSIS — Z79899 Other long term (current) drug therapy: Secondary | ICD-10-CM | POA: Diagnosis not present

## 2020-07-04 DIAGNOSIS — I110 Hypertensive heart disease with heart failure: Secondary | ICD-10-CM | POA: Insufficient documentation

## 2020-07-04 DIAGNOSIS — Z905 Acquired absence of kidney: Secondary | ICD-10-CM | POA: Diagnosis not present

## 2020-07-04 DIAGNOSIS — I420 Dilated cardiomyopathy: Secondary | ICD-10-CM

## 2020-07-04 DIAGNOSIS — I130 Hypertensive heart and chronic kidney disease with heart failure and stage 1 through stage 4 chronic kidney disease, or unspecified chronic kidney disease: Secondary | ICD-10-CM | POA: Diagnosis not present

## 2020-07-04 DIAGNOSIS — I4819 Other persistent atrial fibrillation: Secondary | ICD-10-CM | POA: Diagnosis not present

## 2020-07-04 DIAGNOSIS — Z7901 Long term (current) use of anticoagulants: Secondary | ICD-10-CM | POA: Diagnosis not present

## 2020-07-04 DIAGNOSIS — N183 Chronic kidney disease, stage 3 unspecified: Secondary | ICD-10-CM | POA: Diagnosis not present

## 2020-07-04 DIAGNOSIS — I429 Cardiomyopathy, unspecified: Secondary | ICD-10-CM | POA: Insufficient documentation

## 2020-07-04 DIAGNOSIS — I10 Essential (primary) hypertension: Secondary | ICD-10-CM

## 2020-07-04 HISTORY — PX: CARDIOVERSION: SHX1299

## 2020-07-04 LAB — POCT I-STAT, CHEM 8
BUN: 48 mg/dL — ABNORMAL HIGH (ref 8–23)
Calcium, Ion: 1.35 mmol/L (ref 1.15–1.40)
Chloride: 105 mmol/L (ref 98–111)
Creatinine, Ser: 1.7 mg/dL — ABNORMAL HIGH (ref 0.61–1.24)
Glucose, Bld: 116 mg/dL — ABNORMAL HIGH (ref 70–99)
HCT: 46 % (ref 39.0–52.0)
Hemoglobin: 15.6 g/dL (ref 13.0–17.0)
Potassium: 5.1 mmol/L (ref 3.5–5.1)
Sodium: 139 mmol/L (ref 135–145)
TCO2: 25 mmol/L (ref 22–32)

## 2020-07-04 SURGERY — CARDIOVERSION
Anesthesia: General

## 2020-07-04 MED ORDER — SODIUM CHLORIDE 0.9 % IV SOLN
INTRAVENOUS | Status: DC
Start: 2020-07-04 — End: 2020-07-04

## 2020-07-04 MED ORDER — LIDOCAINE 2% (20 MG/ML) 5 ML SYRINGE
INTRAMUSCULAR | Status: DC | PRN
  Administered 2020-07-04: 60 mg via INTRAVENOUS

## 2020-07-04 MED ORDER — PROPOFOL 10 MG/ML IV BOLUS
INTRAVENOUS | Status: DC | PRN
  Administered 2020-07-04: 20 mg via INTRAVENOUS
  Administered 2020-07-04: 50 mg via INTRAVENOUS

## 2020-07-04 MED ORDER — SODIUM CHLORIDE 0.9 % IV SOLN: INTRAVENOUS | Status: DC | PRN

## 2020-07-04 NOTE — CV Procedure (Addendum)
   Electrical Cardioversion Procedure Note Bruce Yoder [id number] [birth year]  Procedure: Electrical Cardioversion Indications:  Atrial Fibrillation  Time Out: Verified patient identification, verified procedure,medications/allergies/relevent history reviewed, required imaging and test results available.  Performed  Procedure Details  The patient was NPO after midnight. Anesthesia was administered at the beside  by Bruce Yoder with 70mg  of propofol and 60mg  Lidocaine.  Cardioversion was done with synchronized biphasic defibrillation with AP pads with 150watts.  The patient converted to normal sinus rhythm. The patient tolerated the procedure well   IMPRESSION:  Successful cardioversion of atrial fibrillation  Serum creatinine bumped with increased Lasix to daily.  He only has 1 kidney.  I have instructed him to go back to PRN Lasix and will have him followup in 1 week with Bruce Yoder for [place] and BMET.    Bruce Yoder 07/04/2020, 10:34 AM

## 2020-07-04 NOTE — Transfer of Care (Signed)
Immediate Anesthesia Transfer of Care Note  Patient: Bruce Yoder  Procedure(s) Performed: CARDIOVERSION (N/A )  Patient Location: Endoscopy Unit  Anesthesia Type:General  Level of Consciousness: drowsy and patient cooperative  Airway & Oxygen Therapy: Patient Spontanous Breathing  Post-op Assessment: Report given to RN and Post -op Vital signs reviewed and stable  Post vital signs: Reviewed and stable  Last Vitals:  Vitals Value Taken Time  BP 91/60   Temp    Pulse 52   Resp 20   SpO2 95     Last Pain:  Vitals:   07/04/20 1003  TempSrc: Oral  PainSc: 0-No pain         Complications: No complications documented.

## 2020-07-04 NOTE — Telephone Encounter (Signed)
-----   Message from Sueanne Margarita, MD sent at 07/04/2020 11:09 AM EDT ----- Please set patient up to see Dr. Ellyn Hack in 1 week post cardioversion and also for BMET on Tuesday 5/17

## 2020-07-04 NOTE — Discharge Instructions (Signed)

## 2020-07-04 NOTE — Anesthesia Postprocedure Evaluation (Signed)
Anesthesia Post Note  Patient: Bruce Yoder  Procedure(s) Performed: CARDIOVERSION (N/A )     Patient location during evaluation: Endoscopy Anesthesia Type: General Level of consciousness: awake and alert Pain management: pain level controlled Vital Signs Assessment: post-procedure vital signs reviewed and stable Respiratory status: spontaneous breathing, nonlabored ventilation, respiratory function stable and patient connected to nasal cannula oxygen Cardiovascular status: blood pressure returned to baseline and stable Postop Assessment: no apparent nausea or vomiting Anesthetic complications: no   No complications documented.  Last Vitals:  Vitals:   07/04/20 1003 07/04/20 1115  BP: 120/82 91/60  Pulse: 76 (!) 48  Resp: 14 12  SpO2: 99% 98%    Last Pain:  Vitals:   07/04/20 1115  TempSrc:   PainSc: 0-No pain                 Catalina Gravel

## 2020-07-04 NOTE — Telephone Encounter (Signed)
Spoke with the pts wife and they are still at the hospital for his cardioversion.. I advised her that we will reach out to her tomorrow after he has been able to return home.   BMET ordered for next week and pts still needs appt in one week with Dr. Ellyn Hack post cardioversion per Dr. Radford Pax.

## 2020-07-04 NOTE — Interval H&P Note (Signed)
History and Physical Interval Note:  07/04/2020 10:33 AM  Bruce Yoder  has presented today for surgery, with the diagnosis of AFIB.  The various methods of treatment have been discussed with the patient and family. After consideration of risks, benefits and other options for treatment, the patient has consented to  Procedure(s): CARDIOVERSION (N/A) as a surgical intervention.  The patient's history has been reviewed, patient examined, no change in status, stable for surgery.  I have reviewed the patient's chart and labs.  Questions were answered to the patient's satisfaction.     Fransico Him

## 2020-07-04 NOTE — Anesthesia Preprocedure Evaluation (Signed)
Anesthesia Evaluation  Patient identified by MRN, date of birth, ID band Patient awake    Reviewed: Allergy & Precautions, NPO status , Patient's Chart, lab work & pertinent test results, reviewed documented beta blocker date and time   Airway Mallampati: II  TM Distance: >3 FB Neck ROM: Full    Dental  (+) Teeth Intact, Dental Advisory Given   Pulmonary sleep apnea and Continuous Positive Airway Pressure Ventilation ,    Pulmonary exam normal breath sounds clear to auscultation       Cardiovascular hypertension, Pt. on medications and Pt. on home beta blockers + CAD and +CHF  Normal cardiovascular exam+ dysrhythmias Atrial Fibrillation  Rhythm:Irregular Rate:Abnormal     Neuro/Psych negative neurological ROS  negative psych ROS   GI/Hepatic negative GI ROS, Neg liver ROS,   Endo/Other  Obesity   Renal/GU Renal InsufficiencyRenal diseaseStatus post nephrectomy     Musculoskeletal negative musculoskeletal ROS (+)   Abdominal   Peds  Hematology  (+) Blood dyscrasia (Xarelto; Thrombocytopenia), ,   Anesthesia Other Findings Day of surgery medications reviewed with the patient.  Reproductive/Obstetrics                             Anesthesia Physical Anesthesia Plan  ASA: III  Anesthesia Plan: General   Post-op Pain Management:    Induction: Intravenous  PONV Risk Score and Plan: 2 and Propofol infusion  Airway Management Planned: Mask  Additional Equipment:   Intra-op Plan:   Post-operative Plan:   Informed Consent: I have reviewed the patients History and Physical, chart, labs and discussed the procedure including the risks, benefits and alternatives for the proposed anesthesia with the patient or authorized representative who has indicated his/her understanding and acceptance.     Dental advisory given  Plan Discussed with: CRNA  Anesthesia Plan Comments:          Anesthesia Quick Evaluation

## 2020-07-04 NOTE — Telephone Encounter (Signed)
Per Dr Ellyn Hack, Patient will have keep appointment to have lab 07/09/20. Patient does not need an appointment next week with Dr Ellyn Hack ,but keep appointment with afib clinic on 07/18/20. Use previous direction of furosemide and to take as needed not everyday.   Patient is aware of instructions and verbalized understanding . States he will come to lab on 07/09/20

## 2020-07-05 ENCOUNTER — Other Ambulatory Visit: Payer: Self-pay | Admitting: Cardiology

## 2020-07-07 ENCOUNTER — Encounter (HOSPITAL_COMMUNITY): Payer: Self-pay | Admitting: Cardiology

## 2020-07-09 DIAGNOSIS — Z79899 Other long term (current) drug therapy: Secondary | ICD-10-CM | POA: Diagnosis not present

## 2020-07-09 DIAGNOSIS — I480 Paroxysmal atrial fibrillation: Secondary | ICD-10-CM | POA: Diagnosis not present

## 2020-07-09 DIAGNOSIS — I2510 Atherosclerotic heart disease of native coronary artery without angina pectoris: Secondary | ICD-10-CM | POA: Diagnosis not present

## 2020-07-10 LAB — BASIC METABOLIC PANEL
BUN/Creatinine Ratio: 14 (ref 10–24)
BUN: 25 mg/dL (ref 8–27)
CO2: 22 mmol/L (ref 20–29)
Calcium: 10.5 mg/dL — ABNORMAL HIGH (ref 8.6–10.2)
Chloride: 101 mmol/L (ref 96–106)
Creatinine, Ser: 1.77 mg/dL — ABNORMAL HIGH (ref 0.76–1.27)
Glucose: 93 mg/dL (ref 65–99)
Potassium: 5.5 mmol/L — ABNORMAL HIGH (ref 3.5–5.2)
Sodium: 140 mmol/L (ref 134–144)
eGFR: 40 mL/min/{1.73_m2} — ABNORMAL LOW (ref >59)

## 2020-07-18 ENCOUNTER — Encounter (HOSPITAL_COMMUNITY): Payer: Self-pay | Admitting: Physician Assistant

## 2020-07-18 ENCOUNTER — Other Ambulatory Visit: Payer: Self-pay

## 2020-07-18 ENCOUNTER — Ambulatory Visit (HOSPITAL_COMMUNITY)
Admission: RE | Admit: 2020-07-18 | Discharge: 2020-07-18 | Disposition: A | Discharge status: Home / Self Care | Payer: Medicare Other | Source: Ambulatory Visit | Attending: Physician Assistant | Admitting: Physician Assistant

## 2020-07-18 VITALS — BP 130/72 | HR 53 | Ht 70.0 in | Wt 246.4 lb

## 2020-07-18 DIAGNOSIS — I083 Combined rheumatic disorders of mitral, aortic and tricuspid valves: Secondary | ICD-10-CM | POA: Diagnosis not present

## 2020-07-18 DIAGNOSIS — D6869 Other thrombophilia: Secondary | ICD-10-CM | POA: Diagnosis not present

## 2020-07-18 DIAGNOSIS — I2510 Atherosclerotic heart disease of native coronary artery without angina pectoris: Secondary | ICD-10-CM | POA: Insufficient documentation

## 2020-07-18 DIAGNOSIS — N183 Chronic kidney disease, stage 3 unspecified: Secondary | ICD-10-CM | POA: Insufficient documentation

## 2020-07-18 DIAGNOSIS — I129 Hypertensive chronic kidney disease with stage 1 through stage 4 chronic kidney disease, or unspecified chronic kidney disease: Secondary | ICD-10-CM | POA: Insufficient documentation

## 2020-07-18 DIAGNOSIS — Z79899 Other long term (current) drug therapy: Secondary | ICD-10-CM | POA: Diagnosis not present

## 2020-07-18 DIAGNOSIS — I440 Atrioventricular block, first degree: Secondary | ICD-10-CM | POA: Insufficient documentation

## 2020-07-18 DIAGNOSIS — G4733 Obstructive sleep apnea (adult) (pediatric): Secondary | ICD-10-CM | POA: Insufficient documentation

## 2020-07-18 DIAGNOSIS — E669 Obesity, unspecified: Secondary | ICD-10-CM | POA: Insufficient documentation

## 2020-07-18 DIAGNOSIS — I4819 Other persistent atrial fibrillation: Secondary | ICD-10-CM | POA: Diagnosis not present

## 2020-07-18 DIAGNOSIS — I429 Cardiomyopathy, unspecified: Secondary | ICD-10-CM | POA: Insufficient documentation

## 2020-07-18 DIAGNOSIS — Z7901 Long term (current) use of anticoagulants: Secondary | ICD-10-CM | POA: Diagnosis not present

## 2020-07-18 DIAGNOSIS — Z6835 Body mass index (BMI) 35.0-35.9, adult: Secondary | ICD-10-CM | POA: Insufficient documentation

## 2020-07-18 NOTE — Progress Notes (Signed)
Primary Care Physician: Lajean Manes, MD Primary Cardiologist: Dr Ellyn Hack Primary Electrophysiologist: none Referring Physician: Dr Campbell Riches is a 75 y.o. male with a history of CAD, CKD (unilateral kidney), cardiomyopathy, HTN, OSA, and atrial fibrillation who presents for consultation in the [facility]. Patient is on Xarelto for a CHADS2VASC score of 4. He was found to be in persistent afib in 2019 with reduced EF 35-40% and underwent DCCV at that time. He had done well until follow up 06/2020 when he was found to be back in afib at a PCP visit. He was started on amiodarone and underwent DCCV on 07/04/20. He is in SR with improvement in his SOB.   Today, he denies symptoms of palpitations, chest pain, shortness of breath, orthopnea, PND, lower extremity edema, dizziness, presyncope, syncope, snoring, daytime somnolence, bleeding, or neurologic sequela. The patient is tolerating medications without difficulties and is otherwise without complaint today.    Atrial Fibrillation Risk Factors:  he does have symptoms or diagnosis of sleep apnea. he is compliant with CPAP therapy. he does not have a history of rheumatic fever. he does have a history of alcohol use.   he has a BMI of Body mass index is 35.35 kg/m.Marland Kitchen Filed Weights   07/18/20 1347  Weight: 111.8 kg    No family history on file.   Atrial Fibrillation Management history:  Previous antiarrhythmic drugs: amiodarone  Previous cardioversions: 2019, 07/04/20 Previous ablations: none CHADS2VASC score: 4 Anticoagulation history: Xarelto   Past Medical History:  Diagnosis Date  . Atrial fibrillation ([place])    a. diagnosed on 10/22/2017; b. successful DCCV on 12/14/2017; Paroxysmal Persistent  . Basal cell carcinoma (BCC) of back    Dr. Elvera Lennox  . CAD (coronary artery disease)    a. LHC 01/31/2018: OM2 55-60%, FFR 0.93. pLAD 25%  . CKD (chronic kidney disease) stage 3, GFR  30-59 ml/min (HCC)    Unilateral kidney  . Dilated cardiomyopathy -related to A. fib RVR; resolved 10/2017   Thought to be related to A. fib RVR: a) echo 11/03/2017 showed LVEF of 35-40% with severe LVH --> follow-up echo January 2020 showed resolution with EF 55 to 60%.  Normal filling pressures.  Read as mild as opposed to severe LVH  . Diverticulosis of colon    With polyps (Dr. Amedeo Plenty March 2017, followed by Dr. Glennon Hamilton)  . Erectile dysfunction   . Essential hypertension 2018  . Gout   . OSA on CPAP    [facility]  . Status post nephrectomy 1965  . Thrombocytopenia, unspecified (HCC)    Platelet clumping (pseudothrombocytopenia)   Past Surgical History:  Procedure Laterality Date  . APPENDECTOMY     During childhood  . CARDIOVERSION N/A 12/14/2017   Procedure: CARDIOVERSION;  Surgeon: Sueanne Margarita, MD;  Location[facility] END[facility]COPY;  Service: Cardiovascular;  Laterality: N/A;  . CARDIOVERSION N/A 07/04/2020   Procedure: CARDIOVERSION;  Surgeon: Sueanne Margarita, MD;  Location: [facility] ENDOSCOPY;  Service: Cardiovascular;  Laterality: N/A;  . CYST EXCISION     face  . INTRAVASCULAR PRESSURE WIRE/FFR STUDY N/A 01/31/2018   Procedure: INTRAVASCULAR PRESSURE WIRE/FFR STUDY;  Surgeon: Leonie Man, MD;  Location: [place] CV LAB;  Service: Cardiovascular;  Laterality: N/A;  . LEFT HEART CATH AND CORONARY ANGIOGRAPHY N/A 01/31/2018   Procedure: LEFT HEART CATH AND CORONARY ANGIOGRAPHY;  Surgeon: Leonie Man, MD;  Location: [place] CV LAB;  Service: Cardiovascular: Single-vessel  disease with roughly 60% lesion in major OM branch.  FFR negative.  Marland Kitchen NEPHRECTOMY Right 1965  . TRANSTHORACIC ECHOCARDIOGRAM  10/2017    EF 35 -40% with severe LVH.  Diffuse global hypokinesis.  Mild aortic and mitral regurgitation.  Mild left and right atrial dilation.  . TRANSTHORACIC ECHOCARDIOGRAM  02/2018   EF 55-60%, normal filling pressure. Mod LA dilation.  Mild to moderate  TR. -> resolution of cardiomyopathy  . VASECTOMY  1989    Current Outpatient Medications  Medication Sig Dispense Refill  . acetaminophen (TYLENOL) 500 MG tablet Take 1,000 mg by mouth every 6 (six) hours as needed for moderate pain.    Marland Kitchen allopurinol (ZYLOPRIM) 300 MG tablet Take 150 mg by mouth daily.     Marland Kitchen amiodarone (PACERONE) 200 MG tablet Take 2 tablets (400 mg total) by mouth 2 (two) times daily for 7 days, THEN 1 tablet (200 mg total) 2 (two) times daily for 7 days, THEN 1 tablet (200 mg total) daily. 102 tablet 0  . atorvastatin (LIPITOR) 40 MG tablet Take 1 tablet (40 mg total) by mouth daily. 90 tablet 3  . furosemide (LASIX) 20 MG tablet Take 20 mg tablet  by mouth daily as needed for increase swelling (Patient taking differently: No sig reported) 30 tablet 6  . loratadine (CLARITIN) 10 MG tablet Take 10 mg by mouth daily as needed for allergies.    Marland Kitchen losartan (COZAAR) 100 MG tablet Take 1 tablet by mouth once daily 90 tablet 3  . metoprolol succinate (TOPROL-XL) 50 MG 24 hr tablet TAKE 1 TABLET BY MOUTH ONCE DAILY WITH MEALS 90 tablet 3  . Multiple Vitamin (MULTIVITAMIN WITH MINERALS) TABS tablet Take 1 tablet by mouth daily.    . sertraline (ZOLOFT) 100 MG tablet Take 50 mg by mouth daily.     . tadalafil (CIALIS) 5 MG tablet Take 10-20 mg by mouth daily as needed for erectile dysfunction.    Alveda Reasons 20 MG TABS tablet TAKE 1 TABLET BY MOUTH ONCE DAILY WITH SUPPER 90 tablet 1   No current facility-administered medications for this encounter.    No Known Allergies  Social History   Socioeconomic History  . Marital status: Married    Spouse name: Not on file  . Number of children: Not on file  . Years of education: Not on file  . Highest education level: Not on file  Occupational History  . Not on file  Tobacco Use  . Smoking status: Never Smoker  . Smokeless tobacco: Never Used  Vaping Use  . Vaping Use: Never used  Substance and Sexual Activity  . Alcohol use:  Yes    Alcohol/week: 1.0 standard drink    Types: 1 Cans of beer per week  . Drug use: Never  . Sexual activity: Not on file  Other Topics Concern  . Not on file  Social History Narrative   He is married now for 4 years (remarried).  He has 1 child (presumably from previous marriage -73 years old).   He lives with his wife.  He is an avid Chief Executive Officer.   He is a retired Producer, television/film/video for [employer].  (He has a BA in American studies at [employer].)   Never smoked.  Drinks 5-7 alcoholic beverages a week usually beer or hard cider.  May be occasionally a mixed drink.   He is quite active exercise at least 4 days a week for 30 minutes at a time.  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  He enjoys walking on both treadmill and in the community.  He plays golf routinely.   Social Determinants of Health   Financial Resource Strain: Not on file  Food Insecurity: Not on file  Transportation Needs: Not on file  Physical Activity: Not on file  Stress: Not on file  Social Connections: Not on file  Intimate Partner Violence: Not on file     ROS- All systems are reviewed and negative except as per the HPI above.  Physical Exam: Vitals:   07/18/20 1347  BP: 130/72  Pulse: (!) 53  Weight: 111.8 kg  Height: 5\' 10"  (1.778 m)    GEN- The patient is a well appearing obese male, alert and oriented x 3 today.   Head- normocephalic, atraumatic Eyes-  Sclera clear, conjunctiva pink Ears- hearing intact Oropharynx- clear Neck- supple  Lungs- Clear to ausculation bilaterally, normal work of breathing Heart- Regular rate and rhythm, bradycardia, no murmurs, rubs or gallops  GI- soft, NT, ND, + BS Extremities- no clubbing, cyanosis, or edema MS- no significant deformity or atrophy Skin- no rash or lesion Psych- euthymic mood, full affect Neuro- strength and sensation are  intact  Wt Readings from Last 3 Encounters:  07/18/20 111.8 kg  07/04/20 109.3 kg  06/27/20 110.5 kg    EKG today demonstrates  SB, 1st degree AV [place] Vent. rate 53 BPM PR interval 216 ms QRS duration 110 ms QT/QTcB 462/433 ms  Echo 03/23/18 demonstrated  1. The left ventricle appears to be normal in size, have mild wall  thickness, with normal systolic function of 31-49%. Echo evidence of  normal in diastolic filling patterns.  2. Right ventricular systolic pressure is is mildly elevated.  3. The right ventricle is normal in size, has normal wall thickness and  normal systolic function.  4. Moderately dilated left atrial size.  5. Normal right atrial size.  6. Mitral valve regurgitation is mild by color flow Doppler.  7. The mitral valve normal in structure and function.  8. Normal tricuspid valve.  9. Tricuspid regurgitation mild-moderate.  10. Aortic valve normal.  11. Aortic valve regurgitation is mild by color flow Doppler.  12. The aortic root and ascending aortaare normal is size and structure.  13. The inferior vena cava was dilated in size with >50% respiratory  variablity.  14. No atrial level shunt detected by color flow Doppler.   Epic records are reviewed at length today  CHA2DS2-VASc Score = 4  The patient's score is based upon: CHF History: Yes HTN History: Yes Diabetes History: No Stroke History: No Vascular Disease History: Yes Age Score: 1 Gender Score: 0      ASSESSMENT AND PLAN: 1. Persistent Atrial Fibrillation (ICD10:  I48.19) The patient's CHA2DS2-VASc score is 4, indicating a 4.8% annual risk of stroke.   S/p DCCV 07/04/20 Patient is maintaining SR. We discussed therapeutic options today. Ideally, would not be on amiodarone long term. We discussed ablation today given his h/o of tachycardia mediated CM and he is agreeable for referral.  Continue amiodarone 200 mg daily Continue Toprol 50 mg daily Continue Xarelto 20 mg  daily  2. Secondary Hypercoagulable State (ICD10:  D68.69) The patient is at significant risk for stroke/thromboembolism based upon his CHA2DS2-VASc Score of 4.  Continue Rivaroxaban (Xarelto).   3. Obesity Body mass index is 35.35 kg/m. Lifestyle modification was discussed at length including  regular exercise and weight reduction. Referred to the [facility] program.   4. Obstructive sleep apnea The importance of adequate treatment of sleep apnea was discussed today in order to improve our ability to maintain sinus rhythm long term. Patient reports compliance with CPAP therapy.   5. HTN Stable, no changes today.  6. Cardiomyopathy Suspected tachycardia mediated with recovery of EF with restoration of SR. No signs or symptoms of fluid overload today.    Follow up with EP to discuss ablation.    [facility] [address] [place], [place] [address] [phone] 07/18/2020 1:48 PM

## 2020-07-23 ENCOUNTER — Telehealth: Payer: Self-pay | Admitting: *Deleted

## 2020-07-23 DIAGNOSIS — R899 Unspecified abnormal finding in specimens from other organs, systems and tissues: Secondary | ICD-10-CM

## 2020-07-23 DIAGNOSIS — I129 Hypertensive chronic kidney disease with stage 1 through stage 4 chronic kidney disease, or unspecified chronic kidney disease: Secondary | ICD-10-CM | POA: Diagnosis not present

## 2020-07-23 DIAGNOSIS — I480 Paroxysmal atrial fibrillation: Secondary | ICD-10-CM | POA: Diagnosis not present

## 2020-07-23 DIAGNOSIS — F325 Major depressive disorder, single episode, in full remission: Secondary | ICD-10-CM | POA: Diagnosis not present

## 2020-07-23 DIAGNOSIS — N1831 Chronic kidney disease, stage 3a: Secondary | ICD-10-CM | POA: Diagnosis not present

## 2020-07-23 DIAGNOSIS — E875 Hyperkalemia: Secondary | ICD-10-CM

## 2020-07-23 DIAGNOSIS — I420 Dilated cardiomyopathy: Secondary | ICD-10-CM

## 2020-07-23 DIAGNOSIS — I2510 Atherosclerotic heart disease of native coronary artery without angina pectoris: Secondary | ICD-10-CM

## 2020-07-23 NOTE — Telephone Encounter (Signed)
RN left a detailed message of result on patient"s secure voice mail per dpr.   BMP ordered and release. May call back for any questions.

## 2020-07-23 NOTE — Telephone Encounter (Signed)
Spoke to patient stated he has not listen to voice mail.He wants to talk to [facility] about results.Advised Ivin Booty is in clinic.I will send message to her.

## 2020-07-23 NOTE — Telephone Encounter (Signed)
-----   Message from Leonie Man, MD sent at 07/20/2020  7:41 PM EDT ----- Follow-up chemistry panel shows mild worsening of kidney function, but relatively stable.  Potassium level is above normal monitor. Taking additional dose of Lasix will help to lower the potassium level..  This should be rechecked first week of June.  Glenetta Hew, MD

## 2020-07-23 NOTE — Telephone Encounter (Signed)
Patient called back . Patient states he understood message. Patient states he is out of town until Thursday of this week June 2. Patient only takes Furosemide as needed for swelling . Patient will take a dose Friday and Saturday prior to  Labs . Patient states he will have lab done June 6,2022 at [facility]

## 2020-07-23 NOTE — Telephone Encounter (Signed)
Pt is returning call.  

## 2020-07-25 ENCOUNTER — Telehealth: Payer: Self-pay

## 2020-07-26 DIAGNOSIS — F325 Major depressive disorder, single episode, in full remission: Secondary | ICD-10-CM | POA: Diagnosis not present

## 2020-07-26 DIAGNOSIS — N1831 Chronic kidney disease, stage 3a: Secondary | ICD-10-CM | POA: Diagnosis not present

## 2020-07-26 DIAGNOSIS — I129 Hypertensive chronic kidney disease with stage 1 through stage 4 chronic kidney disease, or unspecified chronic kidney disease: Secondary | ICD-10-CM | POA: Diagnosis not present

## 2020-07-26 DIAGNOSIS — I480 Paroxysmal atrial fibrillation: Secondary | ICD-10-CM | POA: Diagnosis not present

## 2020-07-26 NOTE — Telephone Encounter (Signed)
Call to pt on 07/25/20 reference PREP referral  Explained program, times, locations program is offered currently.  Lives in [place]-locations are too far for him to participate.

## 2020-07-29 DIAGNOSIS — E875 Hyperkalemia: Secondary | ICD-10-CM | POA: Diagnosis not present

## 2020-07-29 DIAGNOSIS — I2510 Atherosclerotic heart disease of native coronary artery without angina pectoris: Secondary | ICD-10-CM | POA: Diagnosis not present

## 2020-07-29 DIAGNOSIS — R899 Unspecified abnormal finding in specimens from other organs, systems and tissues: Secondary | ICD-10-CM | POA: Diagnosis not present

## 2020-07-29 DIAGNOSIS — I420 Dilated cardiomyopathy: Secondary | ICD-10-CM | POA: Diagnosis not present

## 2020-07-30 LAB — BASIC METABOLIC PANEL
BUN/Creatinine Ratio: 16 (ref 10–24)
BUN: 31 mg/dL — ABNORMAL HIGH (ref 8–27)
CO2: 24 mmol/L (ref 20–29)
Calcium: 10.4 mg/dL — ABNORMAL HIGH (ref 8.6–10.2)
Chloride: 102 mmol/L (ref 96–106)
Creatinine, Ser: 2 mg/dL — ABNORMAL HIGH (ref 0.76–1.27)
Glucose: 91 mg/dL (ref 65–99)
Potassium: 4.4 mmol/L (ref 3.5–5.2)
Sodium: 139 mmol/L (ref 134–144)
eGFR: 35 mL/min/{1.73_m2} — ABNORMAL LOW (ref >59)

## 2020-08-02 ENCOUNTER — Other Ambulatory Visit: Payer: Self-pay

## 2020-08-02 DIAGNOSIS — I10 Essential (primary) hypertension: Secondary | ICD-10-CM

## 2020-08-02 DIAGNOSIS — I2510 Atherosclerotic heart disease of native coronary artery without angina pectoris: Secondary | ICD-10-CM

## 2020-08-02 MED ORDER — LOSARTAN POTASSIUM 50 MG PO TABS: 50.0000 mg | ORAL_TABLET | Freq: Every day | ORAL | 3 refills | Status: DC

## 2020-08-08 ENCOUNTER — Ambulatory Visit (INDEPENDENT_AMBULATORY_CARE_PROVIDER_SITE_OTHER): Payer: Medicare Other | Admitting: Cardiology

## 2020-08-08 ENCOUNTER — Other Ambulatory Visit: Payer: Self-pay

## 2020-08-08 ENCOUNTER — Encounter: Payer: Self-pay | Admitting: Cardiology

## 2020-08-08 VITALS — BP 128/70 | HR 48 | Ht 70.0 in | Wt 239.0 lb

## 2020-08-08 DIAGNOSIS — N289 Disorder of kidney and ureter, unspecified: Secondary | ICD-10-CM | POA: Diagnosis not present

## 2020-08-08 DIAGNOSIS — I10 Essential (primary) hypertension: Secondary | ICD-10-CM | POA: Diagnosis not present

## 2020-08-08 DIAGNOSIS — Z01818 Encounter for other preprocedural examination: Secondary | ICD-10-CM

## 2020-08-08 DIAGNOSIS — Z01812 Encounter for preprocedural laboratory examination: Secondary | ICD-10-CM

## 2020-08-08 DIAGNOSIS — I4819 Other persistent atrial fibrillation: Secondary | ICD-10-CM | POA: Diagnosis not present

## 2020-08-08 DIAGNOSIS — I2510 Atherosclerotic heart disease of native coronary artery without angina pectoris: Secondary | ICD-10-CM | POA: Diagnosis not present

## 2020-08-08 LAB — BASIC METABOLIC PANEL
BUN/Creatinine Ratio: 18 (ref 10–24)
BUN: 31 mg/dL — ABNORMAL HIGH (ref 8–27)
CO2: 22 mmol/L (ref 20–29)
Calcium: 10.3 mg/dL — ABNORMAL HIGH (ref 8.6–10.2)
Chloride: 102 mmol/L (ref 96–106)
Creatinine, Ser: 1.72 mg/dL — ABNORMAL HIGH (ref 0.76–1.27)
Glucose: 105 mg/dL — ABNORMAL HIGH (ref 65–99)
Potassium: 4.5 mmol/L (ref 3.5–5.2)
Sodium: 139 mmol/L (ref 134–144)
eGFR: 41 mL/min/{1.73_m2} — ABNORMAL LOW (ref >59)

## 2020-08-08 NOTE — Patient Instructions (Signed)
Medication Instructions:  Your physician recommends that you continue on your current medications as directed. Please refer to the Current Medication list given to you today.  *If you need a refill on your cardiac medications before your next appointment, please call your pharmacy*   Lab Work: Pre procedure labs on 09/16/2020.  See ablation instructions below for further details. If you have labs (blood work) drawn today and your tests are completely normal, you will receive your results only by: [facility] (if you have MyChart) OR A paper copy in the mail If you have any lab test that is abnormal or we need to change your treatment, we will call you to review the results.   Testing/Procedures: Your physician has requested that you have cardiac CT within 7 days PRIOR to your ablation. Cardiac computed tomography (CT) is a painless test that uses an x-ray machine to take clear, detailed pictures of your heart.  Please follow instruction below located under "other instructions". You will get a call from our office to schedule the date for this test.  Your physician has recommended that you have an ablation. Catheter ablation is a medical procedure used to treat some cardiac arrhythmias (irregular heartbeats). During catheter ablation, a long, thin, flexible tube is put into a blood vessel in your groin (upper thigh), or neck. This tube is called an ablation catheter. It is then guided to your heart through the blood vessel. Radio frequency waves destroy small areas of heart tissue where abnormal heartbeats may cause an arrhythmia to start. Please follow instruction below located under "other instructions".   Follow-Up: At [facility], you and your health needs are our priority.  As part of our continuing mission to provide you with exceptional heart care, we have created designated Provider Care Teams.  These Care Teams include your primary Cardiologist (physician) and Advanced Practice  Providers (APPs -  Physician Assistants and Nurse Practitioners) who all work together to provide you with the care you need, when you need it.  Your next appointment:   1 month(s) after your ablation  The format for your next appointment:   In Person  Provider:   AFib clinic  You have been referred to nephrology   Thank you for choosing [facility]!!   Trinidad Curet, RN [phone]    Other Instructions   CT INSTRUCTIONS Your cardiac CT will be scheduled at:  [facility] [address] [place],  [address] [phone]  Please arrive at the [facility] main entrance (entrance A) of [facility] 30 minutes prior to test start time. Proceed to the [facility] Radiology Department (first floor) to check-in and test prep.  Please follow these instructions carefully (unless otherwise directed):  Hold all erectile dysfunction medications at least 3 days (72 hrs) prior to test.  On the Night Before the Test: Be sure to Drink plenty of water. Do not consume any caffeinated/decaffeinated beverages or chocolate 12 hours prior to your test. Do not take any antihistamines 12 hours prior to your test.  On the Day of the Test: Drink plenty of water until 1 hour prior to the test. Do not eat any food 4 hours prior to the test. You may take your regular medications prior to the test.  HOLD Furosemide/Hydrochlorothiazide morning of the test.      After the Test: Drink plenty of water. After receiving IV contrast, you may experience a mild flushed feeling. This is normal. On occasion, you may experience  a mild rash up to 24 hours after the test. This is not dangerous. If this occurs, you can take Benadryl 25 mg and increase your fluid intake. If you experience trouble breathing, this can be serious. If it is severe call 911 IMMEDIATELY. If it is mild, please call our office. If you take any of these medications: Glipizide/Metformin, Avandament,  Glucavance, please do not take 48 hours after completing test unless otherwise instructed.   Once we have confirmed authorization from your insurance company, we will call you to set up a date and time for your test. Based on how quickly your insurance processes prior authorizations requests, please allow up to 4 weeks to be contacted for scheduling your Cardiac CT appointment. Be advised that routine Cardiac CT appointments could be scheduled as many as 8 weeks after your provider has ordered it.  For non-scheduling related questions, please contact the cardiac imaging nurse navigator should you have any questions/concerns: Marchia Bond, Cardiac Imaging Nurse Navigator Gordy Clement, Cardiac Imaging Nurse Navigator [facility] Direct Office Dial: [phone]   For scheduling needs, including cancellations and rescheduling, please call [place], [phone].     Electrophysiology/Ablation Procedure Instructions   You are scheduled for a(n)  ablation on 8/19-2022 with Dr. Allegra Lai.   1.   Pre procedure testing-             A.  LAB WORK --- On 09/16/2020 for your pre procedure blood work. You do NOT need to be fasting.  You can stop by the [employer] office anytime between 7:30 am - 4:30 pm   On the day of your procedure 10/11/2020 you will go to [facility] ([address]) at 8:30 am.  Dennis Bast will go to the main entrance A [facility] [employer]) and enter where the [employer] are.  Your driver will drop you off and you will head down the hallway to ADMITTING.  You may have one support person come in to the hospital with you.  They will be asked to wait in the waiting room. It is OK to have someone drop you off and come back when you are ready to be discharged.   3.   Do not eat or drink after midnight prior to your procedure.   4.   On the morning of your procedure do NOT take any medication. Do not miss any doses of your blood thinner prior to  the morning of your procedure or your procedure will need to be rescheduled.   5.  Plan for an overnight stay but you may be discharged after your procedure, if you use your phone frequently bring your phone charger. If you are discharged after your procedure you will need someone to drive you home and be with you for 24 hours after your procedure.   6. You will follow up with the AFIB clinic 4 weeks after your procedure.  You will follow up with Dr. Curt Bears  3 months after your procedure.  These appointments will be made for you.   7. FYI: For your safety, and to allow [place] to monitor your vital signs accurately during the surgery/procedure we request that if you have artificial nails, gel coating, SNS etc. Please have those removed prior to your surgery/procedure. Not having the nail coverings /polish removed may result in cancellation or delay of your surgery/procedure.  * If you have ANY questions please call the office [phone] and ask for [facility] RN or  send me a MyChart message   * Occasionally, EP Studies and ablations can become lengthy.  Please make your family aware of this before your procedure starts.  Average time ranges from 2-8 hours for EP studies/ablations.  Your physician will call your family after the procedure with the results.                                    Cardiac Ablation Cardiac ablation is a procedure to destroy (ablate) some heart tissue that is sending bad signals. These bad signals causeproblems in heart rhythm. The heart has many areas that make these signals. If there are problems in these areas, they can make the heart beat in a way that is not normal.Destroying some tissues can help make the heart rhythm normal. Tell your doctor about: Any allergies you have. All medicines you are taking. These include vitamins, herbs, eye drops, creams, and over-the-counter medicines. Any problems you or family members have had with medicines that make you fall asleep  (anesthetics). Any blood disorders you have. Any surgeries you have had. Any medical conditions you have, such as kidney failure. Whether you are pregnant or may be pregnant. What are the risks? This is a safe procedure. But problems may occur, including: Infection. Bruising and bleeding. Bleeding into the chest. Stroke or blood clots. Damage to nearby areas of your body. Allergies to medicines or dyes. The need for a pacemaker if the normal system is damaged. Failure of the procedure to treat the problem. What happens before the procedure? Medicines Ask your doctor about: Changing or stopping your normal medicines. This is important. Taking aspirin and ibuprofen. Do not take these medicines unless your doctor tells you to take them. Taking other medicines, vitamins, herbs, and supplements. General instructions Follow instructions from your doctor about what you cannot eat or drink. Plan to have someone take you home from the hospital or clinic. If you will be going home right after the procedure, plan to have someone with you for 24 hours. Ask your doctor what steps will be taken to prevent infection. What happens during the procedure?  An IV tube will be put into one of your veins. You will be given a medicine to help you relax. The skin on your neck or groin will be numbed. A cut (incision) will be made in your neck or groin. A needle will be put through your cut and into a large vein. A tube (catheter) will be put into the needle. The tube will be moved to your heart. Dye may be put through the tube. This helps your doctor see your heart. Small devices (electrodes) on the tube will send out signals. A type of energy will be used to destroy some heart tissue. The tube will be taken out. Pressure will be held on your cut. This helps stop bleeding. A bandage will be put over your cut. The exact procedure may vary among doctors and hospitals. What happens after the  procedure? You will be watched until you leave the hospital or clinic. This includes checking your heart rate, breathing rate, oxygen, and blood pressure. Your cut will be watched for bleeding. You will need to lie still for a few hours. Do not drive for 24 hours or as long as your doctor tells you. Summary Cardiac ablation is a procedure to destroy some heart tissue. This is done to treat heart rhythm problems. Tell  your doctor about any medical conditions you may have. Tell him or her about all medicines you are taking to treat them. This is a safe procedure. But problems may occur. These include infection, bruising, bleeding, and damage to nearby areas of your body. Follow what your doctor tells you about food and drink. You may also be told to change or stop some of your medicines. After the procedure, do not drive for 24 hours or as long as your doctor tells you. This information is not intended to replace advice given to you by your health care provider. Make sure you discuss any questions you have with your healthcare provider. Document Revised: 01/12/2019 Document Reviewed: 01/12/2019 Elsevier Patient Education  2022 [employer].

## 2020-08-08 NOTE — Progress Notes (Signed)
Electrophysiology Office Note   Date:  08/08/2020   ID:  Bruce Yoder, DOB [birth year], MRN [id number]  PCP:  Lajean Manes, MD  Cardiologist:  Ellyn Hack Primary Electrophysiologist:  Aranza Geddes Meredith Leeds, MD    Chief Complaint: AF   History of Present Illness: Bruce Yoder is a 75 y.o. male who is being seen today for the evaluation of AF at the request of Fenton, Clint R, PA. Presenting today for electrophysiology evaluation.  He has a history significant for coronary artery disease, CKD with a unilateral kidney, hypertension, OSA, and atrial fibrillation.  He is currently on Xarelto.  He was found to have persistent atrial fibrillation in 2019 with a reduced ejection fraction.  He underwent cardioversion.  He did well until he was found to be in atrial fibrillation by his PCP May 2022.  He was started on amiodarone and had a cardioversion 07/04/2020.  Today, he denies symptoms of palpitations, chest pain, shortness of breath, orthopnea, PND, lower extremity edema, claudication, dizziness, presyncope, syncope, bleeding, or neurologic sequela. The patient is tolerating medications without difficulties.    Past Medical History:  Diagnosis Date   Atrial fibrillation ([place])    a. diagnosed on 10/22/2017; b. successful DCCV on 12/14/2017; Paroxysmal Persistent   Basal cell carcinoma (BCC) of back    Dr. Elvera [place]   CAD (coronary artery disease)    a. LHC 01/31/2018: OM2 55-60%, FFR 0.93. pLAD 25%   CKD (chronic kidney disease) stage 3, GFR 30-59 ml/min (HCC)    Unilateral kidney   Dilated cardiomyopathy -related to A. fib RVR; resolved 10/2017   Thought to be related to A. fib RVR: a) echo 11/03/2017 showed LVEF of 35-40% with severe LVH --> follow-up echo January 2020 showed resolution with EF 55 to 60%.  Normal filling pressures.  Read as mild as opposed to severe LVH   Diverticulosis of colon    With polyps (Dr. Amedeo Plenty March 2017, followed by Dr. Glennon Hamilton)   Erectile dysfunction     Essential hypertension 2018   Gout    OSA on CPAP    [facility]   Status post nephrectomy 1965   Thrombocytopenia, unspecified ([place])    Platelet clumping (pseudothrombocytopenia)   Past Surgical History:  Procedure Laterality Date   APPENDECTOMY     During childhood   CARDIOVERSION N/A 12/14/2017   Procedure: CARDIOVERSION;  Surgeon: Sueanne Margarita, MD;  Location: [facility];  Service: Cardiovascular;  Laterality: N/A;   CARDIOVERSION N/A 07/04/2020   Procedure: CARDIOVERSION;  Surgeon: Sueanne Margarita, MD;  Location: [facility];  Service: Cardiovascular;  Laterality: N/A;   CYST EXCISION     face   INTRAVASCULAR PRESSURE WIRE/FFR STUDY N/A 01/31/2018   Procedure: INTRAVASCULAR PRESSURE WIRE/FFR STUDY;  Surgeon: Leonie Man, MD;  Location: [facility];  Service: Cardiovascular;  Laterality: N/A;   LEFT HEART CATH AND CORONARY ANGIOGRAPHY N/A 01/31/2018   Procedure: LEFT HEART CATH AND CORONARY ANGIOGRAPHY;  Surgeon: Leonie Man, MD;  Location: [facility];  Service: Cardiovascular: Single-vessel disease with roughly 60% lesion in major OM branch.  FFR negative.   NEPHRECTOMY Right 1965   TRANSTHORACIC ECHOCARDIOGRAM  10/2017    EF 35 -40% with severe LVH.  Diffuse global hypokinesis.  Mild aortic and mitral regurgitation.  Mild left and right atrial dilation.   TRANSTHORACIC ECHOCARDIOGRAM  02/2018   EF 55-60%, normal filling pressure. Mod LA dilation.  Mild to moderate TR. -> resolution of cardiomyopathy  VASECTOMY  1989     Current Outpatient Medications  Medication Sig Dispense Refill   acetaminophen (TYLENOL) 500 MG tablet Take 1,000 mg by mouth every 6 (six) hours as needed for moderate pain.     allopurinol (ZYLOPRIM) 300 MG tablet Take 150 mg by mouth daily.      amiodarone (PACERONE) 200 MG tablet Take 2 tablets (400 mg total) by mouth 2 (two) times daily for 7 days, THEN 1 tablet (200 mg total) 2 (two) times daily  for 7 days, THEN 1 tablet (200 mg total) daily. 102 tablet 0   atorvastatin (LIPITOR) 40 MG tablet Take 1 tablet (40 mg total) by mouth daily. 90 tablet 3   furosemide (LASIX) 20 MG tablet Take 20 mg tablet  by mouth daily as needed for increase swelling (Patient taking differently: No sig reported) 30 tablet 6   loratadine (CLARITIN) 10 MG tablet Take 10 mg by mouth daily as needed for allergies.     losartan (COZAAR) 50 MG tablet Take 1 tablet (50 mg total) by mouth daily. 90 tablet 3   metoprolol succinate (TOPROL-XL) 50 MG 24 hr tablet TAKE 1 TABLET BY MOUTH ONCE DAILY WITH MEALS 90 tablet 3   Multiple Vitamin (MULTIVITAMIN WITH MINERALS) TABS tablet Take 1 tablet by mouth daily.     sertraline (ZOLOFT) 100 MG tablet Take 50 mg by mouth daily.      tadalafil (CIALIS) 5 MG tablet Take 10-20 mg by mouth daily as needed for erectile dysfunction.     XARELTO 20 MG TABS tablet TAKE 1 TABLET BY MOUTH ONCE DAILY WITH SUPPER 90 tablet 1   No current facility-administered medications for this visit.    Allergies:   Patient has no known allergies.   Social History:  The patient  reports that he has never smoked. He has never used smokeless tobacco. He reports current alcohol use of about 1.0 standard drink of alcohol per week. He reports that he does not use drugs.   Family History:  The patient's family history includes Colon cancer in his mother; Dementia in his paternal grandmother; Heart attack in his paternal grandfather; Hypertension in his father; Seizures in his father; Stroke in his father.    ROS:  Please see the history of present illness.   Otherwise, review of systems is positive for none.   All other systems are reviewed and negative.    PHYSICAL EXAM: VS:  BP 128/70   Pulse (!) 48   Ht 5\' 10"  (1.778 m)   Wt 239 lb (108.4 kg)   SpO2 97%   BMI 34.29 kg/m  , BMI Body mass index is 34.29 kg/m. GEN: Well nourished, well developed, in no acute distress  HEENT: normal  Neck: no  JVD, carotid bruits, or masses Cardiac: RRR; no murmurs, rubs, or gallops,no edema  Respiratory:  clear to auscultation bilaterally, normal work of breathing GI: soft, nontender, nondistended, + BS MS: no deformity or atrophy  Skin: warm and dry Neuro:  Strength and sensation are intact Psych: euthymic mood, full affect  EKG:  EKG is ordered today. Personal review of the ekg ordered shows sinus rhythm, rate 48  Recent Labs: 06/27/2020: Platelets 129 07/04/2020: Hemoglobin 15.6 07/29/2020: BUN 31; Creatinine, Ser 2.00; Potassium 4.4; Sodium 139    Lipid Panel     Component Value Date/Time   CHOL 142 02/01/2018 0709   TRIG 105 02/01/2018 0709   HDL 34 (L) 02/01/2018 0709   CHOLHDL 4.2 02/01/2018 0709  VLDL 21 02/01/2018 0709   LDLCALC 87 02/01/2018 0709     Wt Readings from Last 3 Encounters:  08/08/20 239 lb (108.4 kg)  07/18/20 246 lb 6.4 oz (111.8 kg)  07/04/20 241 lb (109.3 kg)      Other studies Reviewed: Additional studies/ records that were reviewed today include: TTE 03/23/18  Review of the above records today demonstrates:   1. The left ventricle appears to be normal in size, have mild wall  thickness, with normal systolic function of 85-88%. Echo evidence of  normal in diastolic filling patterns.   2. Right ventricular systolic pressure is is mildly elevated.   3. The right ventricle is normal in size, has normal wall thickness and  normal systolic function.   4. Moderately dilated left atrial size.   5. Normal right atrial size.   6. Mitral valve regurgitation is mild by color flow Doppler.   7. The mitral valve normal in structure and function.   8. Normal tricuspid valve.   9. Tricuspid regurgitation mild-moderate.  10. Aortic valve normal.  11. Aortic valve regurgitation is mild by color flow Doppler.  12. The aortic root and ascending aortaare normal is size and structure.  13. The inferior vena cava was dilated in size with >50% respiratory   variablity.  14. No atrial level shunt detected by color flow Doppler.   LHC 01/31/18 Ost 2nd Mrg lesion is 55-60% stenosed. FFR 0.93 (not physiologically significant) Prox LAD lesion is 25% stenosed. LV end diastolic pressure is moderately elevated.  ASSESSMENT AND PLAN:  1.  Persistent atrial fibrillation: Currently on Eliquis and amiodarone.  High risk medication monitoring.  CHA2DS2-VASc of 4.  Due to his history of tachycardia mediated cardiomyopathy, ablation would be the most reasonable option.  We did discuss continuing to be on amiodarone, but he would prefer to be off of this medication due to side effects.  We Lavonne Kinderman plan for ablation.  Risk, benefits, and alternatives to EP study and radiofrequency ablation for afib were also discussed in detail today. These risks include but are not limited to stroke, bleeding, vascular damage, tamponade, perforation, damage to the esophagus, lungs, and other structures, pulmonary vein stenosis, worsening renal function, and death. The patient understands these risk and wishes to proceed.  We Merin Borjon therefore proceed with catheter ablation at the next available time.  Carto, ICE, anesthesia are requested for the procedure.  Juwon Scripter also obtain CT PV protocol prior to the procedure to exclude LAA thrombus and further evaluate atrial anatomy.  2.  Obesity: BMI of 35.  Diet and exercise encouraged.  3.  Obstructive sleep apnea: CPAP compliance encouraged  4.  Hypertension: Currently well controlled  5.  CKD: Patient has a solitary kidney.  His creatinine is worsening.  Rehan Holness refer to nephrology.  Case discussed with primary cardiology.   Current medicines are reviewed at length with the patient today.   The patient does not have concerns regarding his medicines.  The following changes were made today:  none  Labs/ tests ordered today include:  Orders Placed This Encounter  Procedures   CT CARDIAC MORPH/PULM VEIN W/CM&W/O CA SCORE   Basic metabolic  panel   CBC   Ambulatory referral to Nephrology   EKG 12-Lead      Disposition:   FU with Milady Fleener 3 months  Signed, Shalae Belmonte Meredith Leeds, MD  08/08/2020 12:02 PM     [facility] [address] [place] [address] [phone] (office) [phone] (  fax)

## 2020-08-12 ENCOUNTER — Other Ambulatory Visit: Payer: Self-pay

## 2020-08-12 DIAGNOSIS — I2510 Atherosclerotic heart disease of native coronary artery without angina pectoris: Secondary | ICD-10-CM

## 2020-08-12 DIAGNOSIS — Z79899 Other long term (current) drug therapy: Secondary | ICD-10-CM

## 2020-08-12 DIAGNOSIS — I10 Essential (primary) hypertension: Secondary | ICD-10-CM

## 2020-08-12 NOTE — Addendum Note (Signed)
Addended by: Stanton Kidney on: 08/12/2020 08:35 AM   Modules accepted: Orders

## 2020-08-29 ENCOUNTER — Other Ambulatory Visit: Payer: Self-pay

## 2020-08-29 ENCOUNTER — Telehealth: Payer: Self-pay

## 2020-08-29 MED ORDER — LOSARTAN POTASSIUM 50 MG PO TABS
50.0000 mg | ORAL_TABLET | Freq: Every day | ORAL | 3 refills | Status: DC
Start: 2020-08-29 — End: 2020-08-29

## 2020-08-29 MED ORDER — METOPROLOL SUCCINATE ER 50 MG PO TB24: ORAL_TABLET | ORAL | 3 refills | Status: AC

## 2020-08-29 MED ORDER — AMIODARONE HCL 200 MG PO TABS: ORAL_TABLET | ORAL | 0 refills | Status: DC

## 2020-08-29 MED ORDER — RIVAROXABAN 20 MG PO TABS: 20.0000 mg | ORAL_TABLET | Freq: Every day | ORAL | 1 refills | Status: DC

## 2020-08-29 MED ORDER — FUROSEMIDE 20 MG PO TABS: ORAL_TABLET | ORAL | 6 refills | Status: DC

## 2020-08-29 MED ORDER — LOSARTAN POTASSIUM 50 MG PO TABS: 50.0000 mg | ORAL_TABLET | Freq: Every day | ORAL | 3 refills | Status: AC

## 2020-08-29 MED ORDER — METOPROLOL SUCCINATE ER 50 MG PO TB24: ORAL_TABLET | ORAL | 3 refills | Status: DC

## 2020-08-29 MED ORDER — ATORVASTATIN CALCIUM 40 MG PO TABS: 40.0000 mg | ORAL_TABLET | Freq: Every day | ORAL | 3 refills | Status: DC

## 2020-08-29 MED ORDER — ATORVASTATIN CALCIUM 40 MG PO TABS: 40.0000 mg | ORAL_TABLET | Freq: Every day | ORAL | 3 refills | Status: AC

## 2020-08-29 NOTE — Telephone Encounter (Signed)
Prescription refill request for Xarelto received.  Indication: Atrial Fib Last office visit: 08/08/20  Camnitz MD Weight: 108.4kg Age: 75 Scr: 1.72 on 08/08/20 CrCl: 58.65  Based on above findings Xarelto 20mg  daily is the appropriate dose.  Refill approved.

## 2020-09-02 ENCOUNTER — Telehealth: Payer: Self-pay | Admitting: Cardiology

## 2020-09-02 NOTE — Telephone Encounter (Signed)
Paxlovid is contraindicated with his Xarelto. He also cannot stop his Xarelto since he is pending an afib ablation next month. Paxlovid is also contraindicated with his amiodarone, and interacts with his atorvastatin and Cialis. Would not recommend use of Paxlovid.

## 2020-09-02 NOTE — Telephone Encounter (Signed)
Spoke to patient pharmacist advised do not take Paxlovid.

## 2020-09-02 NOTE — Telephone Encounter (Signed)
Spoke to patient he stated he tested positive for covid this past Saturday.He wanted to check to see if any of his medications interfere with Paxlovid.Advised I will send message to our pharmacist for advice.

## 2020-09-02 NOTE — Telephone Encounter (Signed)
Pt c/o medication issue:  1. Name of Medication:  amiodarone (PACERONE) 200 MG tablet 2. How are you currently taking this medication (dosage and times per day)?  As prescribed  3. Are you having a reaction (difficulty breathing--STAT)?  No   4. What is your medication issue?   Patient states he recently tested positive for COVID and he is considering taking Paxlovid. However, he has concerns that it may have an interaction with Amiodarone. Please advise.

## 2020-09-16 ENCOUNTER — Other Ambulatory Visit: Payer: Medicare Other | Admitting: *Deleted

## 2020-09-16 ENCOUNTER — Other Ambulatory Visit: Payer: Self-pay

## 2020-09-16 DIAGNOSIS — I4819 Other persistent atrial fibrillation: Secondary | ICD-10-CM

## 2020-09-16 DIAGNOSIS — Z01812 Encounter for preprocedural laboratory examination: Secondary | ICD-10-CM | POA: Diagnosis not present

## 2020-09-17 LAB — CBC
Hematocrit: 41.1 % (ref 37.5–51.0)
Hemoglobin: 13.9 g/dL (ref 13.0–17.7)
MCH: 29.6 pg (ref 26.6–33.0)
MCHC: 33.8 g/dL (ref 31.5–35.7)
MCV: 87 fL (ref 79–97)
Platelets: 112 10*3/uL — ABNORMAL LOW (ref 150–450)
RBC: 4.7 x10E6/uL (ref 4.14–5.80)
RDW: 13.8 % (ref 11.6–15.4)
WBC: 7.9 10*3/uL (ref 3.4–10.8)

## 2020-09-17 LAB — BASIC METABOLIC PANEL
BUN/Creatinine Ratio: 14 (ref 10–24)
BUN: 25 mg/dL (ref 8–27)
CO2: 24 mmol/L (ref 20–29)
Calcium: 10.4 mg/dL — ABNORMAL HIGH (ref 8.6–10.2)
Chloride: 103 mmol/L (ref 96–106)
Creatinine, Ser: 1.84 mg/dL — ABNORMAL HIGH (ref 0.76–1.27)
Glucose: 122 mg/dL — ABNORMAL HIGH (ref 65–99)
Potassium: 4.6 mmol/L (ref 3.5–5.2)
Sodium: 140 mmol/L (ref 134–144)
eGFR: 38 mL/min/{1.73_m2} — ABNORMAL LOW (ref >59)

## 2020-09-18 DIAGNOSIS — N1832 Chronic kidney disease, stage 3b: Secondary | ICD-10-CM | POA: Diagnosis not present

## 2020-09-18 DIAGNOSIS — N390 Urinary tract infection, site not specified: Secondary | ICD-10-CM | POA: Diagnosis not present

## 2020-09-18 DIAGNOSIS — I509 Heart failure, unspecified: Secondary | ICD-10-CM | POA: Diagnosis not present

## 2020-09-18 DIAGNOSIS — I129 Hypertensive chronic kidney disease with stage 1 through stage 4 chronic kidney disease, or unspecified chronic kidney disease: Secondary | ICD-10-CM | POA: Diagnosis not present

## 2020-09-18 DIAGNOSIS — Z905 Acquired absence of kidney: Secondary | ICD-10-CM | POA: Diagnosis not present

## 2020-09-18 DIAGNOSIS — I4891 Unspecified atrial fibrillation: Secondary | ICD-10-CM | POA: Diagnosis not present

## 2020-09-18 DIAGNOSIS — N1831 Chronic kidney disease, stage 3a: Secondary | ICD-10-CM | POA: Diagnosis not present

## 2020-09-24 ENCOUNTER — Other Ambulatory Visit: Payer: Self-pay | Admitting: Nephrology

## 2020-09-24 DIAGNOSIS — I129 Hypertensive chronic kidney disease with stage 1 through stage 4 chronic kidney disease, or unspecified chronic kidney disease: Secondary | ICD-10-CM

## 2020-09-24 DIAGNOSIS — N1832 Chronic kidney disease, stage 3b: Secondary | ICD-10-CM

## 2020-09-25 ENCOUNTER — Telehealth: Payer: Self-pay | Admitting: Cardiology

## 2020-09-25 NOTE — Telephone Encounter (Signed)
Spoke with pt, aware of the recommendations.  

## 2020-09-25 NOTE — Telephone Encounter (Signed)
Tsh level 5.350 and calling to see if that would be an issue or not. Wants to know if he should decrease amiodarone (PACERONE) 200 MG tablet. Please advise

## 2020-09-25 NOTE — Telephone Encounter (Signed)
Spoke with Bruce Yoder, he was referred by dr Curt Bears to [facility] for evaluation prior to his ablation scheduled for 10/11/20. They checked the lab work at their office and wanted him to call and see if there needs to be changes made in his amiodarone dose. He is currently taking amiodarone 200 mg once daily. Patient is hopeful that he will be able to stop the amiodarone after the ablation. Aware will forward to dr Curt Bears and dr harding to see if adjustments need to be made. Bruce Yoder agreed with this plan.

## 2020-10-04 ENCOUNTER — Other Ambulatory Visit: Payer: Self-pay

## 2020-10-04 ENCOUNTER — Ambulatory Visit
Admission: RE | Admit: 2020-10-04 | Discharge: 2020-10-04 | Disposition: A | Discharge status: Home / Self Care | Payer: Medicare Other | Source: Ambulatory Visit | Attending: Nephrology | Admitting: Nephrology

## 2020-10-04 ENCOUNTER — Telehealth (HOSPITAL_COMMUNITY): Payer: Self-pay | Admitting: *Deleted

## 2020-10-04 DIAGNOSIS — I129 Hypertensive chronic kidney disease with stage 1 through stage 4 chronic kidney disease, or unspecified chronic kidney disease: Secondary | ICD-10-CM

## 2020-10-04 DIAGNOSIS — N1832 Chronic kidney disease, stage 3b: Secondary | ICD-10-CM | POA: Diagnosis not present

## 2020-10-04 NOTE — Telephone Encounter (Signed)
Reaching out to patient to offer assistance regarding upcoming cardiac imaging study; pt verbalizes understanding of appt date/time, parking situation and where to check in, pre-test NPO status, and verified current allergies; name and call back number provided for further questions should they arise  Gordy Clement RN [facility] [phone] office [phone] cell  PO hydration encouraged before and after scan.

## 2020-10-07 ENCOUNTER — Ambulatory Visit (HOSPITAL_COMMUNITY)
Admission: RE | Admit: 2020-10-07 | Discharge: 2020-10-07 | Disposition: A | Discharge status: Home / Self Care | Payer: Medicare Other | Source: Ambulatory Visit | Attending: Cardiology | Admitting: Cardiology

## 2020-10-07 ENCOUNTER — Inpatient Hospital Stay (HOSPITAL_COMMUNITY): Admission: RE | Admit: 2020-10-07 | Payer: Medicare Other | Source: Ambulatory Visit

## 2020-10-07 ENCOUNTER — Other Ambulatory Visit: Payer: Self-pay

## 2020-10-07 ENCOUNTER — Encounter (HOSPITAL_COMMUNITY): Payer: Self-pay

## 2020-10-07 DIAGNOSIS — I4819 Other persistent atrial fibrillation: Secondary | ICD-10-CM | POA: Insufficient documentation

## 2020-10-07 MED ORDER — IOHEXOL 350 MG/ML SOLN
80.0000 mL | Freq: Once | INTRAVENOUS | Status: AC | PRN
  Administered 2020-10-07: 80 mL via INTRAVENOUS

## 2020-10-10 NOTE — Pre-Procedure Instructions (Signed)
Instructed patient on the following items: Arrival time 0830 Nothing to eat or drink after midnight No meds AM of procedure Responsible person to drive you home and stay with you for 24 hrs  Have you missed any doses of anti-coagulant Xarelto-hasn't missed any doses   

## 2020-10-11 ENCOUNTER — Other Ambulatory Visit: Payer: Self-pay

## 2020-10-11 ENCOUNTER — Ambulatory Visit (HOSPITAL_COMMUNITY): Payer: Medicare Other | Admitting: Certified Registered Nurse Anesthetist

## 2020-10-11 ENCOUNTER — Encounter (HOSPITAL_COMMUNITY)
Admission: RE | Disposition: A | Discharge status: Home / Self Care | Payer: Self-pay | Source: Home / Self Care | Attending: Cardiology

## 2020-10-11 ENCOUNTER — Ambulatory Visit (HOSPITAL_COMMUNITY)
Admission: RE | Admit: 2020-10-11 | Discharge: 2020-10-11 | Disposition: A | Discharge status: Home / Self Care | Payer: Medicare Other | Attending: Cardiology | Admitting: Cardiology

## 2020-10-11 ENCOUNTER — Encounter (HOSPITAL_COMMUNITY): Payer: Self-pay | Admitting: Cardiology

## 2020-10-11 DIAGNOSIS — I129 Hypertensive chronic kidney disease with stage 1 through stage 4 chronic kidney disease, or unspecified chronic kidney disease: Secondary | ICD-10-CM | POA: Insufficient documentation

## 2020-10-11 DIAGNOSIS — G4733 Obstructive sleep apnea (adult) (pediatric): Secondary | ICD-10-CM | POA: Insufficient documentation

## 2020-10-11 DIAGNOSIS — Z8249 Family history of ischemic heart disease and other diseases of the circulatory system: Secondary | ICD-10-CM | POA: Diagnosis not present

## 2020-10-11 DIAGNOSIS — Z9989 Dependence on other enabling machines and devices: Secondary | ICD-10-CM | POA: Diagnosis not present

## 2020-10-11 DIAGNOSIS — N189 Chronic kidney disease, unspecified: Secondary | ICD-10-CM | POA: Insufficient documentation

## 2020-10-11 DIAGNOSIS — I2510 Atherosclerotic heart disease of native coronary artery without angina pectoris: Secondary | ICD-10-CM | POA: Insufficient documentation

## 2020-10-11 DIAGNOSIS — Z7901 Long term (current) use of anticoagulants: Secondary | ICD-10-CM | POA: Diagnosis not present

## 2020-10-11 DIAGNOSIS — I4819 Other persistent atrial fibrillation: Secondary | ICD-10-CM | POA: Insufficient documentation

## 2020-10-11 DIAGNOSIS — I480 Paroxysmal atrial fibrillation: Secondary | ICD-10-CM | POA: Diagnosis not present

## 2020-10-11 HISTORY — PX: ATRIAL FIBRILLATION ABLATION: EP1191

## 2020-10-11 LAB — POCT ACTIVATED CLOTTING TIME
Activated Clotting Time: 341 seconds
Activated Clotting Time: 347 seconds
Activated Clotting Time: 393 seconds

## 2020-10-11 SURGERY — ATRIAL FIBRILLATION ABLATION
Anesthesia: General

## 2020-10-11 MED ORDER — HEPARIN SODIUM (PORCINE) 1000 UNIT/ML IJ SOLN
INTRAMUSCULAR | Status: AC
  Filled 2020-10-11: qty 1

## 2020-10-11 MED ORDER — HEPARIN (PORCINE) IN NACL 1000-0.9 UT/500ML-% IV SOLN
INTRAVENOUS | Status: DC | PRN
  Administered 2020-10-11 (×5): 500 mL

## 2020-10-11 MED ORDER — SUGAMMADEX SODIUM 200 MG/2ML IV SOLN
INTRAVENOUS | Status: DC | PRN
  Administered 2020-10-11: 200 mg via INTRAVENOUS

## 2020-10-11 MED ORDER — PROPOFOL 10 MG/ML IV BOLUS
INTRAVENOUS | Status: DC | PRN
  Administered 2020-10-11: 120 mg via INTRAVENOUS

## 2020-10-11 MED ORDER — DOBUTAMINE INFUSION FOR EP/ECHO/NUC (1000 MCG/ML)
INTRAVENOUS | Status: AC
  Filled 2020-10-11: qty 250

## 2020-10-11 MED ORDER — EPHEDRINE SULFATE-NACL 50-0.9 MG/10ML-% IV SOSY
PREFILLED_SYRINGE | INTRAVENOUS | Status: DC | PRN
  Administered 2020-10-11 (×4): 10 mg via INTRAVENOUS

## 2020-10-11 MED ORDER — HEPARIN SODIUM (PORCINE) 1000 UNIT/ML IJ SOLN
INTRAMUSCULAR | Status: DC | PRN
  Administered 2020-10-11: 1000 [IU] via INTRAVENOUS

## 2020-10-11 MED ORDER — DEXAMETHASONE SODIUM PHOSPHATE 10 MG/ML IJ SOLN
INTRAMUSCULAR | Status: DC | PRN
  Administered 2020-10-11: 5 mg via INTRAVENOUS

## 2020-10-11 MED ORDER — LIDOCAINE 2% (20 MG/ML) 5 ML SYRINGE
INTRAMUSCULAR | Status: DC | PRN
  Administered 2020-10-11: 60 mg via INTRAVENOUS

## 2020-10-11 MED ORDER — FENTANYL CITRATE (PF) 100 MCG/2ML IJ SOLN
INTRAMUSCULAR | Status: DC | PRN
  Administered 2020-10-11: 100 ug via INTRAVENOUS

## 2020-10-11 MED ORDER — PROTAMINE SULFATE 10 MG/ML IV SOLN
INTRAVENOUS | Status: DC | PRN
  Administered 2020-10-11: 40 mg via INTRAVENOUS

## 2020-10-11 MED ORDER — ONDANSETRON HCL 4 MG/2ML IJ SOLN
INTRAMUSCULAR | Status: DC | PRN
  Administered 2020-10-11: 4 mg via INTRAVENOUS

## 2020-10-11 MED ORDER — ROCURONIUM BROMIDE 10 MG/ML (PF) SYRINGE
PREFILLED_SYRINGE | INTRAVENOUS | Status: DC | PRN
  Administered 2020-10-11: 50 mg via INTRAVENOUS

## 2020-10-11 MED ORDER — DOBUTAMINE IN D5W 4-5 MG/ML-% IV SOLN
INTRAVENOUS | Status: DC | PRN
  Administered 2020-10-11: 20 ug/kg/min via INTRAVENOUS

## 2020-10-11 MED ORDER — SODIUM CHLORIDE 0.9 % IV SOLN: INTRAVENOUS | Status: DC

## 2020-10-11 MED ORDER — HEPARIN SODIUM (PORCINE) 1000 UNIT/ML IJ SOLN
INTRAMUSCULAR | Status: DC | PRN
  Administered 2020-10-11: 1000 [IU] via INTRAVENOUS
  Administered 2020-10-11: 15000 [IU] via INTRAVENOUS

## 2020-10-11 SURGICAL SUPPLY — 20 items
BAG SNAP BAND KOVER 36X36 (MISCELLANEOUS) ×2 IMPLANT
BLANKET WARM UNDERBOD FULL ACC (MISCELLANEOUS) ×2 IMPLANT
CATH 8FR REPROCESSED SOUNDSTAR (CATHETERS) ×2 IMPLANT
CATH OCTARAY 2.0 F 3-3-3-3-3 (CATHETERS) ×2 IMPLANT
CATH S CIRCA THERM PROBE 10F (CATHETERS) ×2 IMPLANT
CATH SMTCH THERMOCOOL SF DF (CATHETERS) ×2 IMPLANT
CATH WEB BI DIR CSDF CRV REPRO (CATHETERS) ×2 IMPLANT
CLOSURE PERCLOSE PROSTYLE (VASCULAR PRODUCTS) ×8 IMPLANT
COVER SWIFTLINK CONNECTOR (BAG) ×2 IMPLANT
KIT VERSACROSS STEERABLE D1 (CATHETERS) ×2 IMPLANT
MAT PREVALON FULL STRYKER (MISCELLANEOUS) ×2 IMPLANT
PACK EP LATEX FREE (CUSTOM PROCEDURE TRAY) ×2
PACK EP LF (CUSTOM PROCEDURE TRAY) ×1 IMPLANT
PATCH CARTO3 (PAD) ×2 IMPLANT
SHEATH CARTO VIZIGO SM CVD (SHEATH) ×2 IMPLANT
SHEATH PINNACLE 7F 10CM (SHEATH) ×2 IMPLANT
SHEATH PINNACLE 8F 10CM (SHEATH) ×4 IMPLANT
SHEATH PINNACLE 9F 10CM (SHEATH) ×2 IMPLANT
SHEATH PROBE COVER 6X72 (BAG) ×2 IMPLANT
TUBING SMART ABLATE COOLFLOW (TUBING) ×4 IMPLANT

## 2020-10-11 NOTE — Discharge Instructions (Addendum)
Cardiac Ablation, Care After  This sheet gives you information about how to care for yourself after your procedure. Your health care provider may also give you more specific instructions. If you have problems or questions, contact your health care provider. What can I expect after the procedure? After the procedure, it is common to have: Bruising around your puncture site. Tenderness around your puncture site. Skipped heartbeats. Tiredness (fatigue).  Follow these instructions at home: Puncture site care  Follow instructions from your health care provider about how to take care of your puncture site. Make sure you: If present, leave stitches (sutures), skin glue, or adhesive strips in place. These skin closures may need to stay in place for up to 2 weeks. If adhesive strip edges start to loosen and curl up, you may trim the loose edges. Do not remove adhesive strips completely unless your health care provider tells you to do that. If a large square bandage is present, this may be removed 24 hours after surgery.  Check your puncture site every day for signs of infection. Check for: Redness, swelling, or pain. Fluid or blood. If your puncture site starts to bleed, lie down on your back, apply firm pressure to the area, and contact your health care provider. Warmth. Pus or a bad smell. Driving Do not drive for at least 4 days after your procedure or however long your health care provider recommends. (Do not resume driving if you have previously been instructed not to drive for other health reasons.) Do not drive or use heavy machinery while taking prescription pain medicine. Activity Avoid activities that take a lot of effort for at least 7 days after your procedure. Do not lift anything that is heavier than 5 lb (4.5 kg) for one week.  No sexual activity for 1 week.  Return to your normal activities as told by your health care provider. Ask your health care provider what activities are  safe for you. General instructions Take over-the-counter and prescription medicines only as told by your health care provider. Do not use any products that contain nicotine or tobacco, such as cigarettes and e-cigarettes. If you need help quitting, ask your health care provider. You may shower after 24 hours, but Do not take baths, swim, or use a hot tub for 1 week.  Do not drink alcohol for 24 hours after your procedure. Keep all follow-up visits as told by your health care provider. This is important. Contact a health care provider if: You have redness, mild swelling, or pain around your puncture site. You have fluid or blood coming from your puncture site that stops after applying firm pressure to the area. Your puncture site feels warm to the touch. You have pus or a bad smell coming from your puncture site. You have a fever. You have chest pain or discomfort that spreads to your neck, jaw, or arm. You are sweating a lot. You feel nauseous. You have a fast or irregular heartbeat. You have shortness of breath. You are dizzy or light-headed and feel the need to lie down. You have pain or numbness in the arm or leg closest to your puncture site. Get help right away if: Your puncture site suddenly swells. Your puncture site is bleeding and the bleeding does not stop after applying firm pressure to the area. These symptoms may represent a serious problem that is an emergency. Do not wait to see if the symptoms will go away. Get medical help right away. Call your  local emergency services (911 in the [place] Do not drive yourself to the hospital. Summary After the procedure, it is normal to have bruising and tenderness at the puncture site in your groin, neck, or forearm. Check your puncture site every day for signs of infection. Get help right away if your puncture site is bleeding and the bleeding does not stop after applying firm pressure to the area. This is a medical emergency. This  information is not intended to replace advice given to you by your health care provider. Make sure you discuss any questions you have with your health care provider.     Post procedure care instructions No driving for 4 days. No lifting over 5 lbs for 1 week. No vigorous or sexual activity for 1 week. You may return to work/your usual activities on 10/19/20. Keep procedure site clean & dry. If you notice increased pain, swelling, bleeding or pus, call/return!  You may shower after 24 hours, but no soaking in baths/hot tubs/pools for 1 week.    You have an appointment set up with the [facility].  Multiple studies have shown that being followed by a dedicated atrial fibrillation clinic in addition to the standard care you receive from your other physicians improves health. We believe that enrollment in the atrial fibrillation clinic will allow [place] to better care for you.   The phone number to the [facility] is [phone]. The clinic is staffed Monday through Friday from 8:30am to 5pm.  Parking Directions: The clinic is located in the Heart and Vascular Building connected to [facility]. 1)From [address] turn on to [address] and go to the 3rd entrance  (Heart and Vascular entrance) on the right. 2)Look to the right for Heart &Vascular Parking Garage. 3)A code for the entrance is required, for Sept is 4455.   4)Take the elevators to the 1st floor. Registration is in the room with the glass walls at the end of the hallway.  If you have any trouble parking or locating the clinic, please don't hesitate to call [phone].

## 2020-10-11 NOTE — Transfer of Care (Signed)
Immediate Anesthesia Transfer of Care Note  Patient: Bruce Yoder  Procedure(s) Performed: ATRIAL FIBRILLATION ABLATION  Patient Location: PACU  Anesthesia Type:General  Level of Consciousness: awake and alert   Airway & Oxygen Therapy: Patient Spontanous Breathing and Patient connected to nasal cannula oxygen  Post-op Assessment: Report given to RN and Post -op Vital signs reviewed and stable  Post vital signs: Reviewed and stable  Last Vitals:  Vitals Value Taken Time  BP 146/79 10/11/20 1314  Temp    Pulse 62 10/11/20 1316  Resp 24 10/11/20 1316  SpO2 96 % 10/11/20 1316  Vitals shown include unvalidated device data.  Last Pain:  Vitals:   10/11/20 0908  TempSrc: Oral         Complications: There were no known notable events for this encounter.

## 2020-10-11 NOTE — Anesthesia Postprocedure Evaluation (Signed)
Anesthesia Post Note  Patient: Bruce Yoder  Procedure(s) Performed: ATRIAL FIBRILLATION ABLATION     Patient location during evaluation: PACU Anesthesia Type: General Level of consciousness: awake and alert Pain management: pain level controlled Vital Signs Assessment: post-procedure vital signs reviewed and stable Respiratory status: spontaneous breathing, nonlabored ventilation and respiratory function stable Cardiovascular status: stable and blood pressure returned to baseline Anesthetic complications: no   There were no known notable events for this encounter.  Last Vitals:  Vitals:   10/11/20 1345 10/11/20 1358  BP: 137/70 133/69  Pulse: (!) 58 (!) 57  Resp: 17 12  Temp: 36.5 C   SpO2: 96% 92%    Last Pain:  Vitals:   10/11/20 1345  TempSrc: Temporal  PainSc:                  [name]

## 2020-10-11 NOTE — Anesthesia Procedure Notes (Signed)
Procedure Name: Intubation Date/Time: 10/11/2020 10:52 AM Performed by: Reece Agar, CRNA Pre-anesthesia Checklist: Patient identified, Emergency Drugs available, Suction available and Patient being monitored Patient Re-evaluated:Patient Re-evaluated prior to induction Oxygen Delivery Method: Circle System Utilized Preoxygenation: Pre-oxygenation with 100% oxygen Induction Type: IV induction Ventilation: Mask ventilation without difficulty and Oral airway inserted - appropriate to patient size Laryngoscope Size: Glidescope and 4 Grade View: Grade I Tube type: Oral Tube size: 7.5 mm Number of attempts: 1 Airway Equipment and Method: Stylet and Oral airway Placement Confirmation: ETT inserted through vocal cords under direct vision, positive ETCO2 and breath sounds checked- equal and bilateral Secured at: 23 cm Tube secured with: Tape Dental Injury: Teeth and Oropharynx as per pre-operative assessment and Injury to lip  Difficulty Due To: Difficulty was unanticipated, Difficult Airway- due to anterior larynx and Difficult Airway- due to limited oral opening Comments: Initial intubation with MAC 4 blade with grade 3 view. Good TV, EtCO2, +BS, chest rise but heard a significant air leak that wasn't resolved. Deflated cuff and Inserted Glidescope L4 blade and saw that ETT cuff was above the cords. Advanced the ETT through cords with visualization from Glidescope. Small lip laceration was noticed. Lip cleansed, no longer bleeding and ointment applied.

## 2020-10-11 NOTE — Progress Notes (Signed)
Client up and walked and tolerated well; bilat groins stable, no bleeding or hematoma

## 2020-10-11 NOTE — H&P (Signed)
Electrophysiology Office Note   Date:  10/11/2020   ID:  Bruce Yoder, DOB 08/21/1945, MRN [id number]  PCP:  Lajean Manes, MD  Cardiologist:  Ellyn Hack Primary Electrophysiologist:  Abrahan Fulmore Meredith Leeds, MD    Chief Complaint: AF   History of Present Illness: Bruce Yoder is a 75 y.o. male who is being seen today for the evaluation of AF at the request of No ref. provider found. Presenting today for electrophysiology evaluation.  He has a history significant for coronary artery disease, CKD with a unilateral kidney, hypertension, OSA, and atrial fibrillation.  He is currently on Xarelto.  He was found to have persistent atrial fibrillation in 2019 with a reduced ejection fraction.  He underwent cardioversion.  He did well until he was found to be in atrial fibrillation by his PCP May 2022.  He was started on amiodarone and had a cardioversion 07/04/2020. Today, denies symptoms of palpitations, chest pain, shortness of breath, orthopnea, PND, lower extremity edema, claudication, dizziness, presyncope, syncope, bleeding, or neurologic sequela. The patient is tolerating medications without difficulties. Ablation today.    Past Medical History:  Diagnosis Date   Atrial fibrillation ([place])    a. diagnosed on 10/22/2017; b. successful DCCV on 12/14/2017; Paroxysmal Persistent   Basal cell carcinoma (BCC) of back    Dr. Elvera Lennox   CAD (coronary artery disease)    a. LHC 01/31/2018: OM2 55-60%, FFR 0.93. pLAD 25%   CKD (chronic kidney disease) stage 3, GFR 30-59 ml/min (HCC)    Unilateral kidney   Dilated cardiomyopathy -related to A. fib RVR; resolved 10/2017   Thought to be related to A. fib RVR: a) echo 11/03/2017 showed LVEF of 35-40% with severe LVH --> follow-up echo January 2020 showed resolution with EF 55 to 60%.  Normal filling pressures.  Read as mild as opposed to severe LVH   Diverticulosis of colon    With polyps (Dr. Amedeo Plenty March 2017, followed by Dr. Glennon Hamilton)   Erectile  dysfunction    Essential hypertension 2018   Gout    OSA on CPAP    [facility]   Status post nephrectomy 1965   Thrombocytopenia, unspecified ([place])    Platelet clumping (pseudothrombocytopenia)   Past Surgical History:  Procedure Laterality Date   APPENDECTOMY     During childhood   CARDIOVERSION N/A 12/14/2017   Procedure: CARDIOVERSION;  Surgeon: Sueanne Margarita, MD;  Location: [facility];  Service: Cardiovascular;  Laterality: N/A;   CARDIOVERSION N/A 07/04/2020   Procedure: CARDIOVERSION;  Surgeon: Sueanne Margarita, MD;  Location: [facility];  Service: Cardiovascular;  Laterality: N/A;   CYST EXCISION     face   INTRAVASCULAR PRESSURE WIRE/FFR STUDY N/A 01/31/2018   Procedure: INTRAVASCULAR PRESSURE WIRE/FFR STUDY;  Surgeon: Leonie Man, MD;  Location: [facility];  Service: Cardiovascular;  Laterality: N/A;   LEFT HEART CATH AND CORONARY ANGIOGRAPHY N/A 01/31/2018   Procedure: LEFT HEART CATH AND CORONARY ANGIOGRAPHY;  Surgeon: Leonie Man, MD;  Location: [facility];  Service: Cardiovascular: Single-vessel disease with roughly 60% lesion in major OM branch.  FFR negative.   NEPHRECTOMY Right 1965   TRANSTHORACIC ECHOCARDIOGRAM  10/2017    EF 35 -40% with severe LVH.  Diffuse global hypokinesis.  Mild aortic and mitral regurgitation.  Mild left and right atrial dilation.   TRANSTHORACIC ECHOCARDIOGRAM  02/2018   EF 55-60%, normal filling pressure. Mod LA dilation.  Mild to moderate TR. -> resolution of cardiomyopathy  VASECTOMY  1989     Current Facility-Administered Medications  Medication Dose Route Frequency Provider Last Rate Last Admin   0.9 %  sodium chloride infusion   Intravenous Continuous Constance Haw, MD 50 mL/hr at 10/11/20 0943 New Bag at 10/11/20 0943    Allergies:   Patient has no known allergies.   Social History:  The patient  reports that he has never smoked. He has never used smokeless  tobacco. He reports current alcohol use of about 1.0 standard drink per week. He reports that he does not use drugs.   Family History:  The patient's family history includes Colon cancer in his mother; Dementia in his paternal grandmother; Heart attack in his paternal grandfather; Hypertension in his father; Seizures in his father; Stroke in his father.   ROS:  Please see the history of present illness.   Otherwise, review of systems is positive for none.   All other systems are reviewed and negative.   PHYSICAL EXAM: VS:  BP (!) 149/73   Pulse (!) 49   Temp 98.2 F (36.8 C) (Oral)   Resp 18   Ht '5\' 10"'$  (1.778 m)   Wt 107 kg   SpO2 99%   BMI 33.86 kg/m  , BMI Body mass index is 33.86 kg/m. GEN: Well nourished, well developed, in no acute distress  HEENT: normal  Neck: no JVD, carotid bruits, or masses Cardiac: RRR; no murmurs, rubs, or gallops,no edema  Respiratory:  clear to auscultation bilaterally, normal work of breathing GI: soft, nontender, nondistended, + BS MS: no deformity or atrophy  Skin: warm and dry Neuro:  Strength and sensation are intact Psych: euthymic mood, full affect  Recent Labs: 09/16/2020: BUN 25; Creatinine, Ser 1.84; Hemoglobin 13.9; Platelets 112; Potassium 4.6; Sodium 140    Lipid Panel     Component Value Date/Time   CHOL 142 02/01/2018 0709   TRIG 105 02/01/2018 0709   HDL 34 (L) 02/01/2018 0709   CHOLHDL 4.2 02/01/2018 0709   VLDL 21 02/01/2018 0709   LDLCALC 87 02/01/2018 0709     Wt Readings from Last 3 Encounters:  10/11/20 107 kg  08/08/20 108.4 kg  07/18/20 111.8 kg      Other studies Reviewed: Additional studies/ records that were reviewed today include: TTE 03/23/18  Review of the above records today demonstrates:   1. The left ventricle appears to be normal in size, have mild wall  thickness, with normal systolic function of 0000000. Echo evidence of  normal in diastolic filling patterns.   2. Right ventricular systolic  pressure is is mildly elevated.   3. The right ventricle is normal in size, has normal wall thickness and  normal systolic function.   4. Moderately dilated left atrial size.   5. Normal right atrial size.   6. Mitral valve regurgitation is mild by color flow Doppler.   7. The mitral valve normal in structure and function.   8. Normal tricuspid valve.   9. Tricuspid regurgitation mild-moderate.  10. Aortic valve normal.  11. Aortic valve regurgitation is mild by color flow Doppler.  12. The aortic root and ascending aortaare normal is size and structure.  13. The inferior vena cava was dilated in size with >50% respiratory  variablity.  14. No atrial level shunt detected by color flow Doppler.   LHC 01/31/18 Ost 2nd Mrg lesion is 55-60% stenosed. FFR 0.93 (not physiologically significant) Prox LAD lesion is 25% stenosed. LV end diastolic pressure is moderately elevated.  ASSESSMENT AND PLAN:  1.  Persistent atrial fibrillation: Bruce Yoder has presented today for surgery, with the diagnosis of atrial fibrilation.  The various methods of treatment have been discussed with the patient and family. After consideration of risks, benefits and other options for treatment, the patient has consented to  Procedure(s): Catheter ablation as a surgical intervention .  Risks include but not limited to complete heart [place], stroke, esophageal damage, nerve damage, bleeding, vascular damage, tamponade, perforation, MI, and death. The patient's history has been reviewed, patient examined, no change in status, stable for surgery.  I have reviewed the patient's chart and labs.  Questions were answered to the patient's satisfaction.    Monifa Blanchette Curt Bears, MD 10/11/2020 10:02 AM

## 2020-10-11 NOTE — Anesthesia Preprocedure Evaluation (Addendum)
Anesthesia Evaluation  Patient identified by MRN, date of birth, ID band Patient awake    Reviewed: Allergy & Precautions, NPO status , Patient's Chart, lab work & pertinent test results  History of Anesthesia Complications Negative for: history of anesthetic complications  Airway Mallampati: III  TM Distance: >3 FB Neck ROM: Full    Dental  (+) Dental Advisory Given, Teeth Intact   Pulmonary sleep apnea and Continuous Positive Airway Pressure Ventilation ,    Pulmonary exam normal        Cardiovascular hypertension, Pt. on medications and Pt. on home beta blockers (-) angina+ CAD and +CHF  Normal cardiovascular exam+ dysrhythmias Atrial Fibrillation    '20 TTE - EF 55-60%. Right ventricular systolic pressure is is mildly elevated. Moderately dilated left atrial size. Mild MR. Mild-moderate TR. Mild AI.    Neuro/Psych negative neurological ROS  negative psych ROS   GI/Hepatic negative GI ROS, Neg liver ROS,   Endo/Other   Obesity   Renal/GU CRFRenal disease S/p nephrectomy      Musculoskeletal negative musculoskeletal ROS (+)   Abdominal   Peds  Hematology  On xarelto    Anesthesia Other Findings   Reproductive/Obstetrics                            Anesthesia Physical Anesthesia Plan  ASA: 3  Anesthesia Plan: General   Post-op Pain Management:    Induction: Intravenous  PONV Risk Score and Plan: 2 and Treatment may vary due to age or medical condition, Ondansetron and Dexamethasone  Airway Management Planned: Oral ETT  Additional Equipment: None  Intra-op Plan:   Post-operative Plan: Extubation in OR  Informed Consent: I have reviewed the patients History and Physical, chart, labs and discussed the procedure including the risks, benefits and alternatives for the proposed anesthesia with the patient or authorized representative who has indicated his/her understanding and  acceptance.     Dental advisory given  Plan Discussed with: CRNA and Anesthesiologist  Anesthesia Plan Comments:        Anesthesia Quick Evaluation

## 2020-10-13 ENCOUNTER — Encounter (HOSPITAL_COMMUNITY): Payer: Self-pay | Admitting: Cardiology

## 2020-10-21 ENCOUNTER — Encounter: Payer: Self-pay | Admitting: Dietician

## 2020-10-21 ENCOUNTER — Other Ambulatory Visit: Payer: Self-pay

## 2020-10-21 ENCOUNTER — Encounter: Payer: Medicare Other | Attending: Nephrology | Admitting: Dietician

## 2020-10-21 DIAGNOSIS — N1832 Chronic kidney disease, stage 3b: Secondary | ICD-10-CM | POA: Diagnosis not present

## 2020-10-21 NOTE — Patient Instructions (Addendum)
Resources: [employer] [facility] The Kidney Friendly Diet Cookbook by Jennette Banker, RD  Continue a low sodium diet Continue a low protein diet No need for a potassium restriction at this time unless your lab work shows a high potassium. Avoid foods with Phos... in the ingredient list  Ask your doctor about your phosphorous level.  Consider having your doctor check your A1C.  Continue to be active most day

## 2020-10-21 NOTE — Progress Notes (Signed)
Medical Nutrition Therapy  Appointment Start time:  33 (late)  Appointment End time:  0925 Patient is here today alone.    Primary concerns today: He would like further information on nutrition for CKD.  He verbalizes that he needs to lose weight (20-30 lbs.).  He states that his blood glucose has been slightly elevated recently and would also like this to improve. Referral diagnosis: CKD, obesity Preferred learning style: no preference indicated Learning readiness: ready, change in progress   NUTRITION ASSESSMENT   Anthropometrics  73"  Weight 233 lbs at home 10/21/2020 Lost about 10 lbs recently.  Clinical Medical Hx: CKD3b, obesity, solitary kidney (1 kidney removed at age 75), CAD, HTN, dilated cardiomyopathy Medications: see list Labs: 09/16/2020 eGFR 38, BUN 25, Creatinine 1.84, Potassium 4.6, calcium 10.4 Notable Signs/Symptoms: occasional swelling of feat and angles  Lifestyle & Dietary Hx Low sodium diet Salt free [place] herb blend, sesame oil, olive oil, oil and vinegar dressing rather than salad dressing. Used to eat a lot of nuts and has reduced these.  Patient lives with his wife who is a college professor and helped her father with CKD.  They share shopping and cooking.  She ordered several recent books on the renal diet. He is a retired Producer, television/film/video and enjoys playing [employer].  Estimated daily fluid intake:  oz Supplements: renal MVI Sleep: OSA on c-pap, up to urinate twice nightly.  Sleeps an average of 8 hours per night Stress / self-care: good Current average weekly physical activity: [employer] and [employer] bike, upper body workouts, walking, golfing (walks course):  30-60 minutes most day  24-Hr Dietary Recall First Meal: usually none Snack: none Second Meal: chicken, cabbage, berries Snack: fruit occasionally Third Meal: steak (6 oz), broccoli (out to eat) Snack: PB and honey OR apple Beverages: water, black tea with lemon and splenda,  coffee with coffee and creamer, herbal tea, occasional wine or mixed drink but not daily (average 1-2 drinks daily reduced to 2 per week)  Estimated Energy Needs Calories: 1800 Protein: 65g   NUTRITION DIAGNOSIS  NB-1.1 Food and nutrition-related knowledge deficit As related to renal diet.  As evidenced by diet hx and patient report.   NUTRITION INTERVENTION  Nutrition education (E-1) on the following topics:  Resources for kidney disease (without dialysis) Protein recommendations, sources, rational Potassium sources and to avoid restricting until necessary Sodium, label reading, shopping Eating out tips (Calorie [employer] app) Phosphorous, sources, additive Tips to improve blood glucose metabolism (continue to stay active, lose weight, low fat/low saturated fat) Discussed meal plan given appropriate for weight loss Starches and blood sugar/weight loss  Handouts Provided Include  CKD stages 3-5 Nutrition therapy for those not on dialysis from AND Label reading NKD national kidney diet Dish up a kidney-friendly meal for patients with CKD (not on Dialysis)  Learning Style & Readiness for Change Teaching method utilized: Visual & Auditory  Demonstrated degree of understanding via: Teach Back  Barriers to learning/adherence to lifestyle change: none  Goals Established by Pt Resources: [employer] [facility] The Kidney Friendly Diet Cookbook by Jennette Banker, RD  Continue a low sodium diet Continue a low protein diet No need for a potassium restriction at this time unless your lab work shows a high potassium. Avoid foods with Phos... in the ingredient list  Ask your doctor about your phosphorous level.  Consider having your doctor check your A1C.  Continue to be active most day   MONITORING & EVALUATION Dietary intake, weekly physical activity, and  label reading prn.  Next Steps  Patient is to call for questions.

## 2020-11-11 ENCOUNTER — Ambulatory Visit (HOSPITAL_COMMUNITY)
Admission: RE | Admit: 2020-11-11 | Discharge: 2020-11-11 | Disposition: A | Discharge status: Home / Self Care | Payer: Medicare Other | Source: Ambulatory Visit | Attending: Physician Assistant | Admitting: Physician Assistant

## 2020-11-11 ENCOUNTER — Encounter (HOSPITAL_COMMUNITY): Payer: Self-pay | Admitting: Physician Assistant

## 2020-11-11 ENCOUNTER — Other Ambulatory Visit: Payer: Self-pay

## 2020-11-11 VITALS — BP 140/80 | HR 55 | Ht 71.0 in | Wt 235.8 lb

## 2020-11-11 DIAGNOSIS — Q600 Renal agenesis, unilateral: Secondary | ICD-10-CM | POA: Diagnosis not present

## 2020-11-11 DIAGNOSIS — Z6832 Body mass index (BMI) 32.0-32.9, adult: Secondary | ICD-10-CM | POA: Insufficient documentation

## 2020-11-11 DIAGNOSIS — Z79899 Other long term (current) drug therapy: Secondary | ICD-10-CM | POA: Insufficient documentation

## 2020-11-11 DIAGNOSIS — I2510 Atherosclerotic heart disease of native coronary artery without angina pectoris: Secondary | ICD-10-CM | POA: Insufficient documentation

## 2020-11-11 DIAGNOSIS — G4733 Obstructive sleep apnea (adult) (pediatric): Secondary | ICD-10-CM | POA: Insufficient documentation

## 2020-11-11 DIAGNOSIS — E669 Obesity, unspecified: Secondary | ICD-10-CM | POA: Insufficient documentation

## 2020-11-11 DIAGNOSIS — Z7901 Long term (current) use of anticoagulants: Secondary | ICD-10-CM | POA: Insufficient documentation

## 2020-11-11 DIAGNOSIS — I129 Hypertensive chronic kidney disease with stage 1 through stage 4 chronic kidney disease, or unspecified chronic kidney disease: Secondary | ICD-10-CM | POA: Diagnosis not present

## 2020-11-11 DIAGNOSIS — I4819 Other persistent atrial fibrillation: Secondary | ICD-10-CM | POA: Insufficient documentation

## 2020-11-11 DIAGNOSIS — D6869 Other thrombophilia: Secondary | ICD-10-CM | POA: Diagnosis not present

## 2020-11-11 DIAGNOSIS — N183 Chronic kidney disease, stage 3 unspecified: Secondary | ICD-10-CM | POA: Insufficient documentation

## 2020-11-11 DIAGNOSIS — Z8249 Family history of ischemic heart disease and other diseases of the circulatory system: Secondary | ICD-10-CM | POA: Diagnosis not present

## 2020-11-11 DIAGNOSIS — I429 Cardiomyopathy, unspecified: Secondary | ICD-10-CM | POA: Insufficient documentation

## 2020-11-11 NOTE — Progress Notes (Signed)
Primary Care Physician: Lajean Manes, MD Primary Cardiologist: Dr Ellyn Hack Primary Electrophysiologist: Dr Curt Bears  Referring Physician: Dr Campbell Riches is a 75 y.o. male with a history of CAD, CKD (unilateral kidney), cardiomyopathy, HTN, OSA, and atrial fibrillation who presents for follow up in the [facility]. Patient is on Xarelto for a CHADS2VASC score of 4. He was found to be in persistent afib in 2019 with reduced EF 35-40% and underwent DCCV at that time. He had done well until follow up 06/2020 when he was found to be back in afib at a PCP visit. He was started on amiodarone and underwent DCCV on 07/04/20.   On follow up today, patient is s/p afib ablation with Dr Curt Bears on 10/11/20. He reports that he has done well since the procedure. He has not had any heart racing or palpitations. He denies CP, swallowing pain, or groin issues. He is now off amiodarone.   Today, he denies symptoms of palpitations, chest pain, shortness of breath, orthopnea, PND, lower extremity edema, dizziness, presyncope, syncope, bleeding, or neurologic sequela. The patient is tolerating medications without difficulties and is otherwise without complaint today.    Atrial Fibrillation Risk Factors:  he does have symptoms or diagnosis of sleep apnea. he is compliant with CPAP therapy. he does not have a history of rheumatic fever. he does have a history of alcohol use.   he has a BMI of Body mass index is 32.89 kg/m.Marland Kitchen Filed Weights   11/11/20 1337  Weight: 107 kg     Family History  Problem Relation Age of Onset   Colon cancer Mother    Hypertension Father    Seizures Father    Stroke Father    Dementia Paternal Grandmother    Heart attack Paternal Grandfather      Atrial Fibrillation Management history:  Previous antiarrhythmic drugs: amiodarone  Previous cardioversions: 2019, 07/04/20 Previous ablations: 10/11/20 CHADS2VASC score:  4 Anticoagulation history: Xarelto   Past Medical History:  Diagnosis Date   Atrial fibrillation ([place])    a. diagnosed on 10/22/2017; b. successful DCCV on 12/14/2017; Paroxysmal Persistent   Basal cell carcinoma (BCC) of back    Dr. Elvera Lennox   CAD (coronary artery disease)    a. LHC 01/31/2018: OM2 55-60%, FFR 0.93. pLAD 25%   CKD (chronic kidney disease) stage 3, GFR 30-59 ml/min (HCC)    Unilateral kidney   Dilated cardiomyopathy -related to A. fib RVR; resolved 10/2017   Thought to be related to A. fib RVR: a) echo 11/03/2017 showed LVEF of 35-40% with severe LVH --> follow-up echo January 2020 showed resolution with EF 55 to 60%.  Normal filling pressures.  Read as mild as opposed to severe LVH   Diverticulosis of colon    With polyps (Dr. Amedeo Plenty March 2017, followed by Dr. Glennon Hamilton)   Erectile dysfunction    Essential hypertension 2018   Gout    OSA on CPAP    [facility]   Status post nephrectomy 1965   Thrombocytopenia, unspecified ([place])    Platelet clumping (pseudothrombocytopenia)   Past Surgical History:  Procedure Laterality Date   APPENDECTOMY     During childhood   ATRIAL FIBRILLATION ABLATION N/A 10/11/2020   Procedure: ATRIAL FIBRILLATION ABLATION;  Surgeon: Constance Haw, MD;  Location: [place] [facility];  Service: Cardiovascular;  Laterality: N/A;   CARDIOVERSION N/A 12/14/2017   Procedure: CARDIOVERSION;  Surgeon: Sueanne Margarita, MD;  Location:  [place] [facility];  Service: Cardiovascular;  Laterality: N/A;   CARDIOVERSION N/A 07/04/2020   Procedure: CARDIOVERSION;  Surgeon: Sueanne Margarita, MD;  Location: [place];  Service: Cardiovascular;  Laterality: N/A;   CYST EXCISION     face   INTRAVASCULAR PRESSURE WIRE/FFR STUDY N/A 01/31/2018   Procedure: INTRAVASCULAR PRESSURE WIRE/FFR STUDY;  Surgeon: Leonie Man, MD;  Location: [place] [facility];  Service: Cardiovascular;  Laterality: N/A;   LEFT HEART CATH AND CORONARY  ANGIOGRAPHY N/A 01/31/2018   Procedure: LEFT HEART CATH AND CORONARY ANGIOGRAPHY;  Surgeon: Leonie Man, MD;  Location: [place] [facility];  Service: Cardiovascular: Single-vessel disease with roughly 60% lesion in major OM branch.  FFR negative.   NEPHRECTOMY Right 1965   TRANSTHORACIC ECHOCARDIOGRAM  10/2017    EF 35 -40% with severe LVH.  Diffuse global hypokinesis.  Mild aortic and mitral regurgitation.  Mild left and right atrial dilation.   TRANSTHORACIC ECHOCARDIOGRAM  02/2018   EF 55-60%, normal filling pressure. Mod LA dilation.  Mild to moderate TR. -> resolution of cardiomyopathy   VASECTOMY  1989    Current Outpatient Medications  Medication Sig Dispense Refill   acetaminophen (TYLENOL) 500 MG tablet Take 1,000 mg by mouth every 6 (six) hours as needed for moderate pain.     allopurinol (ZYLOPRIM) 300 MG tablet Take 150 mg by mouth daily.      atorvastatin (LIPITOR) 40 MG tablet Take 1 tablet (40 mg total) by mouth daily. 90 tablet 3   b complex-vitamin c-folic acid (NEPHRO-VITE) 0.8 MG TABS tablet Take 1 tablet by mouth at bedtime.     loratadine (CLARITIN) 10 MG tablet Take 10 mg by mouth daily as needed for allergies.     losartan (COZAAR) 50 MG tablet Take 1 tablet (50 mg total) by mouth daily. 90 tablet 3   metoprolol succinate (TOPROL-XL) 50 MG 24 hr tablet TAKE 1 TABLET BY MOUTH ONCE DAILY WITH MEALS 90 tablet 3   rivaroxaban (XARELTO) 20 MG TABS tablet Take 1 tablet (20 mg total) by mouth daily with supper. 90 tablet 1   sertraline (ZOLOFT) 50 MG tablet Take 50 mg by mouth daily.      No current facility-administered medications for this encounter.    No Known Allergies  Social History   Socioeconomic History   Marital status: Married    Spouse name: Not on file   Number of children: Not on file   Years of education: Not on file   Highest education level: Not on file  Occupational History   Not on file  Tobacco Use   Smoking status: Never   Smokeless  tobacco: Never  Vaping Use   Vaping Use: Never used  Substance and Sexual Activity   Alcohol use: Yes    Alcohol/week: 2.0 standard drinks    Types: 1 Glasses of wine, 1 Cans of beer per week   Drug use: Never   Sexual activity: Not on file  Other Topics Concern   Not on file  Social History Narrative   He is married now for 4 years (remarried).  He has 1 child (presumably from previous marriage -73 years old).   He lives with his wife.  He is an avid Chief Executive Officer.   He is a retired Producer, television/film/video for [employer].  (He has a BA in American studies at [employer].)   Never smoked.  Drinks 5-7 alcoholic beverages a week usually beer or hard cider.  May be  occasionally a mixed drink.   He is quite active exercise at least 4 days a week for 30 minutes at a time.       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  He enjoys walking on both treadmill and in the community.  He plays golf routinely.   Social Determinants of Health   Financial Resource Strain: Not on file  Food Insecurity: Not on file  Transportation Needs: Not on file  Physical Activity: Not on file  Stress: Not on file  Social Connections: Not on file  Intimate Partner Violence: Not on file     ROS- All systems are reviewed and negative except as per the HPI above.  Physical Exam: Vitals:   11/11/20 1337  BP: 140/80  Pulse: (!) 55  Weight: 107 kg  Height: '5\' 11"'$  (1.803 m)    GEN- The patient is a well appearing obese male, alert and oriented x 3 today.   HEENT-head normocephalic, atraumatic, sclera clear, conjunctiva pink, hearing intact, trachea midline. Lungs- Clear to ausculation bilaterally, normal work of breathing Heart- Regular rate and rhythm, no murmurs, rubs or gallops  GI- soft, NT, ND, + BS Extremities- no clubbing, cyanosis, or edema MS- no significant deformity or atrophy Skin- no rash or  lesion Psych- euthymic mood, full affect Neuro- strength and sensation are intact   Wt Readings from Last 3 Encounters:  11/11/20 107 kg  10/21/20 105.7 kg  10/11/20 107 kg    EKG today demonstrates  SB Vent. rate 55 BPM PR interval 206 ms QRS duration 98 ms QT/QTcB 428/409 ms  Echo 03/23/18 demonstrated  1. The left ventricle appears to be normal in size, have mild wall  thickness, with normal systolic function of 0000000. Echo evidence of normal in diastolic filling patterns.   2. Right ventricular systolic pressure is is mildly elevated.   3. The right ventricle is normal in size, has normal wall thickness and normal systolic function.   4. Moderately dilated left atrial size.   5. Normal right atrial size.   6. Mitral valve regurgitation is mild by color flow Doppler.   7. The mitral valve normal in structure and function.   8. Normal tricuspid valve.   9. Tricuspid regurgitation mild-moderate.  10. Aortic valve normal.  11. Aortic valve regurgitation is mild by color flow Doppler.  12. The aortic root and ascending aortaare normal is size and structure.  13. The inferior vena cava was dilated in size with >50% respiratory variablity.  14. No atrial level shunt detected by color flow Doppler.   Epic records are reviewed at length today  CHA2DS2-VASc Score = 4  The patient's score is based upon: CHF History: 1 HTN History: 1 Diabetes History: 0 Stroke History: 0 Vascular Disease History: 1 Age Score: 1 Gender Score: 0      ASSESSMENT AND PLAN: 1. Persistent Atrial Fibrillation (ICD10:  I48.19) The patient's CHA2DS2-VASc score is 4, indicating a 4.8% annual risk of stroke.   S/p afib ablation 10/11/20 Patient appears to be maintaining SR. Continue Toprol 50 mg daily Continue Xarelto 20 mg daily with no missed doses for 3 months post ablation.   2. Secondary Hypercoagulable State (ICD10:  D68.69) The patient is at significant risk for stroke/thromboembolism  based upon his CHA2DS2-VASc Score of 4.  Continue Rivaroxaban (Xarelto).   3. Obesity Body mass index is 32.89 kg/m. Lifestyle modification was discussed and encouraged  including regular physical activity and weight reduction.  4. Obstructive sleep apnea Patient reports compliance with CPAP therapy.  5. HTN Stable, no changes today.  6. Cardiomyopathy Suspected tachycardia mediated     Follow up with Dr Curt Bears as scheduled.   [facility] [address] [place], [place] [address] [phone] 11/11/2020 1:44 PM

## 2020-12-02 DIAGNOSIS — Z23 Encounter for immunization: Secondary | ICD-10-CM | POA: Diagnosis not present

## 2020-12-11 DIAGNOSIS — G4733 Obstructive sleep apnea (adult) (pediatric): Secondary | ICD-10-CM | POA: Diagnosis not present

## 2020-12-11 DIAGNOSIS — N1831 Chronic kidney disease, stage 3a: Secondary | ICD-10-CM | POA: Diagnosis not present

## 2020-12-11 DIAGNOSIS — Z0000 Encounter for general adult medical examination without abnormal findings: Secondary | ICD-10-CM | POA: Diagnosis not present

## 2020-12-11 DIAGNOSIS — I129 Hypertensive chronic kidney disease with stage 1 through stage 4 chronic kidney disease, or unspecified chronic kidney disease: Secondary | ICD-10-CM | POA: Diagnosis not present

## 2020-12-11 DIAGNOSIS — Z1389 Encounter for screening for other disorder: Secondary | ICD-10-CM | POA: Diagnosis not present

## 2020-12-11 DIAGNOSIS — D696 Thrombocytopenia, unspecified: Secondary | ICD-10-CM | POA: Diagnosis not present

## 2020-12-11 DIAGNOSIS — Z79899 Other long term (current) drug therapy: Secondary | ICD-10-CM | POA: Diagnosis not present

## 2020-12-11 DIAGNOSIS — Z1159 Encounter for screening for other viral diseases: Secondary | ICD-10-CM | POA: Diagnosis not present

## 2020-12-11 DIAGNOSIS — M109 Gout, unspecified: Secondary | ICD-10-CM | POA: Diagnosis not present

## 2020-12-11 DIAGNOSIS — I480 Paroxysmal atrial fibrillation: Secondary | ICD-10-CM | POA: Diagnosis not present

## 2020-12-18 DIAGNOSIS — N1832 Chronic kidney disease, stage 3b: Secondary | ICD-10-CM | POA: Diagnosis not present

## 2020-12-18 DIAGNOSIS — I129 Hypertensive chronic kidney disease with stage 1 through stage 4 chronic kidney disease, or unspecified chronic kidney disease: Secondary | ICD-10-CM | POA: Diagnosis not present

## 2020-12-18 DIAGNOSIS — I509 Heart failure, unspecified: Secondary | ICD-10-CM | POA: Diagnosis not present

## 2020-12-18 DIAGNOSIS — Z905 Acquired absence of kidney: Secondary | ICD-10-CM | POA: Diagnosis not present

## 2020-12-18 DIAGNOSIS — I4891 Unspecified atrial fibrillation: Secondary | ICD-10-CM | POA: Diagnosis not present

## 2020-12-25 ENCOUNTER — Other Ambulatory Visit: Payer: Self-pay | Admitting: Adult Health

## 2020-12-26 NOTE — Telephone Encounter (Signed)
Prescription refill request for Xarelto received.  Indication:Afib Last office visit:6/22 Weight:107 kg Age:75 Scr:1.4 CrCl:71.12 ml/min  Prescription refilled

## 2021-01-07 ENCOUNTER — Telehealth: Payer: Self-pay | Admitting: *Deleted

## 2021-01-07 DIAGNOSIS — D225 Melanocytic nevi of trunk: Secondary | ICD-10-CM | POA: Diagnosis not present

## 2021-01-07 DIAGNOSIS — L821 Other seborrheic keratosis: Secondary | ICD-10-CM | POA: Diagnosis not present

## 2021-01-07 DIAGNOSIS — Z85828 Personal history of other malignant neoplasm of skin: Secondary | ICD-10-CM | POA: Diagnosis not present

## 2021-01-07 DIAGNOSIS — L820 Inflamed seborrheic keratosis: Secondary | ICD-10-CM | POA: Diagnosis not present

## 2021-01-07 DIAGNOSIS — D2262 Melanocytic nevi of left upper limb, including shoulder: Secondary | ICD-10-CM | POA: Diagnosis not present

## 2021-01-07 DIAGNOSIS — D1801 Hemangioma of skin and subcutaneous tissue: Secondary | ICD-10-CM | POA: Diagnosis not present

## 2021-01-07 DIAGNOSIS — L814 Other melanin hyperpigmentation: Secondary | ICD-10-CM | POA: Diagnosis not present

## 2021-01-07 DIAGNOSIS — D485 Neoplasm of uncertain behavior of skin: Secondary | ICD-10-CM | POA: Diagnosis not present

## 2021-01-07 DIAGNOSIS — D2261 Melanocytic nevi of right upper limb, including shoulder: Secondary | ICD-10-CM | POA: Diagnosis not present

## 2021-01-07 NOTE — Telephone Encounter (Addendum)
   Name: Bruce Yoder DOB: Jun 07, 1945  MRN: [id number]  Primary Cardiologist: Glenetta Hew, MD  Chart reviewed as part of pre-operative protocol coverage.   75 yo male with: Mod non-obstructive coronary artery disease  Paroxysmal atrial fibrillation  S/p PVI ablation 09/2020 w/ Dr. Curt Bears Non-ischemic cardiomyopathy due to tachycardia (AFib) EF returned to normal in NSR. (HFpEF) heart failure with preserved ejection fraction  Hypertension   RCRI:  Perioperative Risk of Major Cardiac Event is (%): 0.9 (low risk) DASI:  Functional Capacity in METs is: 4.31 (functional status is fair )  Patient was contacted 01/07/2021 in reference to pre-operative risk assessment for pending surgery as outlined below.    Since last seen, Bruce Yoder has done well without chest pain, shortness of breath, orthopnea, leg edema.    The patient will be 90 days out from his ablation at the time his colonoscopy is done.  Chart reviewed by PharmD team.  Patient should be able to hold Xarelto for 1-2 days prior to procedure.  The patient's hx was also reviewed with Dr. Curt Bears who agreed Xarelto can be held as long as the patient is 90 days out from the ablation.  Recommendations: Based on ACC/AHA guidelines, the patient is at acceptable risk for the planned procedure and may proceed without further cardiovascular testing.  Rivaroxaban (Xarelto) can be held for 1-2 days prior to the procedure.  It should be resumed after the procedure as soon as it is felt to be safe  Please call with questions. Richardson Dopp, PA-C 01/07/2021, 2:36 PM

## 2021-01-07 NOTE — Telephone Encounter (Signed)
Patient with diagnosis of afib on Xarelto for anticoagulation.    Procedure: COLONOSCOPY / ENDOSCOPY Date of procedure: 01/14/21   CHA2DS2-VASc Score = 4   This indicates a 4.8% annual risk of stroke. The patient's score is based upon: CHF History: 1 HTN History: 1 Diabetes History: 0 Stroke History: 0 Vascular Disease History: 1 Age Score: 1 Gender Score: 0      CrCl 43 ml/min (adjusted) 9ml/min (actual)  Per office protocol, patient can hold Xarelto for 1-2 days prior to procedure.    Patient will be outside of his 90 days post ablation. Ok to hold as long as ok with Dr. Curt Bears.

## 2021-01-07 NOTE — Telephone Encounter (Signed)
   [facility] Pre-operative Risk Assessment    Patient Name: Bruce Yoder  DOB: 1945-05-09 MRN: [id number]  HEARTCARE STAFF:  - IMPORTANT!!!!!! Under Visit Info/Reason for Call, type in Other and utilize the format Clearance MM/DD/YY or Clearance TBD. Do not use dashes or single digits. - Please review there is not already an duplicate clearance open for this procedure. - If request is for dental extraction, please clarify the # of teeth to be extracted. - If the patient is currently at the dentist's office, call Pre-Op Callback Staff (MA/nurse) to input urgent request.  - If the patient is not currently in the dentist office, please route to the Pre-Op pool.  Request for surgical clearance:  What type of surgery is being performed?  COLONOSCOPY / ENDOSCOPY  When is this surgery scheduled?  01/14/21  What type of clearance is required (medical clearance vs. Pharmacy clearance to hold med vs. Both)?  BOTH  Are there any medications that need to be held prior to surgery and how long?  [facility]   Practice name and name of physician performing surgery?  [facility] / DR. Paulita Fujita   What is the office phone number?  [phone]   7.   What is the office fax number?  [phone]  8.   Anesthesia type (None, local, MAC, general) ?  PROPOFOL    Jeanann Lewandowsky 01/07/2021, 9:06 AM  _________________________________________________________________   (provider comments below)

## 2021-01-07 NOTE — Telephone Encounter (Signed)
Notes faxed to surgeon. This phone note will be removed from the preop pool. Richardson Dopp, PA-C  01/07/2021 4:24 PM

## 2021-01-07 NOTE — Telephone Encounter (Signed)
Will route to PharmD for rec's re: holding anticoagulation. PVI ablation performed on 10/11/2020.  I will also route to Dr. Curt Bears for input.  Procedure date is 11/22 which is just beyond 90 days since the ablation. Richardson Dopp, PA-C    01/07/2021 9:17 AM

## 2021-01-09 DIAGNOSIS — H25813 Combined forms of age-related cataract, bilateral: Secondary | ICD-10-CM | POA: Diagnosis not present

## 2021-01-09 DIAGNOSIS — H524 Presbyopia: Secondary | ICD-10-CM | POA: Diagnosis not present

## 2021-01-14 DIAGNOSIS — Z86010 Personal history of colonic polyps: Secondary | ICD-10-CM | POA: Diagnosis not present

## 2021-01-14 DIAGNOSIS — K5730 Diverticulosis of large intestine without perforation or abscess without bleeding: Secondary | ICD-10-CM | POA: Diagnosis not present

## 2021-01-14 DIAGNOSIS — D120 Benign neoplasm of cecum: Secondary | ICD-10-CM | POA: Diagnosis not present

## 2021-01-14 DIAGNOSIS — D123 Benign neoplasm of transverse colon: Secondary | ICD-10-CM | POA: Diagnosis not present

## 2021-01-14 DIAGNOSIS — D122 Benign neoplasm of ascending colon: Secondary | ICD-10-CM | POA: Diagnosis not present

## 2021-01-15 ENCOUNTER — Encounter: Payer: Self-pay | Admitting: Cardiology

## 2021-01-15 ENCOUNTER — Ambulatory Visit (INDEPENDENT_AMBULATORY_CARE_PROVIDER_SITE_OTHER): Payer: Medicare Other | Admitting: Cardiology

## 2021-01-15 ENCOUNTER — Other Ambulatory Visit: Payer: Self-pay

## 2021-01-15 VITALS — BP 136/76 | HR 66 | Ht 71.0 in | Wt 236.2 lb

## 2021-01-15 DIAGNOSIS — I4819 Other persistent atrial fibrillation: Secondary | ICD-10-CM

## 2021-01-15 DIAGNOSIS — I2510 Atherosclerotic heart disease of native coronary artery without angina pectoris: Secondary | ICD-10-CM | POA: Diagnosis not present

## 2021-01-15 NOTE — Progress Notes (Signed)
Electrophysiology Office Note   Date:  01/15/2021   ID:  Bruce Yoder, DOB 08-16-1945, MRN [id number]  PCP:  Lajean Manes, MD  Cardiologist:  Ellyn Hack Primary Electrophysiologist:  Zachery Niswander Meredith Leeds, MD    Chief Complaint: AF   History of Present Illness: Bruce Yoder is a 75 y.o. male who is being seen today for the evaluation of AF at the request of Lajean Manes, MD. Presenting today for electrophysiology evaluation.  He has a history significant for coronary artery disease, CKD with unilateral kidney, hypertension, OSA, atrial fibrillation.  He is on Xarelto and amiodarone.  He was found to have persistent atrial fibrillation in 2019 with a reduced ejection fraction.  He underwent cardioversion but was noted to be in atrial fibrillation again May 2022.  He was started on amiodarone.  He has now status post ablation 10/11/2020.  Today, denies symptoms of palpitations, chest pain, shortness of breath, orthopnea, PND, lower extremity edema, claudication, dizziness, presyncope, syncope, bleeding, or neurologic sequela. The patient is tolerating medications without difficulties.  Since being seen he has done well.  He has noted no further episodes of atrial fibrillation.  He is ambulatory daily activities without restriction.  He is not aware of any further episodes and is overall comfortable with his control.   Past Medical History:  Diagnosis Date   Atrial fibrillation ([place])    a. diagnosed on 10/22/2017; b. successful DCCV on 12/14/2017; Paroxysmal Persistent   Basal cell carcinoma (BCC) of back    Dr. Elvera Lennox   CAD (coronary artery disease)    a. LHC 01/31/2018: OM2 55-60%, FFR 0.93. pLAD 25%   CKD (chronic kidney disease) stage 3, GFR 30-59 ml/min (HCC)    Unilateral kidney   Dilated cardiomyopathy -related to A. fib RVR; resolved 10/2017   Thought to be related to A. fib RVR: a) echo 11/03/2017 showed LVEF of 35-40% with severe LVH --> follow-up echo January 2020 showed  resolution with EF 55 to 60%.  Normal filling pressures.  Read as mild as opposed to severe LVH   Diverticulosis of colon    With polyps (Dr. Amedeo Plenty March 2017, followed by Dr. Glennon Hamilton)   Erectile dysfunction    Essential hypertension 2018   Gout    OSA on CPAP    [facility]   Status post nephrectomy 1965   Thrombocytopenia, unspecified ([place])    Platelet clumping (pseudothrombocytopenia)   Past Surgical History:  Procedure Laterality Date   APPENDECTOMY     During childhood   ATRIAL FIBRILLATION ABLATION N/A 10/11/2020   Procedure: ATRIAL FIBRILLATION ABLATION;  Surgeon: Constance Haw, MD;  Location: [place] [facility];  Service: Cardiovascular;  Laterality: N/A;   CARDIOVERSION N/A 12/14/2017   Procedure: CARDIOVERSION;  Surgeon: Sueanne Margarita, MD;  Location: [facility];  Service: Cardiovascular;  Laterality: N/A;   CARDIOVERSION N/A 07/04/2020   Procedure: CARDIOVERSION;  Surgeon: Sueanne Margarita, MD;  Location: [place];  Service: Cardiovascular;  Laterality: N/A;   CYST EXCISION     face   INTRAVASCULAR PRESSURE WIRE/FFR STUDY N/A 01/31/2018   Procedure: INTRAVASCULAR PRESSURE WIRE/FFR STUDY;  Surgeon: Leonie Man, MD;  Location: [place] [facility];  Service: Cardiovascular;  Laterality: N/A;   LEFT HEART CATH AND CORONARY ANGIOGRAPHY N/A 01/31/2018   Procedure: LEFT HEART CATH AND CORONARY ANGIOGRAPHY;  Surgeon: Leonie Man, MD;  Location: [place] [facility];  Service: Cardiovascular: Single-vessel disease with roughly 60% lesion in major OM branch.  FFR negative.   NEPHRECTOMY Right 1965   TRANSTHORACIC ECHOCARDIOGRAM  10/2017    EF 35 -40% with severe LVH.  Diffuse global hypokinesis.  Mild aortic and mitral regurgitation.  Mild left and right atrial dilation.   TRANSTHORACIC ECHOCARDIOGRAM  02/2018   EF 55-60%, normal filling pressure. Mod LA dilation.  Mild to moderate TR. -> resolution of cardiomyopathy   VASECTOMY  1989      Current Outpatient Medications  Medication Sig Dispense Refill   acetaminophen (TYLENOL) 500 MG tablet Take 1,000 mg by mouth every 6 (six) hours as needed for moderate pain.     allopurinol (ZYLOPRIM) 300 MG tablet Take 150 mg by mouth daily.      atorvastatin (LIPITOR) 40 MG tablet Take 1 tablet (40 mg total) by mouth daily. 90 tablet 3   b complex-vitamin c-folic acid (NEPHRO-VITE) 0.8 MG TABS tablet Take 1 tablet by mouth at bedtime.     loratadine (CLARITIN) 10 MG tablet Take 10 mg by mouth daily as needed for allergies.     losartan (COZAAR) 50 MG tablet Take 1 tablet (50 mg total) by mouth daily. 90 tablet 3   metoprolol succinate (TOPROL-XL) 50 MG 24 hr tablet TAKE 1 TABLET BY MOUTH ONCE DAILY WITH MEALS 90 tablet 3   rivaroxaban (XARELTO) 20 MG TABS tablet TAKE 1 TABLET BY MOUTH ONCE DAILY WITH SUPPER 90 tablet 1   sertraline (ZOLOFT) 50 MG tablet Take 50 mg by mouth daily.      No current facility-administered medications for this visit.    Allergies:   Patient has no known allergies.   Social History:  The patient  reports that he has never smoked. He has never used smokeless tobacco. He reports current alcohol use of about 2.0 standard drinks per week. He reports that he does not use drugs.   Family History:  The patient's family history includes Colon cancer in his mother; Dementia in his paternal grandmother; Heart attack in his paternal grandfather; Hypertension in his father; Seizures in his father; Stroke in his father.   ROS:  Please see the history of present illness.   Otherwise, review of systems is positive for none.   All other systems are reviewed and negative.   PHYSICAL EXAM: VS:  There were no vitals taken for this visit. , BMI There is no height or weight on file to calculate BMI. GEN: Well nourished, well developed, in no acute distress  HEENT: norm al  Neck: no JVD, carotid bruits, or masses Cardiac: RRR; no murmurs, rubs, or gallops,no edema   Respiratory:  clear to auscultation bilaterally, normal work of breathing GI: soft, nontender, nondistended, + BS MS: no deformity or atrophy  Skin: warm and dry Neuro:  Strength and sensation are intact Psych: euthymic mood, full affect  EKG:  EKG is ordered today. Personal review of the ekg ordered shows sinus rhythm, rate 66, left anterior fascicular [place]  Recent Labs: 09/16/2020: BUN 25; Creatinine, Ser 1.84; Hemoglobin 13.9; Platelets 112; Potassium 4.6; Sodium 140    Lipid Panel     Component Value Date/Time   CHOL 142 02/01/2018 0709   TRIG 105 02/01/2018 0709   HDL 34 (L) 02/01/2018 0709   CHOLHDL 4.2 02/01/2018 0709   VLDL 21 02/01/2018 0709   LDLCALC 87 02/01/2018 0709     Wt Readings from Last 3 Encounters:  11/11/20 235 lb 12.8 oz (107 kg)  10/21/20 233 lb (105.7 kg)  10/11/20 236 lb (107 kg)  Other studies Reviewed: Additional studies/ records that were reviewed today include: TTE 03/23/18  Review of the above records today demonstrates:   1. The left ventricle appears to be normal in size, have mild wall  thickness, with normal systolic function of 03-55%. Echo evidence of  normal in diastolic filling patterns.   2. Right ventricular systolic pressure is is mildly elevated.   3. The right ventricle is normal in size, has normal wall thickness and  normal systolic function.   4. Moderately dilated left atrial size.   5. Normal right atrial size.   6. Mitral valve regurgitation is mild by color flow Doppler.   7. The mitral valve normal in structure and function.   8. Normal tricuspid valve.   9. Tricuspid regurgitation mild-moderate.  10. Aortic valve normal.  11. Aortic valve regurgitation is mild by color flow Doppler.  12. The aortic root and ascending aortaare normal is size and structure.  13. The inferior vena cava was dilated in size with >50% respiratory  variablity.  14. No atrial level shunt detected by color flow Doppler.   LHC  01/31/18 Ost 2nd Mrg lesion is 55-60% stenosed. FFR 0.93 (not physiologically significant) Prox LAD lesion is 25% stenosed. LV end diastolic pressure is moderately elevated.  ASSESSMENT AND PLAN:  1.  Persistent atrial fibrillation: Currently on Eliquis 5 mg twice daily.  CHA2DS2-VASc of 4.  He has not status post ablation 10/11/2020.  He is remained in sinus rhythm.  He is happy with the way he has been feeling.  Continue with current management.  2.  Obesity: There is no height or weight on file to calculate BMI.  Diet and exercise encouraged  3.  Obstructive sleep apnea: CPAP compliance encouraged  4.  Hypertension: Currently well controlled  5.  CKD: Has a solitary kidney and is followed by nephrology.  Current medicines are reviewed at length with the patient today.   The patient does not have concerns regarding his medicines.  The following changes were made today: None  Labs/ tests ordered today include:  No orders of the defined types were placed in this encounter.     Disposition:   FU with Damyan Corne 3 months  Signed, Carmel Garfield Meredith Leeds, MD  01/15/2021 1:47 PM     [place] [address] [phone] (office) [phone] (fax)

## 2021-01-15 NOTE — Patient Instructions (Signed)
Medication Instructions:  Your physician recommends that you continue on your current medications as directed. Please refer to the Current Medication list given to you today.  *If you need a refill on your cardiac medications before your next appointment, please call your pharmacy*   Lab Work: None ordered   Testing/Procedures: None ordered   Follow-Up: At [facility], you and your health needs are our priority.  As part of our continuing mission to provide you with exceptional heart care, we have created designated Provider Care Teams.  These Care Teams include your primary Cardiologist (physician) and Advanced Practice Providers (APPs -  Physician Assistants and Nurse Practitioners) who all work together to provide you with the care you need, when you need it.  Your next appointment:   3 month(s)  The format for your next appointment:   In Person  Provider:   Will Camnitz, MD    Thank you for choosing [facility]!!   Brodrick Curran, RN [phone]     

## 2021-01-20 DIAGNOSIS — D123 Benign neoplasm of transverse colon: Secondary | ICD-10-CM | POA: Diagnosis not present

## 2021-01-20 DIAGNOSIS — D122 Benign neoplasm of ascending colon: Secondary | ICD-10-CM | POA: Diagnosis not present

## 2021-01-20 DIAGNOSIS — D120 Benign neoplasm of cecum: Secondary | ICD-10-CM | POA: Diagnosis not present

## 2021-02-25 DIAGNOSIS — I480 Paroxysmal atrial fibrillation: Secondary | ICD-10-CM | POA: Diagnosis not present

## 2021-02-25 DIAGNOSIS — G4733 Obstructive sleep apnea (adult) (pediatric): Secondary | ICD-10-CM | POA: Diagnosis not present

## 2021-02-25 DIAGNOSIS — Z9989 Dependence on other enabling machines and devices: Secondary | ICD-10-CM | POA: Diagnosis not present

## 2021-02-28 ENCOUNTER — Other Ambulatory Visit: Payer: Self-pay | Admitting: Cardiology

## 2021-02-28 NOTE — Telephone Encounter (Signed)
Prescription refill request for Xarelto received.  Indication: afib  Last office visit: Camnitz, 01/15/2021 Weight: 107.1 kg  Age: 76 yo  Scr: 1.84, 09/16/2020 CrCl: 54 ml/min   Refill sent.

## 2021-03-07 DIAGNOSIS — F4322 Adjustment disorder with anxiety: Secondary | ICD-10-CM | POA: Diagnosis not present

## 2021-05-01 ENCOUNTER — Ambulatory Visit: Payer: Medicare Other | Admitting: Cardiology

## 2021-05-01 DIAGNOSIS — F4323 Adjustment disorder with mixed anxiety and depressed mood: Secondary | ICD-10-CM | POA: Diagnosis not present

## 2021-05-15 DIAGNOSIS — F4323 Adjustment disorder with mixed anxiety and depressed mood: Secondary | ICD-10-CM | POA: Diagnosis not present

## 2021-06-10 DIAGNOSIS — E7800 Pure hypercholesterolemia, unspecified: Secondary | ICD-10-CM | POA: Diagnosis not present

## 2021-06-10 DIAGNOSIS — I4891 Unspecified atrial fibrillation: Secondary | ICD-10-CM | POA: Diagnosis not present

## 2021-06-10 DIAGNOSIS — N189 Chronic kidney disease, unspecified: Secondary | ICD-10-CM | POA: Diagnosis not present

## 2021-06-10 DIAGNOSIS — N529 Male erectile dysfunction, unspecified: Secondary | ICD-10-CM | POA: Diagnosis not present

## 2021-06-10 DIAGNOSIS — G4733 Obstructive sleep apnea (adult) (pediatric): Secondary | ICD-10-CM | POA: Diagnosis not present

## 2021-06-10 DIAGNOSIS — I10 Essential (primary) hypertension: Secondary | ICD-10-CM | POA: Diagnosis not present

## 2021-06-10 DIAGNOSIS — M109 Gout, unspecified: Secondary | ICD-10-CM | POA: Diagnosis not present

## 2021-06-12 DIAGNOSIS — F4323 Adjustment disorder with mixed anxiety and depressed mood: Secondary | ICD-10-CM | POA: Diagnosis not present

## 2021-06-16 DIAGNOSIS — M170 Bilateral primary osteoarthritis of knee: Secondary | ICD-10-CM | POA: Diagnosis not present

## 2021-06-17 DIAGNOSIS — Z0000 Encounter for general adult medical examination without abnormal findings: Secondary | ICD-10-CM | POA: Diagnosis not present

## 2021-06-17 DIAGNOSIS — Z1329 Encounter for screening for other suspected endocrine disorder: Secondary | ICD-10-CM | POA: Diagnosis not present

## 2021-06-17 DIAGNOSIS — Z7289 Other problems related to lifestyle: Secondary | ICD-10-CM | POA: Diagnosis not present

## 2021-06-17 DIAGNOSIS — Z113 Encounter for screening for infections with a predominantly sexual mode of transmission: Secondary | ICD-10-CM | POA: Diagnosis not present

## 2021-06-17 DIAGNOSIS — Z13220 Encounter for screening for lipoid disorders: Secondary | ICD-10-CM | POA: Diagnosis not present

## 2021-06-17 DIAGNOSIS — Z114 Encounter for screening for human immunodeficiency virus [HIV]: Secondary | ICD-10-CM | POA: Diagnosis not present

## 2021-06-17 DIAGNOSIS — I10 Essential (primary) hypertension: Secondary | ICD-10-CM | POA: Diagnosis not present

## 2021-06-17 DIAGNOSIS — Z1159 Encounter for screening for other viral diseases: Secondary | ICD-10-CM | POA: Diagnosis not present

## 2021-06-17 DIAGNOSIS — R239 Unspecified skin changes: Secondary | ICD-10-CM | POA: Diagnosis not present

## 2021-06-17 DIAGNOSIS — Z125 Encounter for screening for malignant neoplasm of prostate: Secondary | ICD-10-CM | POA: Diagnosis not present

## 2021-06-19 DIAGNOSIS — I480 Paroxysmal atrial fibrillation: Secondary | ICD-10-CM | POA: Diagnosis not present

## 2021-06-19 DIAGNOSIS — G4733 Obstructive sleep apnea (adult) (pediatric): Secondary | ICD-10-CM | POA: Diagnosis not present

## 2021-06-19 DIAGNOSIS — I10 Essential (primary) hypertension: Secondary | ICD-10-CM | POA: Diagnosis not present

## 2021-06-19 DIAGNOSIS — I4891 Unspecified atrial fibrillation: Secondary | ICD-10-CM | POA: Diagnosis not present

## 2021-06-26 DIAGNOSIS — M25512 Pain in left shoulder: Secondary | ICD-10-CM | POA: Diagnosis not present

## 2021-06-26 DIAGNOSIS — M19012 Primary osteoarthritis, left shoulder: Secondary | ICD-10-CM | POA: Diagnosis not present

## 2021-07-10 DIAGNOSIS — Z23 Encounter for immunization: Secondary | ICD-10-CM | POA: Diagnosis not present

## 2021-07-10 DIAGNOSIS — F4323 Adjustment disorder with mixed anxiety and depressed mood: Secondary | ICD-10-CM | POA: Diagnosis not present

## 2021-07-14 DIAGNOSIS — M25561 Pain in right knee: Secondary | ICD-10-CM | POA: Diagnosis not present

## 2021-07-14 DIAGNOSIS — M25562 Pain in left knee: Secondary | ICD-10-CM | POA: Diagnosis not present

## 2021-07-18 DIAGNOSIS — N529 Male erectile dysfunction, unspecified: Secondary | ICD-10-CM | POA: Diagnosis not present

## 2021-07-18 DIAGNOSIS — E559 Vitamin D deficiency, unspecified: Secondary | ICD-10-CM | POA: Diagnosis not present

## 2021-07-18 DIAGNOSIS — Z905 Acquired absence of kidney: Secondary | ICD-10-CM | POA: Diagnosis not present

## 2021-07-18 DIAGNOSIS — I129 Hypertensive chronic kidney disease with stage 1 through stage 4 chronic kidney disease, or unspecified chronic kidney disease: Secondary | ICD-10-CM | POA: Diagnosis not present

## 2021-07-18 DIAGNOSIS — N1831 Chronic kidney disease, stage 3a: Secondary | ICD-10-CM | POA: Diagnosis not present

## 2021-07-18 DIAGNOSIS — N189 Chronic kidney disease, unspecified: Secondary | ICD-10-CM | POA: Diagnosis not present

## 2021-07-18 DIAGNOSIS — I10 Essential (primary) hypertension: Secondary | ICD-10-CM | POA: Diagnosis not present

## 2021-07-23 DIAGNOSIS — M19012 Primary osteoarthritis, left shoulder: Secondary | ICD-10-CM | POA: Diagnosis not present

## 2021-08-07 DIAGNOSIS — F4323 Adjustment disorder with mixed anxiety and depressed mood: Secondary | ICD-10-CM | POA: Diagnosis not present

## 2021-08-12 DIAGNOSIS — E213 Hyperparathyroidism, unspecified: Secondary | ICD-10-CM | POA: Diagnosis not present

## 2021-08-13 DIAGNOSIS — E213 Hyperparathyroidism, unspecified: Secondary | ICD-10-CM | POA: Diagnosis not present

## 2021-08-13 DIAGNOSIS — Z13820 Encounter for screening for osteoporosis: Secondary | ICD-10-CM | POA: Diagnosis not present

## 2021-08-15 DIAGNOSIS — E213 Hyperparathyroidism, unspecified: Secondary | ICD-10-CM | POA: Diagnosis not present

## 2021-09-04 DIAGNOSIS — F4323 Adjustment disorder with mixed anxiety and depressed mood: Secondary | ICD-10-CM | POA: Diagnosis not present

## 2021-09-08 DIAGNOSIS — E213 Hyperparathyroidism, unspecified: Secondary | ICD-10-CM | POA: Diagnosis not present

## 2021-09-23 DIAGNOSIS — Z8679 Personal history of other diseases of the circulatory system: Secondary | ICD-10-CM | POA: Diagnosis not present

## 2021-09-23 DIAGNOSIS — E892 Postprocedural hypoparathyroidism: Secondary | ICD-10-CM | POA: Diagnosis not present

## 2021-09-23 DIAGNOSIS — R0602 Shortness of breath: Secondary | ICD-10-CM | POA: Diagnosis not present

## 2021-09-23 DIAGNOSIS — E0590 Thyrotoxicosis, unspecified without thyrotoxic crisis or storm: Secondary | ICD-10-CM | POA: Diagnosis not present

## 2021-10-16 DIAGNOSIS — F4323 Adjustment disorder with mixed anxiety and depressed mood: Secondary | ICD-10-CM | POA: Diagnosis not present

## 2021-12-04 DIAGNOSIS — M79671 Pain in right foot: Secondary | ICD-10-CM | POA: Diagnosis not present

## 2021-12-04 DIAGNOSIS — M79672 Pain in left foot: Secondary | ICD-10-CM | POA: Diagnosis not present

## 2021-12-04 DIAGNOSIS — Z23 Encounter for immunization: Secondary | ICD-10-CM | POA: Diagnosis not present

## 2021-12-09 DIAGNOSIS — F4323 Adjustment disorder with mixed anxiety and depressed mood: Secondary | ICD-10-CM | POA: Diagnosis not present

## 2021-12-15 DIAGNOSIS — M21611 Bunion of right foot: Secondary | ICD-10-CM | POA: Diagnosis not present

## 2021-12-15 DIAGNOSIS — M2041 Other hammer toe(s) (acquired), right foot: Secondary | ICD-10-CM | POA: Diagnosis not present

## 2021-12-15 DIAGNOSIS — M21612 Bunion of left foot: Secondary | ICD-10-CM | POA: Diagnosis not present

## 2021-12-15 DIAGNOSIS — M19072 Primary osteoarthritis, left ankle and foot: Secondary | ICD-10-CM | POA: Diagnosis not present

## 2021-12-15 DIAGNOSIS — M19071 Primary osteoarthritis, right ankle and foot: Secondary | ICD-10-CM | POA: Diagnosis not present

## 2021-12-15 DIAGNOSIS — Z8739 Personal history of other diseases of the musculoskeletal system and connective tissue: Secondary | ICD-10-CM | POA: Diagnosis not present

## 2021-12-15 DIAGNOSIS — M2012 Hallux valgus (acquired), left foot: Secondary | ICD-10-CM | POA: Diagnosis not present

## 2021-12-15 DIAGNOSIS — M2042 Other hammer toe(s) (acquired), left foot: Secondary | ICD-10-CM | POA: Diagnosis not present

## 2021-12-15 DIAGNOSIS — M2011 Hallux valgus (acquired), right foot: Secondary | ICD-10-CM | POA: Diagnosis not present

## 2021-12-15 DIAGNOSIS — I872 Venous insufficiency (chronic) (peripheral): Secondary | ICD-10-CM | POA: Diagnosis not present

## 2021-12-16 DIAGNOSIS — I444 Left anterior fascicular block: Secondary | ICD-10-CM | POA: Diagnosis not present

## 2022-01-07 DIAGNOSIS — X32XXXA Exposure to sunlight, initial encounter: Secondary | ICD-10-CM | POA: Diagnosis not present

## 2022-01-07 DIAGNOSIS — Z809 Family history of malignant neoplasm, unspecified: Secondary | ICD-10-CM | POA: Diagnosis not present

## 2022-01-07 DIAGNOSIS — L820 Inflamed seborrheic keratosis: Secondary | ICD-10-CM | POA: Diagnosis not present

## 2022-01-07 DIAGNOSIS — L821 Other seborrheic keratosis: Secondary | ICD-10-CM | POA: Diagnosis not present

## 2022-01-07 DIAGNOSIS — Z1283 Encounter for screening for malignant neoplasm of skin: Secondary | ICD-10-CM | POA: Diagnosis not present

## 2022-01-07 DIAGNOSIS — L814 Other melanin hyperpigmentation: Secondary | ICD-10-CM | POA: Diagnosis not present

## 2022-01-07 DIAGNOSIS — D225 Melanocytic nevi of trunk: Secondary | ICD-10-CM | POA: Diagnosis not present

## 2022-01-07 DIAGNOSIS — I781 Nevus, non-neoplastic: Secondary | ICD-10-CM | POA: Diagnosis not present

## 2022-02-04 DIAGNOSIS — I480 Paroxysmal atrial fibrillation: Secondary | ICD-10-CM | POA: Diagnosis not present

## 2022-02-04 DIAGNOSIS — D696 Thrombocytopenia, unspecified: Secondary | ICD-10-CM | POA: Diagnosis not present

## 2022-02-04 DIAGNOSIS — N183 Chronic kidney disease, stage 3 unspecified: Secondary | ICD-10-CM | POA: Diagnosis not present

## 2022-02-04 DIAGNOSIS — I428 Other cardiomyopathies: Secondary | ICD-10-CM | POA: Diagnosis not present

## 2022-02-04 DIAGNOSIS — Z905 Acquired absence of kidney: Secondary | ICD-10-CM | POA: Diagnosis not present

## 2022-02-04 DIAGNOSIS — I10 Essential (primary) hypertension: Secondary | ICD-10-CM | POA: Diagnosis not present

## 2022-02-04 DIAGNOSIS — I2510 Atherosclerotic heart disease of native coronary artery without angina pectoris: Secondary | ICD-10-CM | POA: Diagnosis not present

## 2022-02-04 DIAGNOSIS — E213 Hyperparathyroidism, unspecified: Secondary | ICD-10-CM | POA: Diagnosis not present

## 2022-02-11 DIAGNOSIS — F4322 Adjustment disorder with anxiety: Secondary | ICD-10-CM | POA: Diagnosis not present

## 2022-02-11 DIAGNOSIS — G4700 Insomnia, unspecified: Secondary | ICD-10-CM | POA: Diagnosis not present

## 2022-02-11 DIAGNOSIS — F331 Major depressive disorder, recurrent, moderate: Secondary | ICD-10-CM | POA: Diagnosis not present

## 2022-02-18 DIAGNOSIS — R45851 Suicidal ideations: Secondary | ICD-10-CM | POA: Diagnosis not present

## 2022-02-20 DIAGNOSIS — I2510 Atherosclerotic heart disease of native coronary artery without angina pectoris: Secondary | ICD-10-CM | POA: Diagnosis not present

## 2022-02-20 DIAGNOSIS — I4510 Unspecified right bundle-branch block: Secondary | ICD-10-CM | POA: Diagnosis not present

## 2022-02-20 DIAGNOSIS — Q600 Renal agenesis, unilateral: Secondary | ICD-10-CM | POA: Diagnosis not present

## 2022-02-20 DIAGNOSIS — I4891 Unspecified atrial fibrillation: Secondary | ICD-10-CM | POA: Diagnosis not present

## 2022-02-20 DIAGNOSIS — I129 Hypertensive chronic kidney disease with stage 1 through stage 4 chronic kidney disease, or unspecified chronic kidney disease: Secondary | ICD-10-CM | POA: Diagnosis not present

## 2022-02-20 DIAGNOSIS — I491 Atrial premature depolarization: Secondary | ICD-10-CM | POA: Diagnosis not present

## 2022-02-20 DIAGNOSIS — N183 Chronic kidney disease, stage 3 unspecified: Secondary | ICD-10-CM | POA: Diagnosis not present

## 2022-02-20 DIAGNOSIS — G4733 Obstructive sleep apnea (adult) (pediatric): Secondary | ICD-10-CM | POA: Diagnosis not present

## 2022-02-20 DIAGNOSIS — F331 Major depressive disorder, recurrent, moderate: Secondary | ICD-10-CM | POA: Diagnosis not present

## 2022-02-20 DIAGNOSIS — F332 Major depressive disorder, recurrent severe without psychotic features: Secondary | ICD-10-CM | POA: Diagnosis not present

## 2022-02-20 DIAGNOSIS — Z91148 Patient's other noncompliance with medication regimen for other reason: Secondary | ICD-10-CM | POA: Diagnosis not present

## 2022-02-20 DIAGNOSIS — R45851 Suicidal ideations: Secondary | ICD-10-CM | POA: Diagnosis present

## 2022-02-20 DIAGNOSIS — G4700 Insomnia, unspecified: Secondary | ICD-10-CM | POA: Diagnosis not present

## 2022-02-20 DIAGNOSIS — F419 Anxiety disorder, unspecified: Secondary | ICD-10-CM | POA: Diagnosis not present

## 2022-02-20 DIAGNOSIS — E7800 Pure hypercholesterolemia, unspecified: Secondary | ICD-10-CM | POA: Diagnosis not present

## 2022-02-24 DIAGNOSIS — F331 Major depressive disorder, recurrent, moderate: Secondary | ICD-10-CM | POA: Diagnosis not present

## 2022-02-25 DIAGNOSIS — F332 Major depressive disorder, recurrent severe without psychotic features: Secondary | ICD-10-CM | POA: Diagnosis not present

## 2022-02-25 DIAGNOSIS — F331 Major depressive disorder, recurrent, moderate: Secondary | ICD-10-CM | POA: Diagnosis not present

## 2022-02-25 DIAGNOSIS — F419 Anxiety disorder, unspecified: Secondary | ICD-10-CM | POA: Diagnosis not present

## 2022-02-27 DIAGNOSIS — N390 Urinary tract infection, site not specified: Secondary | ICD-10-CM | POA: Diagnosis not present

## 2022-02-27 DIAGNOSIS — F17210 Nicotine dependence, cigarettes, uncomplicated: Secondary | ICD-10-CM | POA: Diagnosis not present

## 2022-02-27 DIAGNOSIS — M109 Gout, unspecified: Secondary | ICD-10-CM | POA: Diagnosis not present

## 2022-02-27 DIAGNOSIS — Z7901 Long term (current) use of anticoagulants: Secondary | ICD-10-CM | POA: Diagnosis not present

## 2022-02-27 DIAGNOSIS — F332 Major depressive disorder, recurrent severe without psychotic features: Secondary | ICD-10-CM | POA: Diagnosis not present

## 2022-02-27 DIAGNOSIS — Z9151 Personal history of suicidal behavior: Secondary | ICD-10-CM | POA: Diagnosis not present

## 2022-02-27 DIAGNOSIS — R45851 Suicidal ideations: Secondary | ICD-10-CM | POA: Diagnosis not present

## 2022-02-27 DIAGNOSIS — F411 Generalized anxiety disorder: Secondary | ICD-10-CM | POA: Diagnosis not present

## 2022-02-27 DIAGNOSIS — I4891 Unspecified atrial fibrillation: Secondary | ICD-10-CM | POA: Diagnosis not present

## 2022-02-27 DIAGNOSIS — E785 Hyperlipidemia, unspecified: Secondary | ICD-10-CM | POA: Diagnosis not present

## 2022-02-27 DIAGNOSIS — F419 Anxiety disorder, unspecified: Secondary | ICD-10-CM | POA: Diagnosis not present

## 2022-02-27 DIAGNOSIS — I10 Essential (primary) hypertension: Secondary | ICD-10-CM | POA: Diagnosis not present

## 2022-03-13 DIAGNOSIS — F331 Major depressive disorder, recurrent, moderate: Secondary | ICD-10-CM | POA: Diagnosis not present

## 2022-03-16 DIAGNOSIS — F331 Major depressive disorder, recurrent, moderate: Secondary | ICD-10-CM | POA: Diagnosis not present

## 2022-03-17 DIAGNOSIS — F331 Major depressive disorder, recurrent, moderate: Secondary | ICD-10-CM | POA: Diagnosis not present

## 2022-03-18 DIAGNOSIS — F331 Major depressive disorder, recurrent, moderate: Secondary | ICD-10-CM | POA: Diagnosis not present

## 2022-03-19 DIAGNOSIS — F331 Major depressive disorder, recurrent, moderate: Secondary | ICD-10-CM | POA: Diagnosis not present

## 2022-03-20 DIAGNOSIS — F331 Major depressive disorder, recurrent, moderate: Secondary | ICD-10-CM | POA: Diagnosis not present

## 2022-03-23 DIAGNOSIS — F331 Major depressive disorder, recurrent, moderate: Secondary | ICD-10-CM | POA: Diagnosis not present

## 2022-03-24 DIAGNOSIS — F331 Major depressive disorder, recurrent, moderate: Secondary | ICD-10-CM | POA: Diagnosis not present

## 2022-03-25 DIAGNOSIS — F331 Major depressive disorder, recurrent, moderate: Secondary | ICD-10-CM | POA: Diagnosis not present

## 2022-03-25 DIAGNOSIS — F411 Generalized anxiety disorder: Secondary | ICD-10-CM | POA: Diagnosis not present

## 2022-03-26 DIAGNOSIS — F331 Major depressive disorder, recurrent, moderate: Secondary | ICD-10-CM | POA: Diagnosis not present

## 2022-03-26 DIAGNOSIS — F411 Generalized anxiety disorder: Secondary | ICD-10-CM | POA: Diagnosis not present

## 2022-03-26 DIAGNOSIS — F332 Major depressive disorder, recurrent severe without psychotic features: Secondary | ICD-10-CM | POA: Diagnosis not present

## 2022-03-27 DIAGNOSIS — F411 Generalized anxiety disorder: Secondary | ICD-10-CM | POA: Diagnosis not present

## 2022-03-27 DIAGNOSIS — F332 Major depressive disorder, recurrent severe without psychotic features: Secondary | ICD-10-CM | POA: Diagnosis not present

## 2022-03-30 DIAGNOSIS — F411 Generalized anxiety disorder: Secondary | ICD-10-CM | POA: Diagnosis not present

## 2022-03-30 DIAGNOSIS — F331 Major depressive disorder, recurrent, moderate: Secondary | ICD-10-CM | POA: Diagnosis not present

## 2022-03-30 DIAGNOSIS — F332 Major depressive disorder, recurrent severe without psychotic features: Secondary | ICD-10-CM | POA: Diagnosis not present

## 2022-03-31 DIAGNOSIS — F411 Generalized anxiety disorder: Secondary | ICD-10-CM | POA: Diagnosis not present

## 2022-03-31 DIAGNOSIS — F332 Major depressive disorder, recurrent severe without psychotic features: Secondary | ICD-10-CM | POA: Diagnosis not present

## 2022-04-01 DIAGNOSIS — F332 Major depressive disorder, recurrent severe without psychotic features: Secondary | ICD-10-CM | POA: Diagnosis not present

## 2022-04-01 DIAGNOSIS — F411 Generalized anxiety disorder: Secondary | ICD-10-CM | POA: Diagnosis not present

## 2022-04-02 ENCOUNTER — Other Ambulatory Visit: Payer: Self-pay

## 2022-04-02 ENCOUNTER — Encounter (HOSPITAL_COMMUNITY): Payer: Self-pay

## 2022-04-02 ENCOUNTER — Emergency Department (HOSPITAL_COMMUNITY)
Admission: EM | Admit: 2022-04-02 | Discharge: 2022-04-04 | Disposition: A | Discharge status: Home / Self Care | Payer: Medicare Other | Attending: Emergency Medicine | Admitting: Emergency Medicine

## 2022-04-02 DIAGNOSIS — Z7901 Long term (current) use of anticoagulants: Secondary | ICD-10-CM | POA: Diagnosis not present

## 2022-04-02 DIAGNOSIS — I129 Hypertensive chronic kidney disease with stage 1 through stage 4 chronic kidney disease, or unspecified chronic kidney disease: Secondary | ICD-10-CM | POA: Diagnosis not present

## 2022-04-02 DIAGNOSIS — I2510 Atherosclerotic heart disease of native coronary artery without angina pectoris: Secondary | ICD-10-CM | POA: Insufficient documentation

## 2022-04-02 DIAGNOSIS — N183 Chronic kidney disease, stage 3 unspecified: Secondary | ICD-10-CM | POA: Diagnosis not present

## 2022-04-02 DIAGNOSIS — R45851 Suicidal ideations: Secondary | ICD-10-CM | POA: Diagnosis not present

## 2022-04-02 DIAGNOSIS — R4781 Slurred speech: Secondary | ICD-10-CM | POA: Diagnosis not present

## 2022-04-02 DIAGNOSIS — T43592A Poisoning by other antipsychotics and neuroleptics, intentional self-harm, initial encounter: Secondary | ICD-10-CM | POA: Insufficient documentation

## 2022-04-02 DIAGNOSIS — F32A Depression, unspecified: Secondary | ICD-10-CM | POA: Insufficient documentation

## 2022-04-02 DIAGNOSIS — T50902A Poisoning by unspecified drugs, medicaments and biological substances, intentional self-harm, initial encounter: Secondary | ICD-10-CM | POA: Diagnosis not present

## 2022-04-02 DIAGNOSIS — T887XXA Unspecified adverse effect of drug or medicament, initial encounter: Secondary | ICD-10-CM | POA: Diagnosis not present

## 2022-04-02 DIAGNOSIS — I959 Hypotension, unspecified: Secondary | ICD-10-CM | POA: Diagnosis not present

## 2022-04-02 DIAGNOSIS — Z1152 Encounter for screening for COVID-19: Secondary | ICD-10-CM | POA: Diagnosis not present

## 2022-04-02 DIAGNOSIS — R569 Unspecified convulsions: Secondary | ICD-10-CM | POA: Diagnosis not present

## 2022-04-02 DIAGNOSIS — I10 Essential (primary) hypertension: Secondary | ICD-10-CM | POA: Diagnosis not present

## 2022-04-02 DIAGNOSIS — T50901A Poisoning by unspecified drugs, medicaments and biological substances, accidental (unintentional), initial encounter: Secondary | ICD-10-CM | POA: Diagnosis present

## 2022-04-02 LAB — CBC WITH DIFFERENTIAL/PLATELET
Abs Immature Granulocytes: 0.02 10*3/uL (ref 0.00–0.07)
Basophils Absolute: 0 10*3/uL (ref 0.0–0.1)
Basophils Relative: 0 %
Eosinophils Absolute: 0 10*3/uL (ref 0.0–0.5)
Eosinophils Relative: 1 %
HCT: 40.9 % (ref 39.0–52.0)
Hemoglobin: 13.7 g/dL (ref 13.0–17.0)
Immature Granulocytes: 0 %
Lymphocytes Relative: 8 %
Lymphs Abs: 0.4 10*3/uL — ABNORMAL LOW (ref 0.7–4.0)
MCH: 29.7 pg (ref 26.0–34.0)
MCHC: 33.5 g/dL (ref 30.0–36.0)
MCV: 88.5 fL (ref 80.0–100.0)
Monocytes Absolute: 0.5 10*3/uL (ref 0.1–1.0)
Monocytes Relative: 10 %
Neutro Abs: 4.1 10*3/uL (ref 1.7–7.7)
Neutrophils Relative %: 81 %
Platelets: 100 10*3/uL — ABNORMAL LOW (ref 150–400)
RBC: 4.62 MIL/uL (ref 4.22–5.81)
RDW: 14.8 % (ref 11.5–15.5)
WBC: 5.1 10*3/uL (ref 4.0–10.5)
nRBC: 0 % (ref 0.0–0.2)

## 2022-04-02 LAB — COMPREHENSIVE METABOLIC PANEL
ALT: 22 U/L (ref 0–44)
AST: 20 U/L (ref 15–41)
Albumin: 3.8 g/dL (ref 3.5–5.0)
Alkaline Phosphatase: 59 U/L (ref 38–126)
Anion gap: 10 (ref 5–15)
BUN: 15 mg/dL (ref 8–23)
CO2: 25 mmol/L (ref 22–32)
Calcium: 8.9 mg/dL (ref 8.9–10.3)
Chloride: 103 mmol/L (ref 98–111)
Creatinine, Ser: 1.21 mg/dL (ref 0.61–1.24)
GFR, Estimated: >60 mL/min (ref >60)
Glucose, Bld: 160 mg/dL — ABNORMAL HIGH (ref 70–99)
Potassium: 3.7 mmol/L (ref 3.5–5.1)
Sodium: 138 mmol/L (ref 135–145)
Total Bilirubin: 1.2 mg/dL (ref 0.3–1.2)
Total Protein: 6.2 g/dL — ABNORMAL LOW (ref 6.5–8.1)

## 2022-04-02 LAB — RESP PANEL BY RT-PCR (RSV, FLU A&B, COVID)  RVPGX2
Influenza A by PCR: NEGATIVE
Influenza B by PCR: NEGATIVE
Resp Syncytial Virus by PCR: NEGATIVE
SARS Coronavirus 2 by RT PCR: NEGATIVE

## 2022-04-02 LAB — RAPID URINE DRUG SCREEN, HOSP PERFORMED
Amphetamines: NOT DETECTED
Barbiturates: NOT DETECTED
Benzodiazepines: NOT DETECTED
Cocaine: NOT DETECTED
Opiates: NOT DETECTED
Tetrahydrocannabinol: NOT DETECTED

## 2022-04-02 LAB — ETHANOL: Alcohol, Ethyl (B): <10 mg/dL (ref <10)

## 2022-04-02 LAB — SALICYLATE LEVEL: Salicylate Lvl: <7 mg/dL — ABNORMAL LOW (ref 7.0–30.0)

## 2022-04-02 LAB — ACETAMINOPHEN LEVEL: Acetaminophen (Tylenol), Serum: <10 ug/mL — ABNORMAL LOW (ref 10–30)

## 2022-04-02 MED ORDER — ACETAMINOPHEN 325 MG PO TABS: 650.0000 mg | ORAL_TABLET | Freq: Once | ORAL | Status: DC

## 2022-04-02 MED ORDER — ATORVASTATIN CALCIUM 40 MG PO TABS
40.0000 mg | ORAL_TABLET | Freq: Every day | ORAL | Status: DC
  Administered 2022-04-02 – 2022-04-03 (×2): 40 mg via ORAL
  Filled 2022-04-02 (×2): qty 1

## 2022-04-02 MED ORDER — SERTRALINE HCL 50 MG PO TABS
150.0000 mg | ORAL_TABLET | Freq: Every day | ORAL | Status: DC
  Administered 2022-04-02 – 2022-04-03 (×2): 150 mg via ORAL
  Filled 2022-04-02 (×2): qty 3

## 2022-04-02 MED ORDER — ALLOPURINOL 300 MG PO TABS
150.0000 mg | ORAL_TABLET | Freq: Every day | ORAL | Status: DC
  Administered 2022-04-02 – 2022-04-03 (×2): 150 mg via ORAL
  Filled 2022-04-02 (×2): qty 0.5

## 2022-04-02 MED ORDER — RIVAROXABAN 20 MG PO TABS
20.0000 mg | ORAL_TABLET | Freq: Every day | ORAL | Status: DC
  Administered 2022-04-02 – 2022-04-03 (×2): 20 mg via ORAL
  Filled 2022-04-02 (×2): qty 1

## 2022-04-02 MED ORDER — METOPROLOL SUCCINATE ER 50 MG PO TB24
50.0000 mg | ORAL_TABLET | Freq: Every day | ORAL | Status: DC
  Administered 2022-04-02 – 2022-04-03 (×2): 50 mg via ORAL
  Filled 2022-04-02 (×2): qty 1

## 2022-04-02 MED ORDER — LOSARTAN POTASSIUM 50 MG PO TABS
50.0000 mg | ORAL_TABLET | Freq: Every day | ORAL | Status: DC
  Administered 2022-04-02 – 2022-04-03 (×2): 50 mg via ORAL
  Filled 2022-04-02 (×2): qty 1

## 2022-04-02 MED ORDER — ARIPIPRAZOLE 2 MG PO TABS
2.0000 mg | ORAL_TABLET | Freq: Every day | ORAL | Status: DC
  Administered 2022-04-02 – 2022-04-03 (×2): 2 mg via ORAL
  Filled 2022-04-02 (×2): qty 1

## 2022-04-02 MED ORDER — RENA-VITE PO TABS
1.0000 | ORAL_TABLET | Freq: Every day | ORAL | Status: DC
  Administered 2022-04-02 – 2022-04-03 (×2): 1 via ORAL
  Filled 2022-04-02 (×2): qty 1

## 2022-04-02 MED ORDER — ACETAMINOPHEN 500 MG PO TABS: 1000.0000 mg | ORAL_TABLET | Freq: Four times a day (QID) | ORAL | Status: DC | PRN

## 2022-04-02 MED ORDER — SODIUM CHLORIDE 0.9 % IV BOLUS
1000.0000 mL | Freq: Once | INTRAVENOUS | Status: AC
  Administered 2022-04-02: 1000 mL via INTRAVENOUS

## 2022-04-02 MED ORDER — MIRTAZAPINE 7.5 MG PO TABS
15.0000 mg | ORAL_TABLET | Freq: Every day | ORAL | Status: DC
  Administered 2022-04-02 – 2022-04-03 (×2): 15 mg via ORAL
  Filled 2022-04-02 (×2): qty 2

## 2022-04-02 NOTE — ED Notes (Signed)
Received patient from home by medic. Patient arrived AAOX4, NAD. Respirations even and unlabored. MAE. Per medic patient intentionally overdosed on his Seroquel. Patient Endorses feeling sad this morning when he woke up which is why he took the pills. Admits to SI. Safety precautions in place. EDP at bedside for eval. ECG monitoring and continuous SP02 in place.

## 2022-04-02 NOTE — ED Notes (Signed)
Per poison control, patient should be monitored for 4-8 hrs.

## 2022-04-02 NOTE — Consult Note (Signed)
[facility] ED ASSESSMENT   Reason for Consult:  suicidal Referring Physician:  Dr. Lovette Cliche Patient Identification: Bruce Yoder MRN:  [id number] ED Chief Complaint: Overdose  Diagnosis:  Principal Problem:   Overdose Active Problems:   Suicidal ideation   ED Assessment Time Calculation: Start Time: 1600 Stop Time: 1645 Total Time in Minutes (Assessment Completion): 45     Subjective:  Bruce Yoder, 77 y.o., male patient seen face to face by this provider, consulted with Dr. Dwyane Dee; and chart reviewed on 04/02/22.  On evaluation Bruce Yoder reports that he feels life has got the better of him.  Said that he has been separated from his wife for a year, they are actually getting ready to start divorce proceedings, says that he does not miss being married to her but missed the life that they used to have.  Says that he left his wife and moved to [place] so he could play in a [place] with friends and after 9 months realized he did not like it and realized he missed his old life and he has become sad about life.  Says that he lives by himself but very close to his daughter's home, Bruce Yoder she is his only child.  Says that he has been going to the [facility] but [facility], and today he had an appointment and his daughter picks him up and takes him, said he started thinking about life today and took an unknown amount of his Seroquel that was prescribed by his PCP for sleep.  Patient says sometimes he feels like he cannot go on with life and is being a burden to his only child and a lot of the times he wants to die.  Patient said he has a lot of stress of the divorce settlement, their taxes, and living by himself.  Patient says that his appetite and sleep are poor due to anxiety and stress.  Patient says that he has been hospitalized a few times in his life says that he was just an [facility] [place] 2 weeks ago and on New Year's he was at [facility] for depression.  Patient states that he takes  sertraline, Abilify, and Remeron for his depression and anxiety.  Patient denies HI/AVH, but passively endorses SI, says "I do not want to say too much I may did myself into a deeper hole "patient said "seemed like to make things worse, my daughter was coming to pick me up to take me to [facility], and now she has to bring me here and she is worried about me and she supposed to go out of town to [place] for the weekend."Patient denies using illicit drugs or alcohol.  Patient says that he enjoys playing [place] with his friends, and enjoys playing golf.  During evaluation Bruce Yoder is slightly drowsy, laying in his bed in no acute distress.  He is oriented x 3 home, cooperative, and attentive.  His mood is sad with congruent/flat affect.  His speech is a little slurred at times his behavior is appropriate. Objectively there is no evidence of psychosis/mania or delusional thinking.  Patient is able to converse coherently, goal directed thoughts, no distractibility, or pre-occupation. He denies homicidal ideation, psychosis, and paranoia, but continues to endorse SI.  Patient says he does not have access to any guns.  Patient daughter Bruce Yoder stepped out of the room when I talked with patient, but came back in the room at the end of session with  patient's permission, and stated that she started seeing changes and patient more so about 2 months ago, when patient attempted to contact his ex-wife which is not her mother, the ex-wife told patient to not contact her anymore and stay away because he walked out on her.  Patient says that he was not trying to start a relationship with her he just wanted to discuss with her them having better communication.  Patient says life has just become too much.  Patient's daughter stated that patient is compliant with his medications, that Seroquel is not usually wanting his regular medications, it was prescribed by his PCP a while ago to help for sleep.  Bruce Yoder  stated that she just wanted patient to have a consistent psychiatrist and provider because patient's medications have been written by several providers and they just want consistency.  Patient daughter stated that she did not have consistent stay at [facility], she did not particularly like how their inpatient was ran, and if [facility] was a choice for her father, she would rather him come home.  Provider discussed with them inpatient process, and also informed him of the behavioral health urgent care in order to help with community resources, medication management, and therapy.   Past Psychiatric History: Depression and anxiety.  Inpatient admission to psychiatric facilities  Risk to Self or Others: Is the patient at risk to self? Yes Has the patient been a risk to self in the past 6 months? No Has the patient been a risk to self within the distant past? No Is the patient a risk to others? No Has the patient been a risk to others in the past 6 months? No Has the patient been a risk to others within the distant past? No  [place] Scale:  [facility] from 04/02/2022 in [facility] Emergency Department at [facility] Admission (Discharged) from 10/11/2020 in [facility] CATH LAB Admission (Discharged) from 07/04/2020 in [facility] High Risk No Risk No Risk       AIMS:  , , ,  ,   ASAM:    Substance Abuse:     Past Medical History:  Past Medical History:  Diagnosis Date   Atrial fibrillation ([place])    a. diagnosed on 10/22/2017; b. successful DCCV on 12/14/2017; Paroxysmal Persistent   Basal cell carcinoma (BCC) of back    Dr. Elvera Lennox   CAD (coronary artery disease)    a. LHC 01/31/2018: OM2 55-60%, FFR 0.93. pLAD 25%   CKD (chronic kidney disease) stage 3, GFR 30-59 ml/min (HCC)    Unilateral kidney   Dilated cardiomyopathy -related to A. fib RVR; resolved 10/2017   Thought to be related  to A. fib RVR: a) echo 11/03/2017 showed LVEF of 35-40% with severe LVH --> follow-up echo January 2020 showed resolution with EF 55 to 60%.  Normal filling pressures.  Read as mild as opposed to severe LVH   Diverticulosis of colon    With polyps (Dr. Amedeo Plenty March 2017, followed by Dr. Glennon [place])   Erectile dysfunction    Essential hypertension 2018   Gout    OSA on CPAP    [facility]   Status post nephrectomy 1965   Thrombocytopenia, unspecified ([place])    Platelet clumping (pseudothrombocytopenia)    Past Surgical History:  Procedure Laterality Date   APPENDECTOMY     During childhood   ATRIAL FIBRILLATION ABLATION N/A 10/11/2020  Procedure: ATRIAL FIBRILLATION ABLATION;  Surgeon: Constance Haw, MD;  Location: [facility] CV LAB;  Service: Cardiovascular;  Laterality: N/A;   CARDIOVERSION N/A 12/14/2017   Procedure: CARDIOVERSION;  Surgeon: Sueanne Margarita, MD;  Location: [facility] ENDOSCOPY;  Service: Cardiovascular;  Laterality: N/A;   CARDIOVERSION N/A 07/04/2020   Procedure: CARDIOVERSION;  Surgeon: Sueanne Margarita, MD;  Location: [facility];  Service: Cardiovascular;  Laterality: N/A;   CYST EXCISION     face   INTRAVASCULAR PRESSURE WIRE/FFR STUDY N/A 01/31/2018   Procedure: INTRAVASCULAR PRESSURE WIRE/FFR STUDY;  Surgeon: Leonie Man, MD;  Location: [facility] CV LAB;  Service: Cardiovascular;  Laterality: N/A;   LEFT HEART CATH AND CORONARY ANGIOGRAPHY N/A 01/31/2018   Procedure: LEFT HEART CATH AND CORONARY ANGIOGRAPHY;  Surgeon: Leonie Man, MD;  Location: [facility] CV LAB;  Service: Cardiovascular: Single-vessel disease with roughly 60% lesion in major OM branch.  FFR negative.   NEPHRECTOMY Right 1965   TRANSTHORACIC ECHOCARDIOGRAM  10/2017    EF 35 -40% with severe LVH.  Diffuse global hypokinesis.  Mild aortic and mitral regurgitation.  Mild left and right atrial dilation.   TRANSTHORACIC ECHOCARDIOGRAM  02/2018   EF 55-60%, normal  filling pressure. Mod LA dilation.  Mild to moderate TR. -> resolution of cardiomyopathy   VASECTOMY  1989   Family History:  Family History  Problem Relation Age of Onset   Colon cancer Mother    Hypertension Father    Seizures Father    Stroke Father    Dementia Paternal Grandmother    Heart attack Paternal Grandfather      Social History:  Social History   Substance and Sexual Activity  Alcohol Use Not Currently   Alcohol/week: 2.0 standard drinks of alcohol   Types: 1 Glasses of wine, 1 Cans of beer per week     Social History   Substance and Sexual Activity  Drug Use Never    Social History   Socioeconomic History   Marital status: Married    Spouse name: Not on file   Number of children: Not on file   Years of education: Not on file   Highest education level: Not on file  Occupational History   Not on file  Tobacco Use   Smoking status: Never   Smokeless tobacco: Never  Vaping Use   Vaping Use: Never used  Substance and Sexual Activity   Alcohol use: Not Currently    Alcohol/week: 2.0 standard drinks of alcohol    Types: 1 Glasses of wine, 1 Cans of beer per week   Drug use: Never   Sexual activity: Not on file  Other Topics Concern   Not on file  Social History Narrative   He is married now for 4 years (remarried).  He has 1 child (presumably from previous marriage -75 years old).   He lives with his wife.  He is an avid Chief Executive Officer.   He is a retired Producer, television/film/video for [employer].  (He has a BA in American studies at [employer].)   Never smoked.  Drinks 5-7 alcoholic beverages a week usually beer or hard cider.  May be occasionally a mixed drink.   He is quite active exercise at least 4 days a week for 30 minutes at a time.       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  He enjoys walking on both  treadmill and in the community.  He plays golf  routinely.   Social Determinants of Health   Financial Resource Strain: Not on file  Food Insecurity: Not on file  Transportation Needs: Not on file  Physical Activity: Not on file  Stress: Not on file  Social Connections: Not on file      Allergies:  No Known Allergies  Labs:  Results for orders placed or performed during the hospital encounter of 04/02/22 (from the past 48 hour(s))  Resp panel by RT-PCR (RSV, Flu A&B, Covid) Anterior Nasal Swab     Status: None   Collection Time: 04/02/22  9:23 AM   Specimen: Anterior Nasal Swab  Result Value Ref Range   SARS Coronavirus 2 by RT PCR NEGATIVE NEGATIVE    Comment: (NOTE) SARS-CoV-2 target nucleic acids are NOT DETECTED.  The SARS-CoV-2 RNA is generally detectable in upper respiratory specimens during the acute phase of infection. The lowest concentration of SARS-CoV-2 viral copies this assay can detect is 138 copies/mL. A negative result does not preclude SARS-Cov-2 infection and should not be used as the sole basis for treatment or other patient management decisions. A negative result may occur with  improper specimen collection/handling, submission of specimen other than nasopharyngeal swab, presence of viral mutation(s) within the areas targeted by this assay, and inadequate number of viral copies(<138 copies/mL). A negative result must be combined with clinical observations, patient history, and epidemiological information. The expected result is Negative.  Fact Sheet for Patients:  EntrepreneurPulse.com.au  Fact Sheet for Healthcare Providers:  IncredibleEmployment.be  This test is no t yet approved or cleared by the [place] FDA and  has been authorized for detection and/or diagnosis of SARS-CoV-2 by FDA under an Emergency Use Authorization (EUA). This EUA will remain  in effect (meaning this test can be used) for the duration of the COVID-19 declaration under Section  564(b)(1) of the Act, 21 U.S.C.section 360bbb-3(b)(1), unless the authorization is terminated  or revoked sooner.       Influenza A by PCR NEGATIVE NEGATIVE   Influenza B by PCR NEGATIVE NEGATIVE    Comment: (NOTE) The Xpert Xpress SARS-CoV-2/FLU/RSV plus assay is intended as an aid in the diagnosis of influenza from Nasopharyngeal swab specimens and should not be used as a sole basis for treatment. Nasal washings and aspirates are unacceptable for Xpert Xpress SARS-CoV-2/FLU/RSV testing.  Fact Sheet for Patients: EntrepreneurPulse.com.au  Fact Sheet for Healthcare Providers: IncredibleEmployment.be  This test is not yet approved or cleared by the [place] FDA and has been authorized for detection and/or diagnosis of SARS-CoV-2 by FDA under an Emergency Use Authorization (EUA). This EUA will remain in effect (meaning this test can be used) for the duration of the COVID-19 declaration under Section 564(b)(1) of the Act, 21 U.S.C. section 360bbb-3(b)(1), unless the authorization is terminated or revoked.     Resp Syncytial Virus by PCR NEGATIVE NEGATIVE    Comment: (NOTE) Fact Sheet for Patients: EntrepreneurPulse.com.au  Fact Sheet for Healthcare Providers: IncredibleEmployment.be  This test is not yet approved or cleared by the [place] FDA and has been authorized for detection and/or diagnosis of SARS-CoV-2 by FDA under an Emergency Use Authorization (EUA). This EUA will remain in effect (meaning this test can be used) for the duration of the COVID-19 declaration under Section 564(b)(1) of the Act, 21 U.S.C. section 360bbb-3(b)(1), unless the authorization is terminated or revoked.  Performed at [facility], [place] [address], [place], [place] [address]  CBC with Differential     Status: Abnormal   Collection Time: 04/02/22 10:00 AM  Result Value Ref Range   WBC 5.1  4.0 - 10.5 K/uL   RBC 4.62 4.22 - 5.81 MIL/uL   Hemoglobin 13.7 13.0 - 17.0 g/dL   HCT 40.9 39.0 - 52.0 %   MCV 88.5 80.0 - 100.0 fL   MCH 29.7 26.0 - 34.0 pg   MCHC 33.5 30.0 - 36.0 g/dL   RDW 14.8 11.5 - 15.5 %   Platelets 100 (L) 150 - 400 K/uL    Comment: Immature Platelet Fraction may be clinically indicated, consider ordering this additional test [id number]    nRBC 0.0 0.0 - 0.2 %   Neutrophils Relative % 81 %   Neutro Abs 4.1 1.7 - 7.7 K/uL   Lymphocytes Relative 8 %   Lymphs Abs 0.4 (L) 0.7 - 4.0 K/uL   Monocytes Relative 10 %   Monocytes Absolute 0.5 0.1 - 1.0 K/uL   Eosinophils Relative 1 %   Eosinophils Absolute 0.0 0.0 - 0.5 K/uL   Basophils Relative 0 %   Basophils Absolute 0.0 0.0 - 0.1 K/uL   Immature Granulocytes 0 %   Abs Immature Granulocytes 0.02 0.00 - 0.07 K/uL    Comment: Performed at [facility], [place] [address] [place], [place] [address]  Comprehensive metabolic panel     Status: Abnormal   Collection Time: 04/02/22 10:00 AM  Result Value Ref Range   Sodium 138 135 - 145 mmol/L   Potassium 3.7 3.5 - 5.1 mmol/L   Chloride 103 98 - 111 mmol/L   CO2 25 22 - 32 mmol/L   Glucose, Bld 160 (H) 70 - 99 mg/dL    Comment: Glucose reference range applies only to samples taken after fasting for at least 8 hours.   BUN 15 8 - 23 mg/dL   Creatinine, Ser 1.21 0.61 - 1.24 mg/dL   Calcium 8.9 8.9 - 10.3 mg/dL   Total Protein 6.2 (L) 6.5 - 8.1 g/dL   Albumin 3.8 3.5 - 5.0 g/dL   AST 20 15 - 41 U/L   ALT 22 0 - 44 U/L   Alkaline Phosphatase 59 38 - 126 U/L   Total Bilirubin 1.2 0.3 - 1.2 mg/dL   GFR, Estimated >60 >60 mL/min    Comment: (NOTE) Calculated using the CKD-EPI Creatinine Equation (2021)    Anion gap 10 5 - 15    Comment: Performed at [facility], [place] [address], [place], [place] [address]  Ethanol     Status: None   Collection Time: 04/02/22 10:00 AM  Result Value Ref Range   Alcohol, Ethyl (B) <10 <10 mg/dL     Comment: (NOTE) Lowest detectable limit for serum alcohol is 10 mg/dL.  For medical purposes only. Performed at [facility], [place] [address], [place], [place] [id number]   Salicylate level     Status: Abnormal   Collection Time: 04/02/22 10:00 AM  Result Value Ref Range   Salicylate Lvl [id number] (L) 7.0 - 30.0 mg/dL    Comment: Performed at [facility], [place] [address], [place], [place] [address]  Rapid urine drug screen (hospital performed)     Status: None   Collection Time: 04/02/22  1:55 PM  Result Value Ref Range   Opiates NONE DETECTED NONE DETECTED   Cocaine NONE DETECTED NONE DETECTED   Benzodiazepines NONE DETECTED NONE DETECTED   Amphetamines NONE DETECTED NONE DETECTED  Tetrahydrocannabinol NONE DETECTED NONE DETECTED   Barbiturates NONE DETECTED NONE DETECTED    Comment: (NOTE) DRUG SCREEN FOR MEDICAL PURPOSES ONLY.  IF CONFIRMATION IS NEEDED FOR ANY PURPOSE, NOTIFY LAB WITHIN 5 DAYS.  LOWEST DETECTABLE LIMITS FOR URINE DRUG SCREEN Drug Class                     Cutoff (ng/mL) Amphetamine and metabolites    1000 Barbiturate and metabolites    200 Benzodiazepine                 200 Opiates and metabolites        300 Cocaine and metabolites        300 THC                            50 Performed at [facility], [place] [address], [place], [place] [address]     Current Facility-Administered Medications  Medication Dose Route Frequency Provider Last Rate Last Admin   acetaminophen (TYLENOL) tablet 1,000 mg  1,000 mg Oral Q6H PRN Milton Ferguson, MD       acetaminophen (TYLENOL) tablet 650 mg  650 mg Oral Once Milton Ferguson, MD       allopurinol (ZYLOPRIM) tablet 150 mg  150 mg Oral Daily Milton Ferguson, MD   150 mg at 04/02/22 1620   ARIPiprazole (ABILIFY) tablet 2 mg  2 mg Oral Daily Milton Ferguson, MD   2 mg at 04/02/22 1620   atorvastatin (LIPITOR) tablet 40 mg  40 mg Oral Daily Milton Ferguson, MD   40 mg  at 04/02/22 1620   losartan (COZAAR) tablet 50 mg  50 mg Oral Daily Milton Ferguson, MD   50 mg at 04/02/22 1620   metoprolol succinate (TOPROL-XL) 24 hr tablet 50 mg  50 mg Oral Daily Milton Ferguson, MD   50 mg at 04/02/22 1620   mirtazapine (REMERON) tablet 15 mg  15 mg Oral QHS Milton Ferguson, MD       multivitamin (RENA-VIT) tablet 1 tablet  1 tablet Oral QHS Milton Ferguson, MD       rivaroxaban Alveda Reasons) tablet 20 mg  20 mg Oral Q supper Milton Ferguson, MD   20 mg at 04/02/22 1620   sertraline (ZOLOFT) tablet 150 mg  150 mg Oral Daily Milton Ferguson, MD   150 mg at 04/02/22 1620   Current Outpatient Medications  Medication Sig Dispense Refill   acetaminophen (TYLENOL) 500 MG tablet Take 1,000 mg by mouth every 6 (six) hours as needed for moderate pain.     allopurinol (ZYLOPRIM) 300 MG tablet Take 150 mg by mouth daily.      ARIPiprazole (ABILIFY) 2 MG tablet Take 2 mg by mouth daily.     atorvastatin (LIPITOR) 40 MG tablet Take 1 tablet (40 mg total) by mouth daily. 90 tablet 3   b complex-vitamin c-folic acid (NEPHRO-VITE) 0.8 MG TABS tablet Take 1 tablet by mouth 2 (two) times daily.     clonazePAM (KLONOPIN) 0.5 MG tablet Take 0.5 mg by mouth 2 (two) times daily as needed for anxiety.     hydrOXYzine (ATARAX) 25 MG tablet Take 25 mg by mouth 2 (two) times daily as needed for anxiety.     loratadine (CLARITIN) 10 MG tablet Take 10 mg by mouth daily as needed for allergies.     losartan (COZAAR) 50 MG tablet Take 1 tablet (50 mg total) by mouth  daily. 90 tablet 3   metoprolol succinate (TOPROL-XL) 50 MG 24 hr tablet TAKE 1 TABLET BY MOUTH ONCE DAILY WITH MEALS 90 tablet 3   mirtazapine (REMERON) 15 MG tablet Take 15 mg by mouth at bedtime.     QUEtiapine (SEROQUEL) 50 MG tablet Take 50 mg by mouth at bedtime.     sertraline (ZOLOFT) 50 MG tablet Take 150 mg by mouth daily.     XARELTO 20 MG TABS tablet TAKE 1 TABLET DAILY WITH SUPPER. (Patient taking differently: Take 20 mg by mouth  daily with supper.) 90 tablet 1    Musculoskeletal: Strength & Muscle Tone: within normal limits and decreased Gait & Station: normal Patient leans: N/A   Psychiatric Specialty Exam: Presentation  General Appearance:  Appropriate for Environment  Eye Contact: Fair  Speech: Slurred  Speech Volume: Normal  Handedness: Right   Mood and Affect  Mood: Hopeless  Affect: Depressed; Flat   Thought Process  Thought Processes: Coherent  Descriptions of Associations:Intact  Orientation:Full (Time, Place and Person)  Thought Content:WDL  History of Schizophrenia/Schizoaffective disorder:No data recorded Duration of Psychotic Symptoms:No data recorded Hallucinations:Hallucinations: None  Ideas of Reference:None  Suicidal Thoughts:Suicidal Thoughts: Yes, Active  Homicidal Thoughts:Homicidal Thoughts: No   Sensorium  Memory: Immediate Good; Remote Good; Recent Good  Judgment: Fair  Insight: Good   Executive Functions  Concentration: Good  Attention Span: Good  Recall: Good  Fund of Knowledge: Good  Language: Good   Psychomotor Activity  Psychomotor Activity: Psychomotor Activity: Normal   Assets  Assets: Communication Skills; Financial Resources/Insurance; Housing    Sleep  Sleep: Sleep: Fair   Physical Exam: Physical Exam Vitals and nursing note reviewed.  Eyes:     Pupils: Pupils are equal, round, and reactive to light.  Cardiovascular:     Rate and Rhythm: Normal rate.  Abdominal:     General: Abdomen is flat.  Neurological:     Mental Status: He is alert.  Psychiatric:        Attention and Perception: Attention normal.        Mood and Affect: Mood is depressed.        Speech: Speech is slurred.        Behavior: Behavior is slowed.        Thought Content: Thought content includes suicidal ideation. Thought content includes suicidal plan.        Cognition and Memory: Memory normal.        Judgment: Judgment is  inappropriate.    Review of Systems  Constitutional: Negative.   HENT: Negative.    Respiratory: Negative.    Musculoskeletal: Negative.   Skin: Negative.   Psychiatric/Behavioral:  Positive for depression and suicidal ideas.    Blood pressure 118/73, pulse 69, temperature 97.6 F (36.4 C), temperature source Oral, resp. rate 16, height 5' 10"$  (1.778 m), weight 103.4 kg, SpO2 98 %. Body mass index is 32.71 kg/m.   Medical Decision Making: Recommend gero- psychiatric inpatient treatment.  EDP has restarted patient's home medications   Disposition: Recommend psychiatric Inpatient admission when medically cleared.  Michaele Offer, PMHNP 04/02/2022 5:01 PM

## 2022-04-02 NOTE — ED Notes (Signed)
Patient to room 31. Patient oriented to unit and room. Patient cooperative.  Patient able to ambulate

## 2022-04-02 NOTE — ED Provider Notes (Signed)
[place] EMERGENCY DEPARTMENT AT [facility] Provider Note   CSN: [id number] Arrival date & time: 04/02/22  0626     History  Chief Complaint  Patient presents with   Drug Overdose    Intentional drug overdose    Bruce Yoder is a 77 y.o. male.  Patient has a history of coronary artery disease.  He also has a history of depression.  He states around 8 AM he took approximately 30 Seroquel tablets that were 50 mg.  They were not the extended relief.  I spoke with poison control and they stated the patient needs to be observed for approximately 4 hours and supportive care should be given   The history is provided by the patient, the EMS personnel and a relative. No language interpreter was used.  Drug Overdose This is a new problem. The current episode started 1 to 2 hours ago. The problem occurs rarely. The problem has been resolved. Pertinent negatives include no chest pain, no abdominal pain and no headaches. Nothing aggravates the symptoms. Nothing relieves the symptoms. He has tried nothing for the symptoms. The treatment provided no relief.       Home Medications Prior to Admission medications   Medication Sig Start Date End Date Taking? Authorizing Provider  acetaminophen (TYLENOL) 500 MG tablet Take 1,000 mg by mouth every 6 (six) hours as needed for moderate pain.    [provider]  allopurinol (ZYLOPRIM) 300 MG tablet Take 150 mg by mouth daily.  10/22/15   [provider]  atorvastatin (LIPITOR) 40 MG tablet Take 1 tablet (40 mg total) by mouth daily. 08/29/20   Leonie Man, MD  b complex-vitamin c-folic acid (NEPHRO-VITE) 0.8 MG TABS tablet Take 1 tablet by mouth at bedtime.    [provider]  loratadine (CLARITIN) 10 MG tablet Take 10 mg by mouth daily as needed for allergies.    [provider]  losartan (COZAAR) 50 MG tablet Take 1 tablet (50 mg total) by mouth daily. 08/29/20 01/15/21  Leonie Man, MD   metoprolol succinate (TOPROL-XL) 50 MG 24 hr tablet TAKE 1 TABLET BY MOUTH ONCE DAILY WITH MEALS 08/29/20   Leonie Man, MD  sertraline (ZOLOFT) 50 MG tablet Take 50 mg by mouth daily.  10/22/15   [provider]  XARELTO 20 MG TABS tablet TAKE 1 TABLET DAILY WITH SUPPER. 02/28/21   Camnitz, Ocie Doyne, MD      Allergies    Patient has no known allergies.    Review of Systems   Review of Systems  Constitutional:  Negative for appetite change and fatigue.  HENT:  Negative for congestion, ear discharge and sinus pressure.   Eyes:  Negative for discharge.  Respiratory:  Negative for cough.   Cardiovascular:  Negative for chest pain.  Gastrointestinal:  Negative for abdominal pain and diarrhea.  Genitourinary:  Negative for frequency and hematuria.  Musculoskeletal:  Negative for back pain.  Skin:  Negative for rash.  Neurological:  Negative for seizures and headaches.  Psychiatric/Behavioral:  Positive for dysphoric mood. Negative for hallucinations.     Physical Exam Updated Vital Signs BP 132/86 (BP Location: Right Arm)   Pulse 74   Temp 98.1 F (36.7 C)   Resp 16   Ht '5\' 10"'$  (1.778 m)   Wt 103.4 kg   SpO2 99%   BMI 32.71 kg/m  Physical Exam Vitals and nursing note reviewed.  Constitutional:      Appearance: He  is well-developed.     Comments: Lethargic with slurred speech  HENT:     Head: Normocephalic.  Eyes:     General: No scleral icterus.    Conjunctiva/sclera: Conjunctivae normal.  Neck:     Thyroid: No thyromegaly.  Cardiovascular:     Rate and Rhythm: Normal rate and regular rhythm.     Heart sounds: No murmur heard.    No friction rub. No gallop.  Pulmonary:     Breath sounds: No stridor. No wheezing or rales.  Chest:     Chest wall: No tenderness.  Abdominal:     General: There is no distension.     Tenderness: There is no abdominal tenderness. There is no rebound.  Musculoskeletal:        General: Normal range of motion.     Cervical  back: Neck supple.  Lymphadenopathy:     Cervical: No cervical adenopathy.  Skin:    Findings: No erythema or rash.  Neurological:     Motor: No abnormal muscle tone.     Coordination: Coordination normal.     Comments: Slurred speech  Psychiatric:     Comments: Depression     ED Results / Procedures / Treatments   Labs (all labs ordered are listed, but only abnormal results are displayed) Labs Reviewed  CBC WITH DIFFERENTIAL/PLATELET - Abnormal; Notable for the following components:      Result Value   Platelets 100 (*)    Lymphs Abs 0.4 (*)    All other components within normal limits  COMPREHENSIVE METABOLIC PANEL - Abnormal; Notable for the following components:   Glucose, Bld 160 (*)    Total Protein 6.2 (*)    All other components within normal limits  SALICYLATE LEVEL - Abnormal; Notable for the following components:   Salicylate Lvl <9.4 (*)    All other components within normal limits  RESP PANEL BY RT-PCR (RSV, FLU A&B, COVID)  RVPGX2  ETHANOL  RAPID URINE DRUG SCREEN, HOSP PERFORMED    EKG None  Radiology No results found.  Procedures Procedures    Medications Ordered in ED Medications  acetaminophen (TYLENOL) tablet 650 mg (0 mg Oral Hold 04/02/22 0925)  sodium chloride 0.9 % bolus 1,000 mL (1,000 mLs Intravenous New Bag/Given 04/02/22 7096)    ED Course/ Medical Decision Making/ A&P  Suicide attempt with Seroquel.  Patient is medically cleared after 5 hours of observation                           Medical Decision Making Amount and/or Complexity of Data Reviewed Labs: ordered. ECG/medicine tests: ordered.  Risk OTC drugs.  This patient presents to the ED for concern of suicide attempt, this involves an extensive number of treatment options, and is a complaint that carries with it a high risk of complications and morbidity.  The differential diagnosis includes suicidal ideation   Co morbidities that complicate the patient  evaluation  Coronary artery disease and depression   Additional history obtained:  Additional history obtained from daughter External records from outside source obtained and reviewed including hospital records   Lab Tests:  I Ordered, and personally interpreted labs.  The pertinent results include: CBC, chemistries, EtOH all negative   Imaging Studies ordered: No imaging  Cardiac Monitoring: / EKG:  The patient was maintained on a cardiac monitor.  I personally viewed and interpreted the cardiac monitored which showed an underlying rhythm of: Normal sinus rhythm  Consultations Obtained:  I requested consultation with the behavioral health,  and discussed lab and imaging findings as well as pertinent plan - they recommend: Inpatient treatment   Problem List / ED Course / Critical interventions / Medication management  Depression and overdose I ordered medication including normal saline for dehydration Reevaluation of the patient after these medicines showed that the patient improved I have reviewed the patients home medicines and have made adjustments as needed   Social Determinants of Health:  None   Test / Admission - Considered:  None  Patient with suicidal ideations and overdose with Seroquel.  He is medically cleared now and we will get behavioral health involved        Final Clinical Impression(s) / ED Diagnoses Final diagnoses:  None    Rx / DC Orders ED Discharge Orders     None         Milton Ferguson, MD 04/02/22 1355

## 2022-04-02 NOTE — ED Notes (Signed)
Patient lying on stretcher sleeping, NAD. Awaiting further disposition. Daughter leaving bedside, updated on POC.

## 2022-04-02 NOTE — ED Notes (Addendum)
Call from [facility] repeat EKG and tylenol level discussed and pt case with them is now closed.

## 2022-04-02 NOTE — Progress Notes (Signed)
Inpatient Behavioral Health Placement  CSW requested [facility] Mantis, RN to review pt for [facility]. Pt meets inpatient criteria per provider Michaele Offer, PMHNP. CSW and Disposition team will assist and follow with placement.   Benjaman Kindler, MSW, [facility] 04/02/2022 11:46 PM

## 2022-04-03 DIAGNOSIS — T50902A Poisoning by unspecified drugs, medicaments and biological substances, intentional self-harm, initial encounter: Secondary | ICD-10-CM

## 2022-04-03 DIAGNOSIS — T43592A Poisoning by other antipsychotics and neuroleptics, intentional self-harm, initial encounter: Secondary | ICD-10-CM | POA: Diagnosis not present

## 2022-04-03 NOTE — Progress Notes (Addendum)
Pt was accepted [facility] 04/03/22; Bed Assignment L30A-Please have patient sign voluntary consent for admission and send with Safe Transport.  DX:MDD  Pt meets inpatient criteria per Michaele Offer, [place]  Attending Physician will be Dr. Caren Griffins  Report can be called to: [phone]  Pt can arrive: BED IS [place] Team notified: Night [place], RN, Leandro Reasoner, NP, Floyde Parkins, RN, Anette Riedel, RN,Taquita Sheran Spine, [place], [place] 04/03/2022 @ 11:14 PM

## 2022-04-03 NOTE — ED Notes (Signed)
Patient calm and cooperative in his room.

## 2022-04-03 NOTE — ED Notes (Signed)
Patient is awake and alert, calm and cooperative. Patient is currently reading a book in bed. Pt has had no complaints at this time.

## 2022-04-03 NOTE — ED Notes (Signed)
Patient resting comfortably in bed with no signs of acute distress noted. Pt has equal chest rise and fall. Calm and cooperative with staff. No needs expressed at this time.

## 2022-04-03 NOTE — Progress Notes (Signed)
[facility] Psych ED Progress Note  04/03/2022 5:08 PM Bruce Yoder  MRN:  [id number]   Principal Problem: Overdose Diagnosis:  Principal Problem:   Overdose Active Problems:   Suicidal ideation   ED Assessment Time Calculation: Start Time: 1200 Stop Time: 1215 Total Time in Minutes (Assessment Completion): 15   Subjective:  On evaluation today, the patient is sitting up in his bed bed.  He is calm and cooperative during this assessment.  Appropriately dressed for environment.  His eye contact is good.  Speech is much clearer today and coherent, normal pace and normal volume.  He reports his mood is euthymic, and he feels better.  Affect is congruent with mood. Thought process is coherent.  Thought content is slightly tangential.  He denies auditory and visual hallucinations.  No indication that he is responding to internal stimuli during this assessment.  No delusions elicited during this assessment.  He denies suicidal ideations.  He denies homicidal ideations.  Patient asked what would they be able to discharge home, provider informed him due to the severity of what brought him to the ED at this time we are still recommending inpatient psychiatric hospitalization.  Patient said "okay "  Patient feels that, he feels better and he can handle this at home talking with counselors and psychiatrists. Patient's daughter Bruce Yoder is also in the room, patient gave me permission to speak with her, Bruce Yoder states to her father he needs to go to inpatient, because she is not ready or feels safe for him to come back home.  Patient is in agreement and is in no acute distress.   [place] Scale:  [place] ED from 04/02/2022 in [facility] Emergency Department at [facility] Admission (Discharged) from 10/11/2020 in [place] CATH LAB Admission (Discharged) from 07/04/2020 in [place] CATEGORY High Risk No Risk No Risk       Past  Medical History:  Past Medical History:  Diagnosis Date   Atrial fibrillation ([place])    a. diagnosed on 10/22/2017; b. successful DCCV on 12/14/2017; Paroxysmal Persistent   Basal cell carcinoma (BCC) of back    Dr. Elvera Lennox   CAD (coronary artery disease)    a. LHC 01/31/2018: OM2 55-60%, FFR 0.93. pLAD 25%   CKD (chronic kidney disease) stage 3, GFR 30-59 ml/min (HCC)    Unilateral kidney   Dilated cardiomyopathy -related to A. fib RVR; resolved 10/2017   Thought to be related to A. fib RVR: a) echo 11/03/2017 showed LVEF of 35-40% with severe LVH --> follow-up echo January 2020 showed resolution with EF 55 to 60%.  Normal filling pressures.  Read as mild as opposed to severe LVH   Diverticulosis of colon    With polyps (Dr. Amedeo Plenty March 2017, followed by Dr. Glennon Hamilton)   Erectile dysfunction    Essential hypertension 2018   Gout    OSA on CPAP    [facility]   Status post nephrectomy 1965   Thrombocytopenia, unspecified ([place])    Platelet clumping (pseudothrombocytopenia)    Past Surgical History:  Procedure Laterality Date   APPENDECTOMY     During childhood   ATRIAL FIBRILLATION ABLATION N/A 10/11/2020   Procedure: ATRIAL FIBRILLATION ABLATION;  Surgeon: Constance Haw, MD;  Location: [place] [facility];  Service: Cardiovascular;  Laterality: N/A;   CARDIOVERSION N/A 12/14/2017   Procedure: CARDIOVERSION;  Surgeon: Sueanne Margarita, MD;  Location: [place];  Service:  Cardiovascular;  Laterality: N/A;   CARDIOVERSION N/A 07/04/2020   Procedure: CARDIOVERSION;  Surgeon: Sueanne Margarita, MD;  Location: [place];  Service: Cardiovascular;  Laterality: N/A;   CYST EXCISION     face   INTRAVASCULAR PRESSURE WIRE/FFR STUDY N/A 01/31/2018   Procedure: INTRAVASCULAR PRESSURE WIRE/FFR STUDY;  Surgeon: Leonie Man, MD;  Location: [place] [facility];  Service: Cardiovascular;  Laterality: N/A;   LEFT HEART CATH AND CORONARY ANGIOGRAPHY N/A 01/31/2018    Procedure: LEFT HEART CATH AND CORONARY ANGIOGRAPHY;  Surgeon: Leonie Man, MD;  Location: [place] [facility];  Service: Cardiovascular: Single-vessel disease with roughly 60% lesion in major OM branch.  FFR negative.   NEPHRECTOMY Right 1965   TRANSTHORACIC ECHOCARDIOGRAM  10/2017    EF 35 -40% with severe LVH.  Diffuse global hypokinesis.  Mild aortic and mitral regurgitation.  Mild left and right atrial dilation.   TRANSTHORACIC ECHOCARDIOGRAM  02/2018   EF 55-60%, normal filling pressure. Mod LA dilation.  Mild to moderate TR. -> resolution of cardiomyopathy   VASECTOMY  1989   Family History:  Family History  Problem Relation Age of Onset   Colon cancer Mother    Hypertension Father    Seizures Father    Stroke Father    Dementia Paternal Grandmother    Heart attack Paternal Grandfather      Social History:  Social History   Substance and Sexual Activity  Alcohol Use Not Currently   Alcohol/week: 2.0 standard drinks of alcohol   Types: 1 Glasses of wine, 1 Cans of beer per week     Social History   Substance and Sexual Activity  Drug Use Never    Social History   Socioeconomic History   Marital status: Married    Spouse name: Not on file   Number of children: Not on file   Years of education: Not on file   Highest education level: Not on file  Occupational History   Not on file  Tobacco Use   Smoking status: Never   Smokeless tobacco: Never  Vaping Use   Vaping Use: Never used  Substance and Sexual Activity   Alcohol use: Not Currently    Alcohol/week: 2.0 standard drinks of alcohol    Types: 1 Glasses of wine, 1 Cans of beer per week   Drug use: Never   Sexual activity: Not on file  Other Topics Concern   Not on file  Social History Narrative   He is married now for 4 years (remarried).  He has 1 child (presumably from previous marriage -75 years old).   He lives with his wife.  He is an avid Chief Executive Officer.   He is a retired Producer, television/film/video  for [employer].  (He has a BA in American studies at [employer].)   Never smoked.  Drinks 5-7 alcoholic beverages a week usually beer or hard cider.  May be occasionally a mixed drink.   He is quite active exercise at least 4 days a week for 30 minutes at a time.       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  He enjoys walking on both treadmill and in the community.  He plays golf routinely.   Social Determinants of Health   Financial Resource Strain: Not on file  Food Insecurity: Not on file  Transportation Needs: Not on file  Physical Activity: Not on file  Stress: Not on file  Social Connections: Not on file    Sleep: Good  Appetite:  Good  Current Medications: Current Facility-Administered Medications  Medication Dose Route Frequency Provider Last Rate Last Admin   acetaminophen (TYLENOL) tablet 1,000 mg  1,000 mg Oral Q6H PRN Milton Ferguson, MD       acetaminophen (TYLENOL) tablet 650 mg  650 mg Oral Once Milton Ferguson, MD       allopurinol (ZYLOPRIM) tablet 150 mg  150 mg Oral Daily Milton Ferguson, MD   150 mg at 04/03/22 [id number]   ARIPiprazole (ABILIFY) tablet 2 mg  2 mg Oral Daily Milton Ferguson, MD   2 mg at 04/03/22 0942   atorvastatin (LIPITOR) tablet 40 mg  40 mg Oral Daily Milton Ferguson, MD   40 mg at 04/03/22 0942   losartan (COZAAR) tablet 50 mg  50 mg Oral Daily Milton Ferguson, MD   50 mg at 04/03/22 [id number]   metoprolol succinate (TOPROL-XL) 24 hr tablet 50 mg  50 mg Oral Daily Milton Ferguson, MD   50 mg at 04/03/22 0941   mirtazapine (REMERON) tablet 15 mg  15 mg Oral Lauris Chroman, MD   15 mg at 04/02/22 2153   multivitamin (RENA-VIT) tablet 1 tablet  1 tablet Oral Lauris Chroman, MD   1 tablet at 04/02/22 2153   rivaroxaban (XARELTO) tablet 20 mg  20 mg Oral Q supper Milton Ferguson, MD   20 mg at 04/03/22 1651   sertraline (ZOLOFT) tablet 150 mg  150 mg Oral  Daily Milton Ferguson, MD   150 mg at 04/03/22 [id number]   Current Outpatient Medications  Medication Sig Dispense Refill   acetaminophen (TYLENOL) 500 MG tablet Take 1,000 mg by mouth every 6 (six) hours as needed for moderate pain.     allopurinol (ZYLOPRIM) 300 MG tablet Take 150 mg by mouth daily.      ARIPiprazole (ABILIFY) 2 MG tablet Take 2 mg by mouth daily.     atorvastatin (LIPITOR) 40 MG tablet Take 1 tablet (40 mg total) by mouth daily. 90 tablet 3   b complex-vitamin c-folic acid (NEPHRO-VITE) 0.8 MG TABS tablet Take 1 tablet by mouth 2 (two) times daily.     clonazePAM (KLONOPIN) 0.5 MG tablet Take 0.5 mg by mouth 2 (two) times daily as needed for anxiety.     hydrOXYzine (ATARAX) 25 MG tablet Take 25 mg by mouth 2 (two) times daily as needed for anxiety.     loratadine (CLARITIN) 10 MG tablet Take 10 mg by mouth daily as needed for allergies.     losartan (COZAAR) 50 MG tablet Take 1 tablet (50 mg total) by mouth daily. 90 tablet 3   metoprolol succinate (TOPROL-XL) 50 MG 24 hr tablet TAKE 1 TABLET BY MOUTH ONCE DAILY WITH MEALS 90 tablet 3   mirtazapine (REMERON) 15 MG tablet Take 15 mg by mouth at bedtime.     QUEtiapine (SEROQUEL) 50 MG tablet Take 50 mg by mouth at bedtime.     sertraline (ZOLOFT) 50 MG tablet Take 150 mg by mouth daily.     XARELTO 20 MG TABS tablet TAKE 1 TABLET DAILY WITH SUPPER. (Patient taking differently: Take 20 mg by mouth daily with supper.) 90 tablet 1    Lab Results:  Results for orders placed or performed during the hospital encounter of 04/02/22 (from the past 48 hour(s))  Resp panel by RT-PCR (RSV, Flu A&B, Covid) Anterior Nasal Swab     Status:  None   Collection Time: 04/02/22  9:23 AM   Specimen: Anterior Nasal Swab  Result Value Ref Range   SARS Coronavirus 2 by RT PCR NEGATIVE NEGATIVE    Comment: (NOTE) SARS-CoV-2 target nucleic acids are NOT DETECTED.  The SARS-CoV-2 RNA is generally detectable in upper respiratory specimens during the  acute phase of infection. The lowest concentration of SARS-CoV-2 viral copies this assay can detect is 138 copies/mL. A negative result does not preclude SARS-Cov-2 infection and should not be used as the sole basis for treatment or other patient management decisions. A negative result may occur with  improper specimen collection/handling, submission of specimen other than nasopharyngeal swab, presence of viral mutation(s) within the areas targeted by this assay, and inadequate number of viral copies(<138 copies/mL). A negative result must be combined with clinical observations, patient history, and epidemiological information. The expected result is Negative.  Fact Sheet for Patients:  EntrepreneurPulse.com.au  Fact Sheet for Healthcare Providers:  IncredibleEmployment.be  This test is no t yet approved or cleared by the [place] FDA and  has been authorized for detection and/or diagnosis of SARS-CoV-2 by FDA under an Emergency Use Authorization (EUA). This EUA will remain  in effect (meaning this test can be used) for the duration of the COVID-19 declaration under Section 564(b)(1) of the Act, 21 U.S.C.section 360bbb-3(b)(1), unless the authorization is terminated  or revoked sooner.       Influenza A by PCR NEGATIVE NEGATIVE   Influenza B by PCR NEGATIVE NEGATIVE    Comment: (NOTE) The Xpert Xpress SARS-CoV-2/FLU/RSV plus assay is intended as an aid in the diagnosis of influenza from Nasopharyngeal swab specimens and should not be used as a sole basis for treatment. Nasal washings and aspirates are unacceptable for Xpert Xpress SARS-CoV-2/FLU/RSV testing.  Fact Sheet for Patients: EntrepreneurPulse.com.au  Fact Sheet for Healthcare Providers: IncredibleEmployment.be  This test is not yet approved or cleared by the [place] FDA and has been authorized for detection and/or diagnosis of  SARS-CoV-2 by FDA under an Emergency Use Authorization (EUA). This EUA will remain in effect (meaning this test can be used) for the duration of the COVID-19 declaration under Section 564(b)(1) of the Act, 21 U.S.C. section 360bbb-3(b)(1), unless the authorization is terminated or revoked.     Resp Syncytial Virus by PCR NEGATIVE NEGATIVE    Comment: (NOTE) Fact Sheet for Patients: EntrepreneurPulse.com.au  Fact Sheet for Healthcare Providers: IncredibleEmployment.be  This test is not yet approved or cleared by the [place] FDA and has been authorized for detection and/or diagnosis of SARS-CoV-2 by FDA under an Emergency Use Authorization (EUA). This EUA will remain in effect (meaning this test can be used) for the duration of the COVID-19 declaration under Section 564(b)(1) of the Act, 21 U.S.C. section 360bbb-3(b)(1), unless the authorization is terminated or revoked.  Performed at [facility], [place] [address], [place], [place] [address]   CBC with Differential     Status: Abnormal   Collection Time: 04/02/22 10:00 AM  Result Value Ref Range   WBC 5.1 4.0 - 10.5 K/uL   RBC 4.62 4.22 - 5.81 MIL/uL   Hemoglobin 13.7 13.0 - 17.0 g/dL   HCT 40.9 39.0 - 52.0 %   MCV 88.5 80.0 - 100.0 fL   MCH 29.7 26.0 - 34.0 pg   MCHC 33.5 30.0 - 36.0 g/dL   RDW 14.8 11.5 - 15.5 %   Platelets 100 (L) 150 - 400 K/uL    Comment: Immature Platelet  Fraction may be clinically indicated, consider ordering this additional test [id number]    nRBC 0.0 0.0 - 0.2 %   Neutrophils Relative % 81 %   Neutro Abs 4.1 1.7 - 7.7 K/uL   Lymphocytes Relative 8 %   Lymphs Abs 0.4 (L) 0.7 - 4.0 K/uL   Monocytes Relative 10 %   Monocytes Absolute 0.5 0.1 - 1.0 K/uL   Eosinophils Relative 1 %   Eosinophils Absolute 0.0 0.0 - 0.5 K/uL   Basophils Relative 0 %   Basophils Absolute 0.0 0.0 - 0.1 K/uL   Immature Granulocytes 0 %   Abs Immature  Granulocytes 0.02 0.00 - 0.07 K/uL    Comment: Performed at [facility], [place] [address] [place], [place] [address]  Comprehensive metabolic panel     Status: Abnormal   Collection Time: 04/02/22 10:00 AM  Result Value Ref Range   Sodium 138 135 - 145 mmol/L   Potassium 3.7 3.5 - 5.1 mmol/L   Chloride 103 98 - 111 mmol/L   CO2 25 22 - 32 mmol/L   Glucose, Bld 160 (H) 70 - 99 mg/dL    Comment: Glucose reference range applies only to samples taken after fasting for at least 8 hours.   BUN 15 8 - 23 mg/dL   Creatinine, Ser 1.21 0.61 - 1.24 mg/dL   Calcium 8.9 8.9 - 10.3 mg/dL   Total Protein 6.2 (L) 6.5 - 8.1 g/dL   Albumin 3.8 3.5 - 5.0 g/dL   AST 20 15 - 41 U/L   ALT 22 0 - 44 U/L   Alkaline Phosphatase 59 38 - 126 U/L   Total Bilirubin 1.2 0.3 - 1.2 mg/dL   GFR, Estimated >60 >60 mL/min    Comment: (NOTE) Calculated using the CKD-EPI Creatinine Equation (2021)    Anion gap 10 5 - 15    Comment: Performed at [facility], [place] [address], [place], [place] [address]  Ethanol     Status: None   Collection Time: 04/02/22 10:00 AM  Result Value Ref Range   Alcohol, Ethyl (B) <10 <10 mg/dL    Comment: (NOTE) Lowest detectable limit for serum alcohol is 10 mg/dL.  For medical purposes only. Performed at [facility] [place] [address], [place], [place] [id number]   Salicylate level     Status: Abnormal   Collection Time: 04/02/22 10:00 AM  Result Value Ref Range   Salicylate Lvl [id number] (L) 7.0 - 30.0 mg/dL    Comment: Performed at [facility], [place] [address], [place],  [address]  Rapid urine drug screen (hospital performed)     Status: None   Collection Time: 04/02/22  1:55 PM  Result Value Ref Range   Opiates NONE DETECTED NONE DETECTED   Cocaine NONE DETECTED NONE DETECTED   Benzodiazepines NONE DETECTED NONE DETECTED   Amphetamines NONE DETECTED NONE DETECTED   Tetrahydrocannabinol NONE DETECTED  NONE DETECTED   Barbiturates NONE DETECTED NONE DETECTED    Comment: (NOTE) DRUG SCREEN FOR MEDICAL PURPOSES ONLY.  IF CONFIRMATION IS NEEDED FOR ANY PURPOSE, NOTIFY LAB WITHIN 5 DAYS.  LOWEST DETECTABLE LIMITS FOR URINE DRUG SCREEN Drug Class                     Cutoff (ng/mL) Amphetamine and metabolites    1000 Barbiturate and metabolites    200 Benzodiazepine  200 Opiates and metabolites        300 Cocaine and metabolites        300 THC                            50 Performed at [place] [address] [place], [place] [address]   Acetaminophen level     Status: Abnormal   Collection Time: 04/02/22  9:10 PM  Result Value Ref Range   Acetaminophen (Tylenol), Serum <10 (L) 10 - 30 ug/mL    Comment: (NOTE) Therapeutic concentrations vary significantly. A range of 10-30 ug/mL  may be an effective concentration for many patients. However, some  are best treated at concentrations outside of this range. Acetaminophen concentrations >150 ug/mL at 4 hours after ingestion  and >50 ug/mL at 12 hours after ingestion are often associated with  toxic reactions.  Performed at [facility], [place] [address], [place], [place] [address]     Blood Alcohol level:  Lab Results  Component Value Date   ETH <10 04/02/2022    Physical Findings:  CIWA:    COWS:     Musculoskeletal: Strength & Muscle Tone: within normal limits Gait & Station: normal Patient leans: N/A  Psychiatric Specialty Exam:  Presentation  General Appearance:  Appropriate for Environment  Eye Contact: Fair  Speech: Slurred  Speech Volume: Normal  Handedness: Right   Mood and Affect  Mood: Hopeless  Affect: Depressed; Flat   Thought Process  Thought Processes: Coherent  Descriptions of Associations:Intact  Orientation:Full (Time, Place and Person)  Thought Content:WDL  History of Schizophrenia/Schizoaffective disorder:No data  recorded Duration of Psychotic Symptoms:No data recorded Hallucinations:Hallucinations: None  Ideas of Reference:None  Suicidal Thoughts:Suicidal Thoughts: Yes, Active  Homicidal Thoughts:Homicidal Thoughts: No   Sensorium  Memory: Immediate Good; Remote Good; Recent Good  Judgment: Fair  Insight: Good   Executive Functions  Concentration: Good  Attention Span: Good  Recall: Good  Fund of Knowledge: Good  Language: Good   Psychomotor Activity  Psychomotor Activity: Psychomotor Activity: Normal   Assets  Assets: Communication Skills; Financial Resources/Insurance; Housing   Sleep  Sleep: Sleep: Fair    Physical Exam: Physical Exam Vitals and nursing note reviewed.  Cardiovascular:     Rate and Rhythm: Normal rate.  Pulmonary:     Effort: Pulmonary effort is normal.  Neurological:     Mental Status: He is alert.  Psychiatric:        Attention and Perception: Attention normal.        Mood and Affect: Mood is anxious and depressed.        Speech: Speech normal.        Behavior: Behavior is cooperative.        Thought Content: Thought content includes suicidal ideation.        Cognition and Memory: Memory normal.        Judgment: Judgment is inappropriate.    Review of Systems  HENT: Negative.    Respiratory: Negative.    Musculoskeletal: Negative.   Skin: Negative.   Psychiatric/Behavioral:  Positive for depression and suicidal ideas.    Blood pressure (!) 140/89, Yoder 60, temperature 98 F (36.7 C), temperature source Oral, resp. rate 18, height 5' 10"$  (1.778 m), weight 103.4 kg, SpO2 97 %. Body mass index is 32.71 kg/m.   Medical Decision Making: Patient continues to require inpatient Psychiatric hospitalization.  Patient has been compliant with medications  while in the ED.    [place], PMHNP 04/03/2022, 5:08 PM

## 2022-04-04 ENCOUNTER — Encounter: Payer: Self-pay | Admitting: Psychiatry

## 2022-04-04 ENCOUNTER — Inpatient Hospital Stay
Admission: AD | Admit: 2022-04-04 | Discharge: 2022-04-15 | DRG: 885 | Disposition: A | Discharge status: Home / Self Care | Payer: Medicare Other | Source: Intra-hospital | Attending: Psychiatry | Admitting: Psychiatry

## 2022-04-04 ENCOUNTER — Other Ambulatory Visit: Payer: Self-pay

## 2022-04-04 DIAGNOSIS — Z20822 Contact with and (suspected) exposure to covid-19: Secondary | ICD-10-CM | POA: Diagnosis present

## 2022-04-04 DIAGNOSIS — F3163 Bipolar disorder, current episode mixed, severe, without psychotic features: Secondary | ICD-10-CM | POA: Diagnosis not present

## 2022-04-04 DIAGNOSIS — Z7901 Long term (current) use of anticoagulants: Secondary | ICD-10-CM

## 2022-04-04 DIAGNOSIS — I129 Hypertensive chronic kidney disease with stage 1 through stage 4 chronic kidney disease, or unspecified chronic kidney disease: Secondary | ICD-10-CM | POA: Diagnosis present

## 2022-04-04 DIAGNOSIS — T43592A Poisoning by other antipsychotics and neuroleptics, intentional self-harm, initial encounter: Secondary | ICD-10-CM | POA: Diagnosis present

## 2022-04-04 DIAGNOSIS — Z85828 Personal history of other malignant neoplasm of skin: Secondary | ICD-10-CM | POA: Diagnosis not present

## 2022-04-04 DIAGNOSIS — F609 Personality disorder, unspecified: Secondary | ICD-10-CM | POA: Diagnosis present

## 2022-04-04 DIAGNOSIS — I420 Dilated cardiomyopathy: Secondary | ICD-10-CM | POA: Diagnosis present

## 2022-04-04 DIAGNOSIS — G4700 Insomnia, unspecified: Secondary | ICD-10-CM | POA: Diagnosis present

## 2022-04-04 DIAGNOSIS — Z635 Disruption of family by separation and divorce: Secondary | ICD-10-CM | POA: Diagnosis not present

## 2022-04-04 DIAGNOSIS — I2510 Atherosclerotic heart disease of native coronary artery without angina pectoris: Secondary | ICD-10-CM | POA: Diagnosis present

## 2022-04-04 DIAGNOSIS — I4891 Unspecified atrial fibrillation: Secondary | ICD-10-CM | POA: Diagnosis present

## 2022-04-04 DIAGNOSIS — F419 Anxiety disorder, unspecified: Secondary | ICD-10-CM | POA: Diagnosis present

## 2022-04-04 DIAGNOSIS — Z905 Acquired absence of kidney: Secondary | ICD-10-CM

## 2022-04-04 DIAGNOSIS — I080 Rheumatic disorders of both mitral and aortic valves: Secondary | ICD-10-CM | POA: Diagnosis present

## 2022-04-04 DIAGNOSIS — F6089 Other specific personality disorders: Secondary | ICD-10-CM | POA: Diagnosis present

## 2022-04-04 DIAGNOSIS — Z79899 Other long term (current) drug therapy: Secondary | ICD-10-CM | POA: Diagnosis not present

## 2022-04-04 MED ORDER — RIVAROXABAN 20 MG PO TABS
20.0000 mg | ORAL_TABLET | Freq: Every day | ORAL | Status: DC
  Administered 2022-04-04 – 2022-04-14 (×11): 20 mg via ORAL
  Filled 2022-04-04 (×13): qty 1

## 2022-04-04 MED ORDER — ALLOPURINOL 300 MG PO TABS
150.0000 mg | ORAL_TABLET | Freq: Every day | ORAL | Status: DC
  Administered 2022-04-04 – 2022-04-15 (×12): 150 mg via ORAL
  Filled 2022-04-04 (×12): qty 0.5

## 2022-04-04 MED ORDER — ACETAMINOPHEN 500 MG PO TABS: 1000.0000 mg | ORAL_TABLET | Freq: Four times a day (QID) | ORAL | Status: DC | PRN

## 2022-04-04 MED ORDER — ALUM & MAG HYDROXIDE-SIMETH 200-200-20 MG/5ML PO SUSP: 30.0000 mL | ORAL | Status: DC | PRN

## 2022-04-04 MED ORDER — LOSARTAN POTASSIUM 25 MG PO TABS
50.0000 mg | ORAL_TABLET | Freq: Every day | ORAL | Status: DC
  Administered 2022-04-04 – 2022-04-05 (×2): 50 mg via ORAL
  Filled 2022-04-04 (×2): qty 2

## 2022-04-04 MED ORDER — ARIPIPRAZOLE 2 MG PO TABS
2.0000 mg | ORAL_TABLET | Freq: Every day | ORAL | Status: DC
  Administered 2022-04-04: 2 mg via ORAL
  Filled 2022-04-04: qty 1

## 2022-04-04 MED ORDER — MAGNESIUM HYDROXIDE 400 MG/5ML PO SUSP
30.0000 mL | Freq: Every day | ORAL | Status: DC | PRN
  Administered 2022-04-07 – 2022-04-10 (×2): 30 mL via ORAL
  Filled 2022-04-04 (×3): qty 30

## 2022-04-04 MED ORDER — RENA-VITE PO TABS
1.0000 | ORAL_TABLET | Freq: Every day | ORAL | Status: DC
  Administered 2022-04-04 – 2022-04-14 (×11): 1 via ORAL
  Filled 2022-04-04 (×11): qty 1

## 2022-04-04 MED ORDER — SERTRALINE HCL 50 MG PO TABS
150.0000 mg | ORAL_TABLET | Freq: Every day | ORAL | Status: DC
  Administered 2022-04-04: 150 mg via ORAL
  Filled 2022-04-04: qty 3

## 2022-04-04 MED ORDER — ATORVASTATIN CALCIUM 10 MG PO TABS
40.0000 mg | ORAL_TABLET | Freq: Every day | ORAL | Status: DC
  Administered 2022-04-04 – 2022-04-15 (×12): 40 mg via ORAL
  Filled 2022-04-04 (×14): qty 4

## 2022-04-04 MED ORDER — MIRTAZAPINE 15 MG PO TABS
15.0000 mg | ORAL_TABLET | Freq: Every day | ORAL | Status: DC
  Administered 2022-04-04 – 2022-04-05 (×2): 15 mg via ORAL
  Filled 2022-04-04 (×2): qty 1

## 2022-04-04 MED ORDER — METOPROLOL SUCCINATE ER 25 MG PO TB24
50.0000 mg | ORAL_TABLET | Freq: Every day | ORAL | Status: DC
  Administered 2022-04-04 – 2022-04-15 (×12): 50 mg via ORAL
  Filled 2022-04-04 (×12): qty 2

## 2022-04-04 MED ORDER — VENLAFAXINE HCL ER 75 MG PO CP24
75.0000 mg | ORAL_CAPSULE | Freq: Every day | ORAL | Status: DC
  Administered 2022-04-05: 75 mg via ORAL
  Filled 2022-04-04: qty 1

## 2022-04-04 MED ORDER — HYDROXYZINE HCL 25 MG PO TABS: 25.0000 mg | ORAL_TABLET | Freq: Three times a day (TID) | ORAL | Status: DC | PRN

## 2022-04-04 MED ORDER — TRAZODONE HCL 50 MG PO TABS: 50.0000 mg | ORAL_TABLET | Freq: Every evening | ORAL | Status: DC | PRN

## 2022-04-04 NOTE — H&P (Signed)
Psychiatric Admission Assessment Adult  Patient Identification: Bruce Yoder MRN:  [id number] Date of Evaluation:  04/04/2022 Chief Complaint:  Bipolar 1 disorder ([place]) [F31.9] Principal Diagnosis: Bipolar 1 disorder, mixed, severe ([place]) Diagnosis:  Principal Problem:   Bipolar 1 disorder, mixed, severe ([place]) Active Problems:   Cluster B personality disorder in adult [facility])  History of Present Illness: Bruce Yoder is a 77 year old white male who took an overdose of leftover Seroquel from his primary care doctor.  He was recently discharged from [facility] and has been there twice and also did some outpatient with Dr. Reece Levy.  He has been feeling overwhelmed with his current divorce.  He states that he has mediation in 2 weeks.  He endorses anhedonia, difficulty sleeping, depressed mood and anxiety.  His first wife died in 05-31-06 and he remarried for the last 12 years.  He is the one that is filing for divorce.  So his stressors are inability to cope with the changes.  He has been hospitalized 1 time before 20 years ago at [facility] regional with a similar presentation of being stressed and overwhelmed as an Producer, television/film/video.  He is currently on Zoloft, Remeron, and Abilify.  He states he does not know whether it has been helpful.  He says that he took the overdose of Seroquel with an intent to end his life.  His daughter found him and brought him to the hospital.  He currently denies any suicidal ideation at this time.  PER NP EVALUATION:   Bruce Yoder, 77 y.o., male patient seen face to face by this provider, consulted with Dr. Dwyane Dee; and chart reviewed on 04/02/22.  On evaluation Bruce Yoder reports that he feels life has got the better of him.  Said that he has been separated from his wife for a year, they are actually getting ready to start divorce proceedings, says that he does not miss being married to her but missed the life that they used to have.  Says that he left his wife and moved to  [place] so he could play in a [place] with friends and after 9 months realized he did not like it and realized he missed his old life and he has become sad about life.  Says that he lives by himself but very close to his daughter's home, Bruce Yoder she is his only child.  Says that he has been going to [facility] program but [facility], and today he had an appointment and his daughter picks him up and takes him, said he started thinking about life today and took an unknown amount of his Seroquel that was prescribed by his PCP for sleep.  Patient says sometimes he feels like he cannot go on with life and is being a burden to his only child and a lot of the times he wants to die.  Patient said he has a lot of stress of the divorce settlement, their taxes, and living by himself.  Patient says that his appetite and sleep are poor due to anxiety and stress.  Patient says that he has been hospitalized a few times in his life says that he was just an [facility] [place] 2 weeks ago and on New Year's he was at [facility] for depression.  Patient states that he takes sertraline, Abilify, and Remeron for his depression and anxiety.  Patient denies HI/AVH, but passively endorses SI, says "I do not want to say too much I may did myself into a deeper  hole "patient said "seemed like to make things worse, my daughter was coming to pick me up to take me to [facility], and now she has to bring me here and she is worried about me and she supposed to go out of town to [place] for the weekend."Patient denies using illicit drugs or alcohol.  Patient says that he enjoys playing [place] with his friends, and enjoys playing golf.  Associated Signs/Symptoms: Depression Symptoms:  depressed mood, hopelessness, anxiety, disturbed sleep, (Hypo) Manic Symptoms:  Impulsivity, Anxiety Symptoms:  Excessive Worry, Panic Symptoms, Social Anxiety, Psychotic Symptoms:   N/A PTSD Symptoms: NA Total Time spent with patient: 1  hour  Past Psychiatric History: He has 1 previous psychiatric admission at [facility] for suicidal ideation 20 years ago.  He was recently discharged from [facility] and has been following up in outpatient with Dr. Reece Levy for the past couple weeks.  Recently took an overdose of Seroquel that was prescribed by his PCP.  Is the patient at risk to self? Yes.    Has the patient been a risk to self in the past 6 months? Yes.    Has the patient been a risk to self within the distant past? Yes.    Is the patient a risk to others? No.  Has the patient been a risk to others in the past 6 months? No.  Has the patient been a risk to others within the distant past? No.   [place] Scale:  [facility] Admission (Current) from 04/04/2022 in [place] ED from 04/02/2022 in [facility] Department at [facility] Admission (Discharged) from 10/11/2020 in [place] CATH LAB  C-SSRS RISK CATEGORY Moderate Risk High Risk No Risk        Prior Inpatient Therapy: Yes.   If yes, describe as above Prior Outpatient Therapy: Yes.   If yes, describe as above  Alcohol Screening: 1. How often do you have a drink containing alcohol?: Monthly or less 2. How many drinks containing alcohol do you have on a typical day when you are drinking?: 3 or 4 3. How often do you have six or more drinks on one occasion?: Never AUDIT-C Score: 2 4. How often during the last year have you found that you were not able to stop drinking once you had started?: Never 5. How often during the last year have you failed to do what was normally expected from you because of drinking?: Never 6. How often during the last year have you needed a first drink in the morning to get yourself going after a heavy drinking session?: Never 7. How often during the last year have you had a feeling of guilt of remorse after drinking?: Never 8. How often during the last year have you  been unable to remember what happened the night before because you had been drinking?: Never 9. Have you or someone else been injured as a result of your drinking?: No 10. Has a relative or friend or a doctor or another health worker been concerned about your drinking or suggested you cut down?: No Alcohol Use Disorder Identification Test Final Score (AUDIT): 2 Alcohol Brief Interventions/Follow-up: Alcohol education/Brief advice Substance Abuse History in the last 12 months:  No. Consequences of Substance Abuse: NA Previous Psychotropic Medications: Yes  Psychological Evaluations: Yes  Past Medical History:  Past Medical History:  Diagnosis Date   Atrial fibrillation ([place])    a. diagnosed on 10/22/2017;  b. successful DCCV on 12/14/2017; Paroxysmal Persistent   Basal cell carcinoma (BCC) of back    Dr. Elvera Lennox   CAD (coronary artery disease)    a. LHC 01/31/2018: OM2 55-60%, FFR 0.93. pLAD 25%   CKD (chronic kidney disease) stage 3, GFR 30-59 ml/min (HCC)    Unilateral kidney   Dilated cardiomyopathy -related to A. fib RVR; resolved 10/2017   Thought to be related to A. fib RVR: a) echo 11/03/2017 showed LVEF of 35-40% with severe LVH --> follow-up echo January 2020 showed resolution with EF 55 to 60%.  Normal filling pressures.  Read as mild as opposed to severe LVH   Diverticulosis of colon    With polyps (Dr. Amedeo Plenty March 2017, followed by Dr. Glennon Hamilton)   Erectile dysfunction    Essential hypertension 2018   Gout    OSA on CPAP    [facility]   Status post nephrectomy 1965   Thrombocytopenia, unspecified ([place])    Platelet clumping (pseudothrombocytopenia)    Past Surgical History:  Procedure Laterality Date   APPENDECTOMY     During childhood   ATRIAL FIBRILLATION ABLATION N/A 10/11/2020   Procedure: ATRIAL FIBRILLATION ABLATION;  Surgeon: Constance Haw, MD;  Location: [place] CV LAB;  Service: Cardiovascular;  Laterality: N/A;    CARDIOVERSION N/A 12/14/2017   Procedure: CARDIOVERSION;  Surgeon: Sueanne Margarita, MD;  Location: [facility] ENDOSCOPY;  Service: Cardiovascular;  Laterality: N/A;   CARDIOVERSION N/A 07/04/2020   Procedure: CARDIOVERSION;  Surgeon: Sueanne Margarita, MD;  Location: [facility];  Service: Cardiovascular;  Laterality: N/A;   CYST EXCISION     face   INTRAVASCULAR PRESSURE WIRE/FFR STUDY N/A 01/31/2018   Procedure: INTRAVASCULAR PRESSURE WIRE/FFR STUDY;  Surgeon: Leonie Man, MD;  Location: [place] CV LAB;  Service: Cardiovascular;  Laterality: N/A;   LEFT HEART CATH AND CORONARY ANGIOGRAPHY N/A 01/31/2018   Procedure: LEFT HEART CATH AND CORONARY ANGIOGRAPHY;  Surgeon: Leonie Man, MD;  Location: [place] CV LAB;  Service: Cardiovascular: Single-vessel disease with roughly 60% lesion in major OM branch.  FFR negative.   NEPHRECTOMY Right 1965   TRANSTHORACIC ECHOCARDIOGRAM  10/2017    EF 35 -40% with severe LVH.  Diffuse global hypokinesis.  Mild aortic and mitral regurgitation.  Mild left and right atrial dilation.   TRANSTHORACIC ECHOCARDIOGRAM  02/2018   EF 55-60%, normal filling pressure. Mod LA dilation.  Mild to moderate TR. -> resolution of cardiomyopathy   VASECTOMY  1989   Family History:  Family History  Problem Relation Age of Onset   Colon cancer Mother    Hypertension Father    Seizures Father    Stroke Father    Dementia Paternal Grandmother    Heart attack Paternal Grandfather    Family Psychiatric  History: Unremarkable Tobacco Screening:  Social History   Tobacco Use  Smoking Status Never  Smokeless Tobacco Never    BH Tobacco Counseling     Are you interested in Tobacco Cessation Medications?  N/A, patient does not use tobacco products Counseled patient on smoking cessation:  N/A, patient does not use tobacco products Reason Tobacco Screening Not Completed: No value filed.       Social History:  Social History   Substance and Sexual Activity   Alcohol Use Not Currently   Alcohol/week: 2.0 standard drinks of alcohol   Types: 1 Glasses of wine, 1 Cans of beer per week     Social History   Substance and  Sexual Activity  Drug Use Never    Additional Social History:                           Allergies:  No Known Allergies Lab Results:  Results for orders placed or performed during the hospital encounter of 04/02/22 (from the past 48 hour(s))  Rapid urine drug screen (hospital performed)     Status: None   Collection Time: 04/02/22  1:55 PM  Result Value Ref Range   Opiates NONE DETECTED NONE DETECTED   Cocaine NONE DETECTED NONE DETECTED   Benzodiazepines NONE DETECTED NONE DETECTED   Amphetamines NONE DETECTED NONE DETECTED   Tetrahydrocannabinol NONE DETECTED NONE DETECTED   Barbiturates NONE DETECTED NONE DETECTED    Comment: (NOTE) DRUG SCREEN FOR MEDICAL PURPOSES ONLY.  IF CONFIRMATION IS NEEDED FOR ANY PURPOSE, NOTIFY LAB WITHIN 5 DAYS.  LOWEST DETECTABLE LIMITS FOR URINE DRUG SCREEN Drug Class                     Cutoff (ng/mL) Amphetamine and metabolites    1000 Barbiturate and metabolites    200 Benzodiazepine                 200 Opiates and metabolites        300 Cocaine and metabolites        300 THC                            50 Performed at [facility], [place] [address], [place], [place] [address]   Acetaminophen level     Status: Abnormal   Collection Time: 04/02/22  9:10 PM  Result Value Ref Range   Acetaminophen (Tylenol), Serum <10 (L) 10 - 30 ug/mL    Comment: (NOTE) Therapeutic concentrations vary significantly. A range of 10-30 ug/mL  may be an effective concentration for many patients. However, some  are best treated at concentrations outside of this range. Acetaminophen concentrations >150 ug/mL at 4 hours after ingestion  and >50 ug/mL at 12 hours after ingestion are often associated with  toxic reactions.  Performed at [facility],  [place] [address], [place], [place] [address]     Blood Alcohol level:  Lab Results  Component Value Date   ETH <10 [id number]    Metabolic Disorder Labs:  No results found for: "HGBA1C", "MPG" No results found for: "PROLACTIN" Lab Results  Component Value Date   CHOL 142 02/01/2018   TRIG 105 02/01/2018   HDL 34 (L) 02/01/2018   CHOLHDL 4.2 02/01/2018   VLDL 21 02/01/2018   LDLCALC 87 02/01/2018    Current Medications: Current Facility-Administered Medications  Medication Dose Route Frequency Provider Last Rate Last Admin   acetaminophen (TYLENOL) tablet 1,000 mg  1,000 mg Oral Q6H PRN Deloria Lair, NP       allopurinol (ZYLOPRIM) tablet 150 mg  150 mg Oral Daily Doren Custard, Rashaun M, NP   150 mg at 04/04/22 0913   alum & mag hydroxide-simeth (MAALOX/MYLANTA) 200-200-20 MG/5ML suspension 30 mL  30 mL Oral Q4H PRN Deloria Lair, NP       atorvastatin (LIPITOR) tablet 40 mg  40 mg Oral Daily Dixon, Rashaun M, NP       hydrOXYzine (ATARAX) tablet 25 mg  25 mg Oral TID PRN Deloria Lair, NP       losartan (COZAAR) tablet  50 mg  50 mg Oral Daily Deloria Lair, NP   50 mg at 04/04/22 0912   magnesium hydroxide (MILK OF MAGNESIA) suspension 30 mL  30 mL Oral Daily PRN Deloria Lair, NP       metoprolol succinate (TOPROL-XL) 24 hr tablet 50 mg  50 mg Oral Daily Doren Custard, Rashaun M, NP   50 mg at 04/04/22 0912   mirtazapine (REMERON) tablet 15 mg  15 mg Oral QHS Deloria Lair, NP       multivitamin (RENA-VIT) tablet 1 tablet  1 tablet Oral QHS Dixon, Ernst Bowler, NP       rivaroxaban (XARELTO) tablet 20 mg  20 mg Oral Q supper Doren Custard, Rashaun M, NP       traZODone (DESYREL) tablet 50 mg  50 mg Oral QHS PRN Deloria Lair, NP       [START ON 04/05/2022] venlafaxine XR (EFFEXOR-XR) 24 hr capsule 75 mg  75 mg Oral QPC breakfast Parks Ranger, DO       PTA Medications: Medications Prior to Admission  Medication Sig Dispense Refill Last Dose   acetaminophen (TYLENOL)  500 MG tablet Take 1,000 mg by mouth every 6 (six) hours as needed for moderate pain.      allopurinol (ZYLOPRIM) 300 MG tablet Take 150 mg by mouth daily.       ARIPiprazole (ABILIFY) 2 MG tablet Take 2 mg by mouth daily.      atorvastatin (LIPITOR) 40 MG tablet Take 1 tablet (40 mg total) by mouth daily. 90 tablet 3    b complex-vitamin c-folic acid (NEPHRO-VITE) 0.8 MG TABS tablet Take 1 tablet by mouth 2 (two) times daily.      clonazePAM (KLONOPIN) 0.5 MG tablet Take 0.5 mg by mouth 2 (two) times daily as needed for anxiety.      hydrOXYzine (ATARAX) 25 MG tablet Take 25 mg by mouth 2 (two) times daily as needed for anxiety.      loratadine (CLARITIN) 10 MG tablet Take 10 mg by mouth daily as needed for allergies.      losartan (COZAAR) 50 MG tablet Take 1 tablet (50 mg total) by mouth daily. 90 tablet 3    metoprolol succinate (TOPROL-XL) 50 MG 24 hr tablet TAKE 1 TABLET BY MOUTH ONCE DAILY WITH MEALS 90 tablet 3    mirtazapine (REMERON) 15 MG tablet Take 15 mg by mouth at bedtime.      QUEtiapine (SEROQUEL) 50 MG tablet Take 50 mg by mouth at bedtime.      sertraline (ZOLOFT) 50 MG tablet Take 150 mg by mouth daily.      XARELTO 20 MG TABS tablet TAKE 1 TABLET DAILY WITH SUPPER. (Patient taking differently: Take 20 mg by mouth daily with supper.) 90 tablet 1     Musculoskeletal: Strength & Muscle Tone: within normal limits Gait & Station: normal Patient leans: N/A            Psychiatric Specialty Exam:  Presentation  General Appearance:  Appropriate for Environment  Eye Contact: Fair  Speech: Slurred  Speech Volume: Normal  Handedness: Right   Mood and Affect  Mood: Hopeless  Affect: Depressed; Flat   Thought Process  Thought Processes: Coherent  Duration of Psychotic Symptoms:N/A Past Diagnosis of Schizophrenia or Psychoactive disorder: No data recorded Descriptions of Associations:Intact  Orientation:Full (Time, Place and Person)  Thought  Content:WDL  Hallucinations:No data recorded Ideas of Reference:None  Suicidal Thoughts:No data recorded Homicidal Thoughts:No data recorded  Sensorium  Memory: Immediate Good; Remote Good; Recent Good  Judgment: Fair  Insight: Good   Executive Functions  Concentration: Good  Attention Span: Good  Recall: Good  Fund of Knowledge: Good  Language: Good   Psychomotor Activity  Psychomotor Activity:No data recorded  Assets  Assets: Communication Skills; Financial Resources/Insurance; Housing   Sleep  Sleep:No data recorded   Physical Exam: Physical Exam Vitals and nursing note reviewed.  Constitutional:      Appearance: Normal appearance. He is normal weight.  HENT:     Head: Normocephalic and atraumatic.     Nose: Nose normal.     Mouth/Throat:     Pharynx: Oropharynx is clear.  Eyes:     Extraocular Movements: Extraocular movements intact.     Pupils: Pupils are equal, round, and reactive to light.  Cardiovascular:     Rate and Rhythm: Normal rate and regular rhythm.     Pulses: Normal pulses.     Heart sounds: Normal heart sounds.  Pulmonary:     Effort: Pulmonary effort is normal.     Breath sounds: Normal breath sounds.  Abdominal:     General: Abdomen is flat. Bowel sounds are normal.     Palpations: Abdomen is soft.  Musculoskeletal:        General: Normal range of motion.     Cervical back: Normal range of motion and neck supple.  Skin:    General: Skin is warm and dry.  Neurological:     General: No focal deficit present.     Mental Status: He is alert and oriented to person, place, and time.  Psychiatric:        Attention and Perception: Attention and perception normal.        Mood and Affect: Mood is depressed. Affect is flat.        Speech: Speech normal.        Behavior: Behavior normal. Behavior is cooperative.        Thought Content: Thought content is paranoid.        Cognition and Memory: Cognition and memory normal.         Judgment: Judgment is impulsive.    Review of Systems  Constitutional: Negative.   HENT: Negative.    Eyes: Negative.   Respiratory: Negative.    Cardiovascular: Negative.   Gastrointestinal: Negative.   Genitourinary: Negative.   Musculoskeletal: Negative.   Skin: Negative.   Neurological: Negative.   Endo/Heme/Allergies: Negative.   Psychiatric/Behavioral:  Positive for depression. The patient has insomnia.    Blood pressure (!) 142/76, pulse 66, temperature 97.8 F (36.6 C), temperature source Oral, resp. rate 16, height 5' 10"$  (1.778 m), weight 104.3 kg, SpO2 93 %. Body mass index is 33 kg/m.  Treatment Plan Summary: Daily contact with patient to assess and evaluate symptoms and progress in treatment, Medication management, and Plan discontinue Zoloft and start Effexor XR.  Discontinue Abilify and consider Risperdal for better anxiety control.  Observation Level/Precautions:  15 minute checks  Laboratory:  CBC Chemistry Profile  Psychotherapy:    Medications:    Consultations:    Discharge Concerns:    Estimated LOS:  Other:     Physician Treatment Plan for Primary Diagnosis: Bipolar 1 disorder, mixed, severe ([place]) Long Term Goal(s): Improvement in symptoms so as ready for discharge  Short Term Goals: Ability to identify changes in lifestyle to reduce recurrence of condition will improve, Ability to verbalize feelings will improve, Ability to disclose and discuss suicidal ideas,  Ability to demonstrate self-control will improve, Ability to identify and develop effective coping behaviors will improve, Ability to maintain clinical measurements within normal limits will improve, Compliance with prescribed medications will improve, and Ability to identify triggers associated with substance abuse/mental health issues will improve  Physician Treatment Plan for Secondary Diagnosis: Principal Problem:   Bipolar 1 disorder, mixed, severe ([place]) Active Problems:   Cluster B  personality disorder in adult [facility])    I certify that inpatient services furnished can reasonably be expected to improve the patient's condition.    Parks Ranger, DO 2/10/202411:38 AM

## 2022-04-04 NOTE — [facility] Suicide Risk Assessment (Signed)
[facility] Admission Suicide Risk Assessment   Nursing information obtained from:  Patient Demographic factors:  Age 77 or older Current Mental Status:  NA Loss Factors:  Loss of significant relationship Historical Factors:  NA Risk Reduction Factors:  Positive social support  Total Time spent with patient: 15 minutes Principal Problem: Bipolar 1 disorder, mixed, severe ([place]) Diagnosis:  Principal Problem:   Bipolar 1 disorder, mixed, severe ([place])  Subjective Data: Bruce Yoder is a 77 year old white male who took an overdose of leftover Seroquel from his primary care doctor.  He was recently [facility] from [facility] and has been there twice and also did some outpatient with Dr. Reece Levy.  He has been feeling overwhelmed with his current divorce.  He states that he has mediation in 2 weeks.  He endorses anhedonia, difficulty sleeping, depressed mood and anxiety.  His first wife died in June 01, 2006 and he remarried for the last 12 years.  He is the one that is filing for divorce.  So his stressors are inability to cope with the changes.  He has been hospitalized 1 time before 20 years ago at [facility] with a similar presentation of being stressed and overwhelmed as an Producer, television/film/video.  He is currently on Zoloft, Remeron, and Abilify.  He states he does not know whether it has been helpful.  He says that he took the overdose of Seroquel with an intent to end his life.  His daughter found him and brought him to the hospital.  He currently denies any suicidal ideation at this time.  Continued Clinical Symptoms:  Alcohol Use Disorder Identification Test Final Score (AUDIT): 2 The "Alcohol Use Disorders Identification Test", Guidelines for Use in Primary Care, Second Edition.  World Pharmacologist [facility]). Score between 0-7:  no or low risk or alcohol related problems. Score between 8-15:  moderate risk of alcohol related problems. Score between 16-19:  high risk of alcohol related problems. Score 20 or  above:  warrants further diagnostic evaluation for alcohol dependence and treatment.   CLINICAL FACTORS:   Depression:   Anhedonia Hopelessness Insomnia Personality Disorders:   Cluster B   Musculoskeletal: Strength & Muscle Tone: within normal limits Gait & Station: normal Patient leans: N/A  Psychiatric Specialty Exam:  Presentation  General Appearance:  Appropriate for Environment  Eye Contact: Fair  Speech: Slurred  Speech Volume: Normal  Handedness: Right   Mood and Affect  Mood: Hopeless  Affect: Depressed; Flat   Thought Process  Thought Processes: Coherent  Descriptions of Associations:Intact  Orientation:Full (Time, Place and Person)  Thought Content:WDL  History of Schizophrenia/Schizoaffective disorder:No data recorded Duration of Psychotic Symptoms:No data recorded Hallucinations:No data recorded Ideas of Reference:None  Suicidal Thoughts:No data recorded Homicidal Thoughts:No data recorded  Sensorium  Memory: Immediate Good; Remote Good; Recent Good  Judgment: Fair  Insight: Good   Executive Functions  Concentration: Good  Attention Span: Good  Recall: Good  Fund of Knowledge: Good  Language: Good   Psychomotor Activity  Psychomotor Activity:No data recorded  Assets  Assets: Communication Skills; Financial Resources/Insurance; Housing   Sleep  Sleep:No data recorded    Blood pressure (!) 142/76, pulse 66, temperature 97.8 F (36.6 C), temperature source Oral, resp. rate 16, height 5' 10"$  (1.778 m), weight 104.3 kg, SpO2 93 %. Body mass index is 33 kg/m.   COGNITIVE FEATURES THAT CONTRIBUTE TO RISK:  Thought constriction (tunnel vision)    SUICIDE RISK:   Mild:  Suicidal ideation of limited frequency, intensity, duration, and specificity.  There are no identifiable plans, no associated intent, mild dysphoria and related symptoms, good self-control (both objective and subjective assessment), few  other risk factors, and identifiable protective factors, including available and accessible social support.  PLAN OF CARE: See orders  I certify that inpatient services furnished can reasonably be expected to improve the patient's condition.   Parks Ranger, DO 04/04/2022, 11:30 AM

## 2022-04-04 NOTE — Progress Notes (Signed)
Report received from [place]. Patient Alert and orient x4,  Admitted for suicide attempt. Reports having increased anxiety and depression. Patient also going through divorce with wife of 4 years. Patient denies any SI currently states he just made a stupid mistake. Patient swallowed 30 of his seroquel pills.  Admission assessment complete, patient tired would like to go to bed. Skin and contraband search completed and witnessed by [facility], Therapist, sports. No skin issues noted other that lots of moles on torso, no contraband found. Patient remains safe on unit with q 15 min checks.

## 2022-04-04 NOTE — Tx Team (Signed)
Initial Treatment Plan 04/04/2022 2:01 AM [facility] SCHU [id number]    PATIENT STRESSORS: Health problems   Marital or family conflict     PATIENT STRENGTHS: Average or above average intelligence  Capable of independent living    PATIENT IDENTIFIED PROBLEMS: Major Depression  Suicidal Attempt                    DISCHARGE CRITERIA:  Improved stabilization in mood, thinking, and/or behavior  PRELIMINARY DISCHARGE PLAN: Outpatient therapy  PATIENT/FAMILY INVOLVEMENT: This treatment plan has been presented to and reviewed with the patient, HARDWICK STREIFEL, and/or family member, .  The patient and family have been given the opportunity to ask questions and make suggestions.  Floyde Parkins, RN 04/04/2022, 2:01 AM

## 2022-04-04 NOTE — Group Note (Signed)
Date:  04/04/2022 Time:  5:43 PM  Group Topic/Focus:  Rediscovering Joy:   The focus of this group is to explore various ways to relieve stress in a positive manner.    Participation Level:  Active  Participation Quality:  Appropriate  Affect:  Appropriate  Cognitive:  Alert  Insight: Appropriate  Engagement in Group:  Engaged  Modes of Intervention:  Discussion  Additional Comments:  NA  Clydia Nieves l Shakea Isip 04/04/2022, 5:43 PM

## 2022-04-04 NOTE — Progress Notes (Signed)
Patient denies SI, HI, and AVH. He is calm and cooperative with assessment and AxOx4. Patient says he feels ashamed and regretful of his overdose attempt on seroquel. He feels he has let down his daughter. Patient says that this was his first suicide attempt. [facility] for depression and anxiety, but no SI attempt. Prior to that, he says the last time he was admitted to a [facility] facility was 20 years ago. His current stressor is that he is going through a divorce with his wife. He says he feels overwhelmed and feels a "lack of courage to face what is going to happen." He reports that he does not have a strong support system. He feels his daughter is burnt out from the last two months with him and he has a couple of friends, but they are not close enough to him to be a good support.   Currently, patient reports depression and rates it as a 10/10. He also reports high levels of anxiety and his hands are visibly shaking. He says he has tried hydroxyzine in the past for anxiety but cannot really tell if it is effective.   Patient is compliant with scheduled medications. He did not take Lipitor, which was scheduled for 10am, because he reports he normally takes it at night. Dose rescheduled for this evening. Patient interacts appropriately with others. He is visible in the milieu and goes from the dayroom to his room multiple times this morning. He ate breakfast in the dayroom and has been reading a book.

## 2022-04-04 NOTE — BHH Counselor (Signed)
Adult Comprehensive Assessment  Patient ID: Bruce Yoder, male   DOB: 10-10-1945, 77 y.o.   MRN: [id number]  Information Source: Information source: Patient  Current Stressors:  Patient states their primary concerns and needs for treatment are:: get myself stabilized, Patient states their goals for this hospitilization and ongoing recovery are:: be more optomistic and hopefu Educational / Learning stressors: na Employment / Job issues: retired since 2003 Family Relationships: currently separated from his wife since 2/23 Financial / Lack of resources (include bankruptcy): overwhelmed by needing to do taxes Housing / Lack of housing: just moved into his current home 2 weeks ago.  This home does not compare to previous lifestyle Physical health (include injuries & life threatening diseases): none Social relationships: na Substance abuse: na Bereavement / Loss: loss of marriage, feels guilty about letting daughter down  Living/Environment/Situation:  Living Arrangements: Alone Living conditions (as described by patient or guardian): good condition--single family home Who else lives in the home?: lives How long has patient lived in current situation?: 2 weeks What is atmosphere in current home: Comfortable  Family History:  Marital status: Separated Separated, when?: 2/23 What types of issues is patient dealing with in the relationship?: currently in the divorce process Are you sexually active?: No What is your sexual orientation?: heterosexual Has your sexual activity been affected by drugs, alcohol, medication, or emotional stress?: na Does patient have children?: Yes How many children?: 1 How is patient's relationship with their children?: daughter: good relationship.  "Bruce Yoder been relying too much on her"  Childhood History:  By whom was/is the patient raised?: Both parents Additional childhood history information: Parents stayed together, "Happy childhood" Description of  patient's relationship with caregiver when they were a child: mom: loving, positive,  dad: also good, enjoyed sports together, he had some underlying angering Patient's description of current relationship with people who raised him/her: both parents deceased How were you disciplined when you got in trouble as a child/adolescent?: appropriate discipline Does patient have siblings?: Yes Number of Siblings: 1 Description of patient's current relationship with siblings: currently cut off from his brother in [place] for the past 8 years over an inheritance issues.  Prior to that, had a good relationship Did patient suffer any verbal/emotional/physical/sexual abuse as a child?: No Did patient suffer from severe childhood neglect?: No Has patient ever been sexually abused/assaulted/raped as an adolescent or adult?: No Was the patient ever a victim of a crime or a disaster?: No Witnessed domestic violence?: No Has patient been affected by domestic violence as an adult?: No  Education:  Highest grade of school patient has completed: [employer] Currently a student?: No Learning disability?: No  Employment/Work Situation:   Employment Situation: Retired Social research officer, government has Been Impacted by Current Illness:  (na) What is the Longest Time Patient has Held a Job?: 23 years Where was the Patient Employed at that Time?: [employer] Has Patient ever Been in the [employer]?: No  Financial Resources:   Museum/gallery curator resources:  (Fish farm manager, retirement accounts, inheritance) Does patient have a Programmer, applications or guardian?: No  Alcohol/Substance Abuse:   What has been your use of drugs/alcohol within the last 12 months?: stopped drinking 2 months ago, prior to that drank several times per week, denies drug If attempted suicide, did drugs/alcohol play a role in this?: No Alcohol/Substance Abuse Treatment Hx: Denies past history Has alcohol/substance abuse ever caused legal  problems?: No  Social Support System:   Pensions consultant Support System: Bruce Yoder  Support System: daughter, a few friends Type of faith/religion: not currently  Leisure/Recreation:   Do You Have Hobbies?: Yes Leisure and Hobbies: reading, playing music (bass, mandolin, guitar) hiking  Strengths/Needs:   What is the patient's perception of their strengths?: intelligence, sense of humor, enjoy people Patient states they can use these personal strengths during their treatment to contribute to their recovery: enjoys people when he is feeling better Patient states these barriers may affect/interfere with their treatment: none Patient states these barriers may affect their return to the community: none Other important information patient would like considered in planning for their treatment: nothing  Discharge Plan:   Currently receiving community mental health services: Yes (From Whom) Bruce Yoder, [place], outpt therapy, medication through [place]: [place]) Patient states concerns and preferences for aftercare planning are: wants to continue with current outpt providers Patient states they will know when they are safe and ready for discharge when: dont' know for sure Does patient have access to transportation?: Yes Does patient have financial barriers related to discharge medications?: No Patient description of barriers related to discharge medications: worried about insurance benefits maxing out Will patient be returning to same living situation after discharge?: Yes  Summary/Recommendations:   Summary and Recommendations (to be completed by the evaluator): Pt is 77 year old male admitted after suicide attempt by intentional overdose.  Recommendations for pt include crisis stablization, therapeutic milieu, attend and participate in groups, medication management, and development of a comprehensive mental wellness plan.  Bruce Yoder. 04/04/2022

## 2022-04-05 DIAGNOSIS — F3163 Bipolar disorder, current episode mixed, severe, without psychotic features: Secondary | ICD-10-CM | POA: Diagnosis not present

## 2022-04-05 MED ORDER — LOSARTAN POTASSIUM 25 MG PO TABS
100.0000 mg | ORAL_TABLET | Freq: Every day | ORAL | Status: DC
  Administered 2022-04-06 – 2022-04-15 (×10): 100 mg via ORAL
  Filled 2022-04-05 (×10): qty 4

## 2022-04-05 MED ORDER — LORAZEPAM 0.5 MG PO TABS
0.5000 mg | ORAL_TABLET | ORAL | Status: DC | PRN
  Administered 2022-04-05 – 2022-04-10 (×3): 0.5 mg via ORAL
  Filled 2022-04-05 (×3): qty 1

## 2022-04-05 MED ORDER — RISPERIDONE 1 MG PO TABS
0.5000 mg | ORAL_TABLET | ORAL | Status: DC
  Administered 2022-04-05 – 2022-04-06 (×3): 0.5 mg via ORAL
  Filled 2022-04-05 (×3): qty 1

## 2022-04-05 MED ORDER — VENLAFAXINE HCL ER 75 MG PO CP24
150.0000 mg | ORAL_CAPSULE | Freq: Every day | ORAL | Status: DC
  Administered 2022-04-06 – 2022-04-07 (×2): 150 mg via ORAL
  Filled 2022-04-05 (×2): qty 2

## 2022-04-05 NOTE — Progress Notes (Signed)
Patient alert and oriented x4.  Endorses high levels of anxiety and feelings of depression and hopelessness. Denies SI/HI and AVH.  Denies pain. Compliant with scheduled medications.  15 min checks in place for safety.  Patient is pleasant and cooperative with staff.  Appropriate interaction with peers. Patient participated in both MHT and SW groups.    Patient given PRN medication for anxiety x1.

## 2022-04-05 NOTE — Progress Notes (Signed)
Patient alert and oriented x4.  Endorses high levels of anxiety and feelings of depression and hopelessness. Denies SI/HI and AVH.  Denies pain. Compliant with scheduled medications.  15 min checks in place for safety.  Patient is pleasant cooperative with staff.  Appropriate interaction with peers. Patient participated in both MHT and SW groups.

## 2022-04-05 NOTE — BHH Group Notes (Signed)
LCSW Wellness Group Note   04/05/2022 2:00pm  Type of Group and Topic: Psychoeducational Group:  Wellness  Participation Level:  Active  Description of Group  Wellness group introduces the topic and its focus on developing healthy habits across the spectrum and its relationship to a decrease in hospital admissions.  Six areas of wellness are discussed: physical, social spiritual, intellectual, occupational, and emotional.  Patients are asked to consider their current wellness habits and to identify areas of wellness where they are interested and able to focus on improvements.    Therapeutic Goals Patients will understand components of wellness and how they can positively impact overall health.  Patients will identify areas of wellness where they have developed good habits. Patients will identify areas of wellness where they would like to make improvements.    Summary of Patient Progress: very good participation in small, 2 member group.  Pt read aloud from the material and discussed his status in different wellness areas.  Pt acknowledged he was not doing many of the activities he had done previously, not as social, not playing with his music group.  Pt wants to start in those two areas as he tries to resume wellness/helpful activities moving forward.       Therapeutic Modalities: Cognitive Behavioral Therapy Psychoeducation    Joanne Chars, LCSW

## 2022-04-05 NOTE — Progress Notes (Signed)
[facility] MD Progress Note  04/05/2022 12:04 PM Bruce Yoder  MRN:  [id number] Subjective: Bruce Yoder is seen on rounds.  Yesterday I changed his medications and started him on Effexor and discontinued his Abilify. he says that he had some trouble sleeping last night.  He says that the Remeron usually helps him sleep.  He is very anxious and complains of anxiety.  He may change his Atarax Ativan as needed and give him a low-dose of Risperdal and increase Effexor. Principal Problem: Bipolar 1 disorder, mixed, severe ([place]) Diagnosis: Principal Problem:   Bipolar 1 disorder, mixed, severe (HCC) Active Problems:   Cluster B personality disorder in adult [facility])  Total Time spent with patient: 15 minutes  Past Psychiatric History:  He has 1 previous psychiatric admission at [facility] for suicidal ideation 20 years ago.  [facility] was [facility] and has been following up in outpatient with Dr. Reece Levy for the past couple weeks.  Recently took an overdose of Seroquel that was prescribed by his PCP.   Past Medical History:  Past Medical History:  Diagnosis Date   Atrial fibrillation ([place])    a. diagnosed on 10/22/2017; b. successful DCCV on 12/14/2017; Paroxysmal Persistent   Basal cell carcinoma (BCC) of back    Dr. Elvera Lennox   CAD (coronary artery disease)    a. LHC 01/31/2018: OM2 55-60%, FFR 0.93. pLAD 25%   CKD (chronic kidney disease) stage 3, GFR 30-59 ml/min (HCC)    Unilateral kidney   Dilated cardiomyopathy -related to A. fib RVR; resolved 10/2017   Thought to be related to A. fib RVR: a) echo 11/03/2017 showed LVEF of 35-40% with severe LVH --> follow-up echo January 2020 showed resolution with EF 55 to 60%.  Normal filling pressures.  Read as mild as opposed to severe LVH   Diverticulosis of colon    With polyps (Dr. Amedeo Plenty March 2017, followed by Dr. Glennon Hamilton)   Erectile dysfunction    Essential hypertension 2018   Gout    OSA on CPAP    [facility]   Status post nephrectomy 1965   Thrombocytopenia, unspecified ([place])    Platelet clumping (pseudothrombocytopenia)    Past Surgical History:  Procedure Laterality Date   APPENDECTOMY     During childhood   ATRIAL FIBRILLATION ABLATION N/A 10/11/2020   Procedure: ATRIAL FIBRILLATION ABLATION;  Surgeon: Constance Haw, MD;  Location: [place] CV [facility];  Service: Cardiovascular;  Laterality: N/A;   CARDIOVERSION N/A 12/14/2017   Procedure: CARDIOVERSION;  Surgeon: Sueanne Margarita, MD;  Location[facility] ENDOSCOPY;  Service: Cardiovascular;  Laterality: N/A;   CARDIOVERSION N/A 07/04/2020   Procedure: CARDIOVERSION;  Surgeon: Sueanne Margarita, MD;  Location: [place];  Service: Cardiovascular;  Laterality: N/A;   CYST EXCISION     face   INTRAVASCULAR PRESSURE WIRE/FFR STUDY N/A 01/31/2018   Procedure: INTRAVASCULAR PRESSURE WIRE/FFR STUDY;  Surgeon: Leonie Man, MD;  Location: [place] [facility];  Service: Cardiovascular;  Laterality: N/A;   LEFT HEART CATH AND CORONARY ANGIOGRAPHY N/A 01/31/2018   Procedure: LEFT HEART CATH AND CORONARY ANGIOGRAPHY;  Surgeon: Leonie Man, MD;  Location: [place] [facility];  Service: Cardiovascular: Single-vessel disease with roughly 60% lesion in major OM branch.  FFR negative.   NEPHRECTOMY Right 1965   TRANSTHORACIC ECHOCARDIOGRAM  10/2017    EF 35 -40% with severe LVH.  Diffuse global hypokinesis.  Mild aortic and mitral regurgitation.  Mild left and right  atrial dilation.   TRANSTHORACIC ECHOCARDIOGRAM  02/2018   EF 55-60%, normal filling pressure. Mod LA dilation.  Mild to moderate TR. -> resolution of cardiomyopathy   VASECTOMY  1989   Family History:  Family History  Problem Relation Age of Onset   Colon cancer Mother    Hypertension Father    Seizures Father    Stroke Father    Dementia Paternal Grandmother    Heart attack Paternal Grandfather    Family Psychiatric  History: Unremarkable Social History:   Social History   Substance and Sexual Activity  Alcohol Use Not Currently   Alcohol/week: 2.0 standard drinks of alcohol   Types: 1 Glasses of wine, 1 Cans of beer per week     Social History   Substance and Sexual Activity  Drug Use Never    Social History   Socioeconomic History   Marital status: Married    Spouse name: Not on file   Number of children: Not on file   Years of education: Not on file   Highest education level: Not on file  Occupational History   Not on file  Tobacco Use   Smoking status: Never   Smokeless tobacco: Never  Vaping Use   Vaping Use: Never used  Substance and Sexual Activity   Alcohol use: Not Currently    Alcohol/week: 2.0 standard drinks of alcohol    Types: 1 Glasses of wine, 1 Cans of beer per week   Drug use: Never   Sexual activity: Not on file  Other Topics Concern   Not on file  Social History Narrative   He is married now for 4 years (remarried).  He has 1 child (presumably from previous marriage -75 years old).   He lives with his wife.  He is an avid Chief Executive Officer.   He is a retired Producer, television/film/video for [employer].  (He has a BA in American studies at [employer].)   Never smoked.  Drinks 5-7 alcoholic beverages a week usually beer or hard cider.  May be occasionally a mixed drink.   He is quite active exercise at least 4 days a week for 30 minutes at a time.       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  He enjoys walking on both treadmill and in the community.  He plays golf routinely.   Social Determinants of Health   Financial Resource Strain: Not on file  Food Insecurity: No Food Insecurity (04/04/2022)   Hunger Vital Sign    Worried About Running Out of Food in the Last Year: Never true    Ran Out of Food in the Last Year: Never true  Transportation Needs: No Transportation Needs (04/04/2022)   PRAPARE - Armed forces logistics/support/administrative officer (Medical): No    Lack of Transportation (Non-Medical): No  Physical Activity: Not on file  Stress: Not on file  Social Connections: Not on file   Additional Social History:                         Sleep: Fair  Appetite:  Good  Current Medications: Current Facility-Administered Medications  Medication Dose Route Frequency Provider Last Rate Last Admin   acetaminophen (TYLENOL) tablet 1,000 mg  1,000 mg Oral Q6H PRN Deloria Lair, NP       allopurinol (ZYLOPRIM) tablet 150 mg  150 mg Oral  Daily Deloria Lair, NP   150 mg at 04/05/22 0949   alum & mag hydroxide-simeth (MAALOX/MYLANTA) 200-200-20 MG/5ML suspension 30 mL  30 mL Oral Q4H PRN Deloria Lair, NP       atorvastatin (LIPITOR) tablet 40 mg  40 mg Oral Daily Doren Custard, Rashaun M, NP   40 mg at 04/05/22 [id number]   LORazepam (ATIVAN) tablet 0.5 mg  0.5 mg Oral Q4H PRN Parks Ranger, DO   0.5 mg at 04/05/22 1129   [START ON 04/06/2022] losartan (COZAAR) tablet 100 mg  100 mg Oral Daily Parks Ranger, DO       magnesium hydroxide (MILK OF MAGNESIA) suspension 30 mL  30 mL Oral Daily PRN Deloria Lair, NP       metoprolol succinate (TOPROL-XL) 24 hr tablet 50 mg  50 mg Oral Daily Doren Custard, Rashaun M, NP   50 mg at 04/05/22 0949   mirtazapine (REMERON) tablet 15 mg  15 mg Oral QHS Deloria Lair, NP   15 mg at 04/04/22 2101   multivitamin (RENA-VIT) tablet 1 tablet  1 tablet Oral QHS Deloria Lair, NP   1 tablet at 04/04/22 2101   risperiDONE (RISPERDAL) tablet 0.5 mg  0.5 mg Oral [id number] Parks Ranger, DO   0.5 mg at 04/05/22 1129   rivaroxaban (XARELTO) tablet 20 mg  20 mg Oral Q supper Anette Riedel M, NP   20 mg at 04/04/22 1634   traZODone (DESYREL) tablet 50 mg  50 mg Oral QHS PRN Deloria Lair, NP       [START ON 04/06/2022] venlafaxine XR (EFFEXOR-XR) 24 hr capsule 150 mg  150 mg Oral QPC breakfast Parks Ranger, DO        Lab Results: No  results found for this or any previous visit (from the past 48 hour(s)).  Blood Alcohol level:  Lab Results  Component Value Date   ETH <10 [id number]    Metabolic Disorder Labs: No results found for: "HGBA1C", "MPG" No results found for: "PROLACTIN" Lab Results  Component Value Date   CHOL 142 02/01/2018   TRIG 105 02/01/2018   HDL 34 (L) 02/01/2018   CHOLHDL 4.2 02/01/2018   VLDL 21 02/01/2018   LDLCALC 87 02/01/2018    Physical Findings: AIMS:  , ,  ,  ,    CIWA:    COWS:     Musculoskeletal: Strength & Muscle Tone: within normal limits Gait & Station: normal Patient leans: N/A  Psychiatric Specialty Exam:  Presentation  General Appearance:  Appropriate for Environment  Eye Contact: Fair  Speech: Slurred  Speech Volume: Normal  Handedness: Right   Mood and Affect  Mood: Hopeless  Affect: Depressed; Flat   Thought Process  Thought Processes: Coherent  Descriptions of Associations:Intact  Orientation:Full (Time, Place and Person)  Thought Content:WDL  History of Schizophrenia/Schizoaffective disorder:No data recorded Duration of Psychotic Symptoms:No data recorded Hallucinations:No data recorded Ideas of Reference:None  Suicidal Thoughts:No data recorded Homicidal Thoughts:No data recorded  Sensorium  Memory: Immediate Good; Remote Good; Recent Good  Judgment: Fair  Insight: Good   Executive Functions  Concentration: Good  Attention Span: Good  Recall: Good  Fund of Knowledge: Good  Language: Good   Psychomotor Activity  Psychomotor Activity:No data recorded  Assets  Assets: Communication Skills; Financial Resources/Insurance; Housing   Sleep  Sleep:No data recorded    Blood pressure (!) 156/96, pulse 66, temperature 97.6 F (36.4 C), temperature source Oral, resp. rate 18,  height 5' 10"$  (1.778 m), weight 104.3 kg, SpO2 98 %. Body mass index is 33 kg/m.   Treatment Plan Summary: Daily contact  with patient to assess and evaluate symptoms and progress in treatment, Medication management, and Plan increase Effexor XR.  Change Atarax to Ativan.  Start Risperdal 0.5 mg twice a day.  Parks Ranger, DO 04/05/2022, 12:04 PM

## 2022-04-05 NOTE — Progress Notes (Signed)
Patient A&Ox4. Patient denies SI/Hi and AVH. Denies any physical complaints when asked. No acute distress noted. Support and encouragement provided. Routine safety checks conducted according to facility protocol. Encouraged patient to notify staff if thoughts of harm toward self or others arise. Patient verbalize understanding and agreement. Will continue to monitor for safety

## 2022-04-05 NOTE — Progress Notes (Signed)
   04/05/22 0100  Psych Admission Type (Psych Patients Only)  Admission Status Involuntary  Psychosocial Assessment  Patient Complaints Anxiety;Depression  Eye Contact Fair  Facial Expression Flat;Sad  Affect Anxious;Sad  Speech Logical/coherent  Interaction Assertive  Motor Activity Slow  Appearance/Hygiene Unremarkable  Behavior Characteristics Cooperative;Appropriate to situation  Mood Depressed;Anxious  Thought Process  Coherency WDL  Content WDL  Delusions None reported or observed  Perception WDL  Hallucination None reported or observed  Judgment Impaired  Confusion None  Danger to Self  Current suicidal ideation? Denies  Danger to Others  Danger to Others None reported or observed

## 2022-04-05 NOTE — BHH Group Notes (Signed)
Patient attended group and engaged in healthy support system group discussion [place] Group Notes:  (Nursing/MHT/Case Management/Adjunct)  Date:  04/06/2022  Time:  12:48 AM  Type of Therapy:  Group Therapy  Participation Level:  Active  Participation Quality:  Attentive  Affect:  Excited  Cognitive:  Appropriate  Insight:  Good  Engagement in Group:  Engaged  Modes of Intervention:  Support  Summary of Progress/Problems:  Bradd Canary 04/06/2022, 12:48 AM

## 2022-04-05 NOTE — [facility] Suicide Risk Assessment (Signed)
[place] INPATIENT:  Family/Significant Other Suicide Prevention Education  Suicide Prevention Education:  Education Completed; Dalmer Dolinski, daughter, [phone], has been identified by the patient as the family member/significant other with whom the patient will be residing, and identified as the person(s) who will aid the patient in the event of a mental health crisis (suicidal ideations/suicide attempt).  With written consent from the patient, the family member/significant other has been provided the following suicide prevention education, prior to the and/or following the discharge of the patient.  The suicide prevention education provided includes the following: Suicide risk factors Suicide prevention and interventions National Suicide Hotline telephone number [facility] assessment telephone number [facility] Emergency Assistance [place] and/or Residential Mobile Crisis Unit telephone number  Request made of family/significant other to: Remove weapons (e.g., guns, rifles, knives), all items previously/currently identified as safety concern.  No guns in home, per [facility] drugs/medications (over-the-counter, prescriptions, illicit drugs), all items previously/currently identified as a safety concern.  [name] helps organize weekly medications for pt.    The family member/significant other verbalizes understanding of the suicide prevention education information provided.  The family member/significant other agrees to remove the items of safety concern listed above.  [name] reports that pt has been extremely fearful since returning to the area around last thanksgiving.  They met with [facility] staff who was talking about possible residential placement.  Pt therapist, Randal Buba, seems like a good fit, but pt has only seen him a few times due to returning to inpatient.  [name] concerned that 1-2x per week outpt therapy is not adequate currently.   [name] trying to be supportive, feels somewhat overwhelmed as well.  Out of town currently, will be back Monday evening.    Joanne Chars 04/05/2022, 10:54 AM

## 2022-04-05 NOTE — Plan of Care (Signed)
  Problem: Education: Goal: Knowledge of the prescribed therapeutic regimen will improve Outcome: Progressing   Problem: Activity: Goal: Interest or engagement in leisure activities will improve Outcome: Progressing   Problem: Safety: Goal: Ability to disclose and discuss suicidal ideas will improve Outcome: Progressing   Problem: Self-Concept: Goal: Level of anxiety will decrease Outcome: Not Progressing

## 2022-04-06 DIAGNOSIS — F3163 Bipolar disorder, current episode mixed, severe, without psychotic features: Secondary | ICD-10-CM | POA: Diagnosis not present

## 2022-04-06 MED ORDER — HYDROCHLOROTHIAZIDE 25 MG PO TABS
25.0000 mg | ORAL_TABLET | Freq: Every day | ORAL | Status: DC
  Administered 2022-04-06 – 2022-04-15 (×10): 25 mg via ORAL
  Filled 2022-04-06 (×10): qty 1

## 2022-04-06 MED ORDER — RISPERIDONE 1 MG PO TABS
1.0000 mg | ORAL_TABLET | ORAL | Status: DC
  Administered 2022-04-06 – 2022-04-07 (×2): 1 mg via ORAL
  Filled 2022-04-06 (×2): qty 1

## 2022-04-06 MED ORDER — DOXEPIN HCL 50 MG PO CAPS
50.0000 mg | ORAL_CAPSULE | Freq: Every day | ORAL | Status: DC
  Administered 2022-04-06: 50 mg via ORAL
  Filled 2022-04-06: qty 1

## 2022-04-06 NOTE — Progress Notes (Signed)
Patient is alert and oriented x 4.  Patient is pleasant and cooperative with appropriate  affect. Patient endorses 8/10 anxiety and 6/10 depression.  Denies SI/HI and AVH.  Denies pain. PRN medication given this morning for anxiety.  Compliant with scheduled medications.  15 min checks in place for safety.  Patient attended SW and recreational therapy groups.

## 2022-04-06 NOTE — Group Note (Signed)
Date:  04/05/22 Time:  2045  Group Topic/Focus:   Healthy Communication:   The focus of this group is to discuss communication, barriers to communication, as well as healthy ways to communicate with others. Identifying Needs:   The focus of this group is to help patients identify their personal needs that have been historically problematic and identify healthy behaviors to address their needs. Making Healthy Choices:   The focus of this group is to help patients identify negative/unhealthy choices they were using prior to admission and identify positive/healthier coping strategies to replace them upon discharge. Managing Feelings:   The focus of this group is to identify what feelings patients have difficulty handling and develop a plan to handle them in a healthier way upon discharge. Personal Choices and Values:   The focus of this group is to help patients assess and explore the importance of values in their lives, how their values affect their decisions, how they express their values and what opposes their expression. Self Care:   The focus of this group is to help patients understand the importance of self-care in order to improve or restore emotional, physical, spiritual, interpersonal, and financial health. Self Esteem Action Plan:   The focus of this group is to help patients create a plan to continue to build self-esteem after discharge.    Participation Level:  Active  Participation Quality:  Appropriate  Affect:  Appropriate  Cognitive:  Alert  Insight: Appropriate  Engagement in Group:  Engaged  Modes of Intervention:  Discussion  Additional Comments:    Ileene Musa 04/06/2022, 12:23 AM

## 2022-04-06 NOTE — Group Note (Signed)
Recreation Therapy Group Note   Group Topic:Leisure Education  Group Date: 04/06/2022 Start Time: 1400 End Time: 1445 Facilitators: Vilma Prader, LRT, CTRS  Location:  Dayroom  Group Description: Patients encouraged to name their favorite song(s) for LRT to play song through speaker for group to hear. Patient educated on the definition of leisure and the importance of having different leisure interests outside of the hospital. Group discussed how leisure activities can often be used as Radiographer, therapeutic and that listening to music is one example.   Affect/Mood: Appropriate and Euthymic   Participation Level: Active and Engaged   Participation Quality: Independent   Behavior: Alert, Appropriate, and Calm   Speech/Thought Process: Directed, Logical, and Organized   Insight: Good   Judgement: Good   Modes of Intervention: Education and Music   Patient Response to Interventions:  Attentive, Engaged, and Interested    Education Outcome:  Verbalizes understanding   Clinical Observations/Individualized Feedback: Bruce Yoder was active in their participation of session activities and group discussion. Pt identified "walking, playing music, and doing different sports" as activities he enjoys doing in his free time. Pt was singing and smiling throughout session while tapping his foot or hands to the beat of the music.    Plan: Continue to engage patient in RT group sessions 2-3x/week.   Vilma Prader, LRT, CTRS  04/06/2022 2:58 PM

## 2022-04-06 NOTE — Progress Notes (Signed)
[facility] MD Progress Note  04/06/2022 10:57 AM Bruce Yoder  MRN:  [id number] Subjective: Bruce Yoder is seen on rounds.  He is having trouble sleeping at night.  He states that the Remeron helps him sleep for a few hours but then wakes up.  He seems to have a high tolerance.  No drowsiness on the new Risperdal in the daytime.  Still has a lot of negative thoughts, racing thoughts and anxiety.  He denies any suicidal ideation. Principal Problem: Bipolar 1 disorder, mixed, severe ([place]) Diagnosis: Principal Problem:   Bipolar 1 disorder, mixed, severe (HCC) Active Problems:   Cluster B personality disorder in adult [facility])  Total Time spent with patient: 15 minutes  Past Psychiatric History:  He has 1 previous psychiatric admission at [facility] for suicidal ideation 20 years ago.  He was recently discharged from [facility] and has been following up in outpatient with Dr. Reece Levy for the past couple weeks.  Recently took an overdose of Seroquel that was prescribed by his PCP.    Past Medical History:  Past Medical History:  Diagnosis Date   Atrial fibrillation ([place])    a. diagnosed on 10/22/2017; b. successful DCCV on 12/14/2017; Paroxysmal Persistent   Basal cell carcinoma (BCC) of back    Dr. Elvera Lennox   CAD (coronary artery disease)    a. LHC 01/31/2018: OM2 55-60%, FFR 0.93. pLAD 25%   CKD (chronic kidney disease) stage 3, GFR 30-59 ml/min (HCC)    Unilateral kidney   Dilated cardiomyopathy -related to A. fib RVR; resolved 10/2017   Thought to be related to A. fib RVR: a) echo 11/03/2017 showed LVEF of 35-40% with severe LVH --> follow-up echo January 2020 showed resolution with EF 55 to 60%.  Normal filling pressures.  Read as mild as opposed to severe LVH   Diverticulosis of colon    With polyps (Dr. Amedeo Plenty March 2017, followed by Dr. Glennon Hamilton)   Erectile dysfunction    Essential hypertension 2018   Gout    OSA on CPAP    [facility]   Status post  nephrectomy 1965   Thrombocytopenia, unspecified ([place])    Platelet clumping (pseudothrombocytopenia)    Past Surgical History:  Procedure Laterality Date   APPENDECTOMY     During childhood   ATRIAL FIBRILLATION ABLATION N/A 10/11/2020   Procedure: ATRIAL FIBRILLATION ABLATION;  Surgeon: Constance Haw, MD;  Location: [place] [facility];  Service: Cardiovascular;  Laterality: N/A;   CARDIOVERSION N/A 12/14/2017   Procedure: CARDIOVERSION;  Surgeon: Sueanne Margarita, MD;  Location: [facility] END[facility];  Service: Cardiovascular;  Laterality: N/A;   CARDIOVERSION N/A 07/04/2020   Procedure: CARDIOVERSION;  Surgeon: Sueanne Margarita, MD;  Location: [place];  Service: Cardiovascular;  Laterality: N/A;   CYST EXCISION     face   INTRAVASCULAR PRESSURE WIRE/FFR STUDY N/A 01/31/2018   Procedure: INTRAVASCULAR PRESSURE WIRE/FFR STUDY;  Surgeon: Leonie Man, MD;  Location: [place] [facility];  Service: Cardiovascular;  Laterality: N/A;   LEFT HEART CATH AND CORONARY ANGIOGRAPHY N/A 01/31/2018   Procedure: LEFT HEART CATH AND CORONARY ANGIOGRAPHY;  Surgeon: Leonie Man, MD;  Location: [place] [facility];  Service: Cardiovascular: Single-vessel disease with roughly 60% lesion in major OM branch.  FFR negative.   NEPHRECTOMY Right 1965   TRANSTHORACIC ECHOCARDIOGRAM  10/2017    EF 35 -40% with severe LVH.  Diffuse global hypokinesis.  Mild aortic and mitral regurgitation.  Mild left and right atrial  dilation.   TRANSTHORACIC ECHOCARDIOGRAM  02/2018   EF 55-60%, normal filling pressure. Mod LA dilation.  Mild to moderate TR. -> resolution of cardiomyopathy   VASECTOMY  1989   Family History:  Family History  Problem Relation Age of Onset   Colon cancer Mother    Hypertension Father    Seizures Father    Stroke Father    Dementia Paternal Grandmother    Heart attack Paternal Grandfather    Family Psychiatric  History: Unremarkable Social History:  Social History   Substance and  Sexual Activity  Alcohol Use Not Currently   Alcohol/week: 2.0 standard drinks of alcohol   Types: 1 Glasses of wine, 1 Cans of beer per week     Social History   Substance and Sexual Activity  Drug Use Never    Social History   Socioeconomic History   Marital status: Married    Spouse name: Not on file   Number of children: Not on file   Years of education: Not on file   Highest education level: Not on file  Occupational History   Not on file  Tobacco Use   Smoking status: Never   Smokeless tobacco: Never  Vaping Use   Vaping Use: Never used  Substance and Sexual Activity   Alcohol use: Not Currently    Alcohol/week: 2.0 standard drinks of alcohol    Types: 1 Glasses of wine, 1 Cans of beer per week   Drug use: Never   Sexual activity: Not on file  Other Topics Concern   Not on file  Social History Narrative   He is married now for 4 years (remarried).  He has 1 child (presumably from previous marriage -75 years old).   He lives with his wife.  He is an avid Chief Executive Officer.   He is a retired Producer, television/film/video for [employer].  (He has a BA in American studies at [employer].)   Never smoked.  Drinks 5-7 alcoholic beverages a week usually beer or hard cider.  May be occasionally a mixed drink.   He is quite active exercise at least 4 days a week for 30 minutes at a time.       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  He enjoys walking on both treadmill and in the community.  He plays golf routinely.   Social Determinants of Health   Financial Resource Strain: Not on file  Food Insecurity: No Food Insecurity (04/04/2022)   Hunger Vital Sign    Worried About Running Out of Food in the Last Year: Never true    Ran Out of Food in the Last Year: Never true  Transportation Needs: No Transportation Needs (04/04/2022)   PRAPARE - Hydrologist  (Medical): No    Lack of Transportation (Non-Medical): No  Physical Activity: Not on file  Stress: Not on file  Social Connections: Not on file   Additional Social History:                         Sleep: Poor  Appetite:  Fair  Current Medications: Current Facility-Administered Medications  Medication Dose Route Frequency Provider Last Rate Last Admin   acetaminophen (TYLENOL) tablet 1,000 mg  1,000 mg Oral Q6H PRN Deloria Lair, NP       allopurinol (ZYLOPRIM) tablet 150 mg  150 mg Oral Daily  Anette Riedel M, NP   150 mg at 04/06/22 0921   alum & mag hydroxide-simeth (MAALOX/MYLANTA) 200-200-20 MG/5ML suspension 30 mL  30 mL Oral Q4H PRN Deloria Lair, NP       atorvastatin (LIPITOR) tablet 40 mg  40 mg Oral Daily Dixon, Rashaun M, NP   40 mg at 04/06/22 [id number]   LORazepam (ATIVAN) tablet 0.5 mg  0.5 mg Oral Q4H PRN Parks Ranger, DO   0.5 mg at 04/06/22 0933   losartan (COZAAR) tablet 100 mg  100 mg Oral Daily Parks Ranger, DO   100 mg at 04/06/22 [id number]   magnesium hydroxide (MILK OF MAGNESIA) suspension 30 mL  30 mL Oral Daily PRN Deloria Lair, NP       metoprolol succinate (TOPROL-XL) 24 hr tablet 50 mg  50 mg Oral Daily Dixon, Rashaun M, NP   50 mg at 04/06/22 0918   mirtazapine (REMERON) tablet 15 mg  15 mg Oral QHS Deloria Lair, NP   15 mg at 04/05/22 2228   multivitamin (RENA-VIT) tablet 1 tablet  1 tablet Oral QHS Deloria Lair, NP   1 tablet at 04/05/22 2228   risperiDONE (RISPERDAL) tablet 0.5 mg  0.5 mg Oral [id number] Parks Ranger, DO   0.5 mg at 04/06/22 [id number]   rivaroxaban (XARELTO) tablet 20 mg  20 mg Oral Q supper Anette Riedel M, NP   20 mg at 04/05/22 1720   traZODone (DESYREL) tablet 50 mg  50 mg Oral QHS PRN Deloria Lair, NP       venlafaxine XR (EFFEXOR-XR) 24 hr capsule 150 mg  150 mg Oral QPC breakfast Parks Ranger, DO   150 mg at 04/06/22 [id number]    Lab Results: No results found for this or any  previous visit (from the past 48 hour(s)).  Blood Alcohol level:  Lab Results  Component Value Date   ETH <10 [id number]    Metabolic Disorder Labs: No results found for: "HGBA1C", "MPG" No results found for: "PROLACTIN" Lab Results  Component Value Date   CHOL 142 02/01/2018   TRIG 105 02/01/2018   HDL 34 (L) 02/01/2018   CHOLHDL 4.2 02/01/2018   VLDL 21 02/01/2018   LDLCALC 87 02/01/2018    Physical Findings: AIMS:  , ,  ,  ,    CIWA:    COWS:     Musculoskeletal: Strength & Muscle Tone: within normal limits Gait & Station: normal Patient leans: N/A  Psychiatric Specialty Exam:  Presentation  General Appearance:  Appropriate for Environment  Eye Contact: Fair  Speech: Slurred  Speech Volume: Normal  Handedness: Right   Mood and Affect  Mood: Hopeless  Affect: Depressed; Flat   Thought Process  Thought Processes: Coherent  Descriptions of Associations:Intact  Orientation:Full (Time, Place and Person)  Thought Content:WDL  History of Schizophrenia/Schizoaffective disorder:No data recorded Duration of Psychotic Symptoms:No data recorded Hallucinations:No data recorded Ideas of Reference:None  Suicidal Thoughts:No data recorded Homicidal Thoughts:No data recorded  Sensorium  Memory: Immediate Good; Remote Good; Recent Good  Judgment: Fair  Insight: Good   Executive Functions  Concentration: Good  Attention Span: Good  Recall: Good  Fund of Knowledge: Good  Language: Good   Psychomotor Activity  Psychomotor Activity:No data recorded  Assets  Assets: Communication Skills; Financial Resources/Insurance; Housing   Sleep  Sleep:No data recorded   Physical Exam: Physical Exam Vitals and nursing note reviewed.  Constitutional:      Appearance: Normal appearance.  He is normal weight.  Neurological:     General: No focal deficit present.     Mental Status: He is alert and oriented to person, place, and  time.  Psychiatric:        Attention and Perception: Attention and perception normal.        Mood and Affect: Mood is depressed. Affect is flat.        Speech: Speech normal.        Behavior: Behavior normal. Behavior is cooperative.        Thought Content: Thought content normal.        Cognition and Memory: Cognition and memory normal.        Judgment: Judgment normal.    Review of Systems  Constitutional: Negative.   HENT: Negative.    Eyes: Negative.   Respiratory: Negative.    Cardiovascular: Negative.   Gastrointestinal: Negative.   Genitourinary: Negative.   Musculoskeletal: Negative.   Skin: Negative.   Neurological: Negative.   Endo/Heme/Allergies: Negative.   Psychiatric/Behavioral:  Positive for depression. The patient has insomnia.    Blood pressure (!) 147/91, pulse 70, temperature (!) 97.5 F (36.4 C), temperature source Oral, resp. rate 20, height 5' 10"$  (1.778 m), weight 104.3 kg, SpO2 97 %. Body mass index is 33 kg/m.   Treatment Plan Summary: Daily contact with patient to assess and evaluate symptoms and progress in treatment, Medication management, and Plan increase Risperdal to 1 mg twice a day.  Discontinue Remeron and start doxepin 50 mg at bedtime.  Start hydrochlorothiazide with the losartan.  Parks Ranger, DO 04/06/2022, 10:57 AM

## 2022-04-06 NOTE — Plan of Care (Signed)
  Problem: Education: Goal: Knowledge of General Education information will improve Description: Including pain rating scale, medication(s)/side effects and non-pharmacologic comfort measures Outcome: Progressing   Problem: Nutrition: Goal: Adequate nutrition will be maintained Outcome: Progressing   Problem: Education: Goal: Knowledge of  General Education information/materials will improve Outcome: Progressing   Problem: Coping: Goal: Level of anxiety will decrease Outcome: Not Progressing

## 2022-04-06 NOTE — Group Note (Deleted)
Date:  04/06/2022 Time:  12:43 AM  Group Topic/Focus:  Number of Participants: 3  Group Focus: anxiety Treatment Modality:  Art Therapy Interventions utilized were confrontation Purpose: regain self-worth     Participation Level:  {BHH PARTICIPATION WO:[id number]  Participation Quality:  {BHH PARTICIPATION QUALITY:22265}  Affect:  {B[facility] AFFECT:22266}  Cognitive:  {B[facility] COGNITIVE:22267}  Insight: {BHH Insight2:20797}  Engagement in Group:  {B[facility] ENGAGEMENT IN BP:8198245  Modes of Intervention:  {BHH MODES OF INTERVENTION:22269}  Additional Comments:  ***  Ileene Musa 04/06/2022, 12:43 AM

## 2022-04-06 NOTE — Group Note (Signed)
Date:  04/06/2022 Time:  8:48 PM  Group Topic/Focus:  Self Care:   The focus of this group is to help patients understand the importance of self-care in order to improve or restore emotional, physical, spiritual, interpersonal, and financial health.    Participation Level:  Active  Participation Quality:  Attentive  Affect:  Excited  Cognitive:  Alert  Insight: Appropriate  Engagement in Group:  Engaged  Modes of Intervention:  Discussion  Additional Comments:    Neville Route 04/06/2022, 8:48 PM

## 2022-04-06 NOTE — BH IP Treatment Plan (Unsigned)
Interdisciplinary Treatment and Diagnostic Plan Update  04/06/2022 Time of Session: 10:00AM KALMAN LOUALLEN MRN: [id number]  Principal Diagnosis: Bipolar 1 disorder, mixed, severe ([place])  Secondary Diagnoses: Principal Problem:   Bipolar 1 disorder, mixed, severe ([place]) Active Problems:   Cluster B personality disorder in adult [facility])   Current Medications:  Current Facility-Administered Medications  Medication Dose Route Frequency Provider Last Rate Last Admin   acetaminophen (TYLENOL) tablet 1,000 mg  1,000 mg Oral Q6H PRN Deloria Lair, NP       allopurinol (ZYLOPRIM) tablet 150 mg  150 mg Oral Daily Doren Custard, Rashaun M, NP   150 mg at 04/06/22 0921   alum & mag hydroxide-simeth (MAALOX/MYLANTA) 200-200-20 MG/5ML suspension 30 mL  30 mL Oral Q4H PRN Deloria Lair, NP       atorvastatin (LIPITOR) tablet 40 mg  40 mg Oral Daily Doren Custard, Rashaun M, NP   40 mg at 04/06/22 0917   doxepin (SINEQUAN) capsule 50 mg  50 mg Oral QHS Parks Ranger, DO       hydrochlorothiazide (HYDRODIURIL) tablet 25 mg  25 mg Oral Daily Parks Ranger, DO       LORazepam (ATIVAN) tablet 0.5 mg  0.5 mg Oral Q4H PRN Parks Ranger, DO   0.5 mg at 04/06/22 0933   losartan (COZAAR) tablet 100 mg  100 mg Oral Daily Parks Ranger, DO   100 mg at 04/06/22 [id number]   magnesium hydroxide (MILK OF MAGNESIA) suspension 30 mL  30 mL Oral Daily PRN Deloria Lair, NP       metoprolol succinate (TOPROL-XL) 24 hr tablet 50 mg  50 mg Oral Daily Doren Custard, Rashaun M, NP   50 mg at 04/06/22 [id number]   multivitamin (RENA-VIT) tablet 1 tablet  1 tablet Oral QHS Deloria Lair, NP   1 tablet at 04/05/22 2228   risperiDONE (RISPERDAL) tablet 1 mg  1 mg Oral [id number] Herrick, Richard Edward, DO       rivaroxaban Alveda Reasons) tablet 20 mg  20 mg Oral Q supper Doren Custard, Rashaun M, NP   20 mg at 04/05/22 1720   traZODone (DESYREL) tablet 50 mg  50 mg Oral QHS PRN Deloria Lair, NP       venlafaxine XR  (EFFEXOR-XR) 24 hr capsule 150 mg  150 mg Oral QPC breakfast Parks Ranger, DO   150 mg at 04/06/22 [id number]   PTA Medications: Medications Prior to Admission  Medication Sig Dispense Refill Last Dose   acetaminophen (TYLENOL) 500 MG tablet Take 1,000 mg by mouth every 6 (six) hours as needed for moderate pain.      allopurinol (ZYLOPRIM) 300 MG tablet Take 150 mg by mouth daily.       ARIPiprazole (ABILIFY) 2 MG tablet Take 2 mg by mouth daily.      atorvastatin (LIPITOR) 40 MG tablet Take 1 tablet (40 mg total) by mouth daily. 90 tablet 3    b complex-vitamin c-folic acid (NEPHRO-VITE) 0.8 MG TABS tablet Take 1 tablet by mouth 2 (two) times daily.      clonazePAM (KLONOPIN) 0.5 MG tablet Take 0.5 mg by mouth 2 (two) times daily as needed for anxiety.      hydrOXYzine (ATARAX) 25 MG tablet Take 25 mg by mouth 2 (two) times daily as needed for anxiety.      loratadine (CLARITIN) 10 MG tablet Take 10 mg by mouth daily as needed for allergies.      losartan (COZAAR) 50  MG tablet Take 1 tablet (50 mg total) by mouth daily. 90 tablet 3    metoprolol succinate (TOPROL-XL) 50 MG 24 hr tablet TAKE 1 TABLET BY MOUTH ONCE DAILY WITH MEALS 90 tablet 3    mirtazapine (REMERON) 15 MG tablet Take 15 mg by mouth at bedtime.      QUEtiapine (SEROQUEL) 50 MG tablet Take 50 mg by mouth at bedtime.      sertraline (ZOLOFT) 50 MG tablet Take 150 mg by mouth daily.      XARELTO 20 MG TABS tablet TAKE 1 TABLET DAILY WITH SUPPER. (Patient taking differently: Take 20 mg by mouth daily with supper.) 90 tablet 1     Patient Stressors: Health problems   Marital or family conflict    Patient Strengths: Average or above average intelligence  Capable of independent living   Treatment Modalities: Medication Management, Group therapy, Case management,  1 to 1 session with clinician, Psychoeducation, Recreational therapy.   Physician Treatment Plan for Primary Diagnosis: Bipolar 1 disorder, mixed, severe  ([place]) Long Term Goal(s): Improvement in symptoms so as ready for discharge   Short Term Goals: Ability to identify changes in lifestyle to reduce recurrence of condition will improve Ability to verbalize feelings will improve Ability to disclose and discuss suicidal ideas Ability to demonstrate self-control will improve Ability to identify and develop effective coping behaviors will improve Ability to maintain clinical measurements within normal limits will improve Compliance with prescribed medications will improve Ability to identify triggers associated with substance abuse/mental health issues will improve  Medication Management: Evaluate patient's response, side effects, and tolerance of medication regimen.  Therapeutic Interventions: 1 to 1 sessions, Unit Group sessions and Medication administration.  Evaluation of Outcomes: Not Met  Physician Treatment Plan for Secondary Diagnosis: Principal Problem:   Bipolar 1 disorder, mixed, severe ([place]) Active Problems:   Cluster B personality disorder in adult [facility])  Long Term Goal(s): Improvement in symptoms so as ready for discharge   Short Term Goals: Ability to identify changes in lifestyle to reduce recurrence of condition will improve Ability to verbalize feelings will improve Ability to disclose and discuss suicidal ideas Ability to demonstrate self-control will improve Ability to identify and develop effective coping behaviors will improve Ability to maintain clinical measurements within normal limits will improve Compliance with prescribed medications will improve Ability to identify triggers associated with substance abuse/mental health issues will improve     Medication Management: Evaluate patient's response, side effects, and tolerance of medication regimen.  Therapeutic Interventions: 1 to 1 sessions, Unit Group sessions and Medication administration.  Evaluation of Outcomes: Not Met   RN Treatment Plan for Primary  Diagnosis: Bipolar 1 disorder, mixed, severe ([place]) Long Term Goal(s): Knowledge of disease and therapeutic regimen to maintain health will improve  Short Term Goals: Ability to remain free from injury will improve, Ability to verbalize frustration and anger appropriately will improve, Ability to demonstrate self-control, Ability to participate in decision making will improve, Ability to verbalize feelings will improve, Ability to disclose and discuss suicidal ideas, Ability to identify and develop effective coping behaviors will improve, and Compliance with prescribed medications will improve  Medication Management: RN will administer medications as ordered by provider, will assess and evaluate patient's response and provide education to patient for prescribed medication. RN will report any adverse and/or side effects to prescribing provider.  Therapeutic Interventions: 1 on 1 counseling sessions, Psychoeducation, Medication administration, Evaluate responses to treatment, Monitor vital signs and CBGs as ordered, Perform/monitor CIWA, COWS,  AIMS and Fall Risk screenings as ordered, Perform wound care treatments as ordered.  Evaluation of Outcomes: Not Met   LCSW Treatment Plan for Primary Diagnosis: Bipolar 1 disorder, mixed, severe ([employer]) Long Term Goal(s): Safe transition to appropriate next level of care at discharge, Engage patient in therapeutic group addressing interpersonal concerns.  Short Term Goals: Engage patient in aftercare planning with referrals and resources, Increase social support, Increase ability to appropriately verbalize feelings, Increase emotional regulation, Facilitate acceptance of mental health diagnosis and concerns, and Increase skills for wellness and recovery  Therapeutic Interventions: Assess for all discharge needs, 1 to 1 time with Social worker, Explore available resources and support systems, Assess for adequacy in community support network, Educate family and  significant other(s) on suicide prevention, Complete Psychosocial Assessment, Interpersonal group therapy.  Evaluation of Outcomes: Not Met   Progress in Treatment: Attending groups: Yes. Participating in groups: Yes. Taking medication as prescribed: Yes. Toleration medication: Yes. Family/Significant other contact made: Yes, individual(s) contacted:  Pt's daughter, Deshaun Tramell Patient understands diagnosis: Yes. Discussing patient identified problems/goals with staff: Yes. Medical problems stabilized or resolved: Yes. Denies suicidal/homicidal ideation: Yes. Issues/concerns per patient self-inventory: No. Other: None  New problem(s) identified: No, Describe:  None  New Short Term/Long Term Goal(s): Patient to work towards detox, medication management for mood stabilization; elimination of SI thoughts; development of comprehensive mental wellness plan.   Patient Goals: "overwhelmed"  Discharge Plan or Barriers: CSW will assist pt with development of appropriate discharge/after care plan  Reason for Continuation of Hospitalization: Anxiety Depression Medication stabilization  Estimated Length of Stay: 1-7 days  Last 3 [place] Suicide Severity Risk Score: Flowsheet Row Admission (Current) from 04/04/2022 in [place] ED from 04/02/2022 in [facility] Emergency Department at [facility] Admission (Discharged) from 10/11/2020 in [place] CATH LAB  C-SSRS RISK CATEGORY Moderate Risk High Risk No Risk       Last PHQ 2/9 Scores:    10/21/2020    8:27 AM  Depression screen PHQ 2/9  Decreased Interest 0  Down, Depressed, Hopeless 0  PHQ - 2 Score 0    Scribe for Treatment Team: Kentravious Lipford A [place], Latanya Presser 04/06/2022 11:31 AM

## 2022-04-06 NOTE — BHH Group Notes (Signed)
[place] Group Notes:  (Nursing/MHT/Case Management/Adjunct)  Date:  04/06/2022  Time:  10:07 AM  Type of Therapy:     Coloring and making cards.  Participation Level:  Active  Participation Quality:  Appropriate  Affect:  Appropriate  Cognitive:  Appropriate  Insight:  Appropriate  Engagement in Group:  Engaged  Modes of Intervention:  Activity  Summary of Progress/Problems:  Bruce Yoder 04/06/2022, 10:07 AM

## 2022-04-06 NOTE — Progress Notes (Signed)
Patient resting quietly in bed with eyes closed, Respirations equal and unlabored, skin warm and dry, NAD. Routine safety checks conducted according to facility protocol. Will continue to monitor for safety.

## 2022-04-07 DIAGNOSIS — F3163 Bipolar disorder, current episode mixed, severe, without psychotic features: Secondary | ICD-10-CM | POA: Diagnosis not present

## 2022-04-07 MED ORDER — RISPERIDONE 1 MG PO TABS
1.5000 mg | ORAL_TABLET | ORAL | Status: DC
  Administered 2022-04-07 – 2022-04-08 (×2): 1.5 mg via ORAL
  Filled 2022-04-07 (×2): qty 2

## 2022-04-07 MED ORDER — DOXEPIN HCL 50 MG PO CAPS
75.0000 mg | ORAL_CAPSULE | Freq: Every day | ORAL | Status: DC
  Administered 2022-04-07 – 2022-04-14 (×8): 75 mg via ORAL
  Filled 2022-04-07 (×8): qty 1

## 2022-04-07 MED ORDER — VENLAFAXINE HCL ER 75 MG PO CP24
225.0000 mg | ORAL_CAPSULE | Freq: Every day | ORAL | Status: DC
  Administered 2022-04-08 – 2022-04-10 (×3): 225 mg via ORAL
  Filled 2022-04-07 (×3): qty 3

## 2022-04-07 NOTE — Group Note (Signed)
Date:  04/07/2022 Time:  9:46 PM  Group Topic/Focus:  Wrap-Up Group:   The focus of this group is to help patients review their daily goal of treatment and discuss progress on daily workbooks.    Participation Level:  Active  Participation Quality:  Sharing  Affect:  Excited  Cognitive:  Oriented  Insight: Good  Engagement in Group:  Developing/Improving  Modes of Intervention:  Discussion  Additional Comments:  Group Topic was "What are you thankful for?"  Neville Route 04/07/2022, 9:46 PM

## 2022-04-07 NOTE — Group Note (Signed)
[facility] LCSW Group Therapy Note   Group Date: 04/07/2022 Start Time: 1310 End Time: 1400   Type of Therapy/Topic:  Group Therapy:  Emotion Regulation  Participation Level:  Active   Mood:  Description of Group:    The purpose of this group is to assist patients in learning to regulate negative emotions and experience positive emotions. Patients will be guided to discuss ways in which they have been vulnerable to their negative emotions. These vulnerabilities will be juxtaposed with experiences of positive emotions or situations, and patients challenged to use positive emotions to combat negative ones. Special emphasis will be placed on coping with negative emotions in conflict situations, and patients will process healthy conflict resolution skills.  Therapeutic Goals: Patient will identify two positive emotions or experiences to reflect on in order to balance out negative emotions:  Patient will label two or more emotions that they find the most difficult to experience:  Patient will be able to demonstrate positive conflict resolution skills through discussion or role plays:   Summary of Patient Progress:   Patient was present for the entirety of the group session. Patient was an active listener and participated in the topic of discussion, provided helpful advice to others, and added nuance to topic of conversation.  CSW led patient in meditation exercise to reduce anxiety and stress. He said that the activity was helpful however, "all the thoughts come back once it's over." He said that he is very worried but can attempt to make "a small change" by getting his new house organized.     Therapeutic Modalities:   Cognitive Behavioral Therapy Feelings Identification Dialectical Behavioral Therapy   Vonzella Althaus A [place], LCSWA

## 2022-04-07 NOTE — Progress Notes (Signed)
Patient complaining of constipation at morning assessment. PRN Milk of Magnesia administered. This writer will continue to monitor patient for continued constipation. Patient encouraged to drink fluids. Patient agrees to increase his fluid intake.

## 2022-04-07 NOTE — BH Assessment (Signed)
1910 Received patient sitting in the day room visiting with daughter. He is alert and appears to be enjoying the visit. Will continue to monitor patient for safety.  2110 Patient is oriented x 4, he admits to being depressed and rates the depression he is feeling a (6). He also reports anxiety at an (7) and denies having any suicidal thoughts.   Patient denies homicidal thoughts nor is he having A/V hallucinations. He contracts for safety and agrees to seek out staff if he began to feel negative feelings about death. Patient is monitored every 15 minutes for safety.   2200 Patient is medication compliant and received medication education. He voiced that he understood the medication education that he received. Will continue to monitor patient for safety.  2230 Patient requested and received a cup of ice water complained of being thirsty.  0200 Patient has continued to rest quietly in bed. Will continue to monitor patient for safety.  0630 Patient has rested quietly in bed most of the shift except for tolelting. Will continue to monitor patient for safety.

## 2022-04-07 NOTE — Group Note (Addendum)
Recreation Therapy Group Note   Group Topic:Health and Wellness  Group Date: 04/07/2022 Start Time: 1400 End Time: 1435 Facilitators: Vilma Prader, LRT, CTRS Location:  Dayroom  Group Description: Seated Exercise. LRT discussed the mental and physical benefits of exercise. LRT and group discussed how physical activity can be used as a coping skill. Pt's and LRT followed along to an exercise video on the TV screen that provided a visual representation and audio description of every exercise performed. Pt's encouraged to listen to their bodies and stop at any time if they experience feelings of discomfort or pain. LRT passed out water after session was over and encouraged pts do drink and stay hydrated.  Goal Area(s) Addressed: Ability to follow one-step directions Ability to follow multi-step directions Identification of a coping skill  Identification of a leisure interest  Affect/Mood: Appropriate and Happy   Participation Level: Active and Engaged   Participation Quality: Independent   Behavior: Alert and Appropriate   Speech/Thought Process: Coherent and Directed   Insight: Good   Judgement: Good   Modes of Intervention: Activity and Education   Patient Response to Interventions:  Engaged   Education Outcome:  Acknowledges education   Clinical Observations/Individualized Feedback: Ricard was active in their participation of session activities and group discussion. Pt identified "exercise releases endorphins in the brain". Pt followed along with video and performed all exercises. No pain noted.   Plan: Continue to engage patient in RT group sessions 2-3x/week.   Vilma Prader, LRT, [place]  04/07/2022 2:59 PM

## 2022-04-07 NOTE — Plan of Care (Signed)
  Problem: Education: Goal: Knowledge of General Education information will improve Description: Including pain rating scale, medication(s)/side effects and non-pharmacologic comfort measures Outcome: Progressing   Problem: Clinical Measurements: Goal: Ability to maintain clinical measurements within normal limits will improve Outcome: Progressing   Problem: Clinical Measurements: Goal: Will remain free from infection Outcome: Progressing   Problem: Clinical Measurements: Goal: Will remain free from infection Outcome: Progressing   Problem: Coping: Goal: Level of anxiety will decrease Outcome: Progressing  Patient endorsing anxiety 4/10 and depression 3/10 denies SI/HI/A/VH and verbally contracted for safety. Support and encouragement provided. Compliant with medications no adverse effect reported.

## 2022-04-07 NOTE — BHH Group Notes (Signed)
[place] Group Notes:  (Nursing/MHT/Case Management/Adjunct)  Date:  04/07/2022  Time:  2:55 PM  Type of Therapy:  Music Therapy  Participation Level:  Active  Participation Quality:  Appropriate  Affect:  Appropriate  Cognitive:  Appropriate  Insight:  Appropriate  Engagement in Group:  Engaged  Modes of Intervention:  Activity  Summary of Progress/Problems:  Antonieta Pert 04/07/2022, 2:55 PM

## 2022-04-07 NOTE — Progress Notes (Signed)
[facility] MD Progress Note  04/07/2022 2:01 PM MACIN CZARNECKI  MRN:  [id number] Subjective: Bruce Yoder is seen on rounds.  I discontinued his Remeron yesterday and started him on 50 mg of doxepin.  He states that he slept better.  He is still having a lot of racing thoughts when I ask him if the Risperdal makes him tired he said no.  I told her we can go up on it.  Been compliant with medications and no side effects.  Total more also good to go up on the Effexor so norepinephrine has a chance to kick in and help with his anxiety.  He denies any suicidal ideation.  Principal Problem: Bipolar 1 disorder, mixed, severe ([place]) Diagnosis: Principal Problem:   Bipolar 1 disorder, mixed, severe (HCC) Active Problems:   Cluster B personality disorder in adult [facility])  Total Time spent with patient: 15 minutes  Past Psychiatric History:  He has 1 previous psychiatric admission at [facility] for suicidal ideation 20 years ago.  He was recently discharged from [facility] and has been following up in outpatient with Dr. Reece Levy for the past couple weeks.  Recently took an overdose of Seroquel that was prescribed by his PCP.     Past Medical History:  Past Medical History:  Diagnosis Date   Atrial fibrillation ([place])    a. diagnosed on 10/22/2017; b. successful DCCV on 12/14/2017; Paroxysmal Persistent   Basal cell carcinoma (BCC) of back    Dr. Elvera Lennox   CAD (coronary artery disease)    a. LHC 01/31/2018: OM2 55-60%, FFR 0.93. pLAD 25%   CKD (chronic kidney disease) stage 3, GFR 30-59 ml/min (HCC)    Unilateral kidney   Dilated cardiomyopathy -related to A. fib RVR; resolved 10/2017   Thought to be related to A. fib RVR: a) echo 11/03/2017 showed LVEF of 35-40% with severe LVH --> follow-up echo January 2020 showed resolution with EF 55 to 60%.  Normal filling pressures.  Read as mild as opposed to severe LVH   Diverticulosis of colon    With polyps (Dr. Amedeo Plenty March 2017, followed by Dr. Glennon Hamilton)    Erectile dysfunction    Essential hypertension 2018   Gout    OSA on CPAP    [facility]   Status post nephrectomy 1965   Thrombocytopenia, unspecified ([place])    Platelet clumping (pseudothrombocytopenia)    Past Surgical History:  Procedure Laterality Date   APPENDECTOMY     During childhood   ATRIAL FIBRILLATION ABLATION N/A 10/11/2020   Procedure: ATRIAL FIBRILLATION ABLATION;  Surgeon: Constance Haw, MD;  Location: [place] [facility];  Service: Cardiovascular;  Laterality: N/A;   CARDIOVERSION N/A 12/14/2017   Procedure: CARDIOVERSION;  Surgeon: Sueanne Margarita, MD;  Location: [facility] ENDOSCOPY;  Service: Cardiovascular;  Laterality: N/A;   CARDIOVERSION N/A 07/04/2020   Procedure: CARDIOVERSION;  Surgeon: Sueanne Margarita, MD;  Location: [place];  Service: Cardiovascular;  Laterality: N/A;   CYST EXCISION     face   INTRAVASCULAR PRESSURE WIRE/FFR STUDY N/A 01/31/2018   Procedure: INTRAVASCULAR PRESSURE WIRE/FFR STUDY;  Surgeon: Leonie Man, MD;  Location: [place] [facility];  Service: Cardiovascular;  Laterality: N/A;   LEFT HEART CATH AND CORONARY ANGIOGRAPHY N/A 01/31/2018   Procedure: LEFT HEART CATH AND CORONARY ANGIOGRAPHY;  Surgeon: Leonie Man, MD;  Location: [place] [facility];  Service: Cardiovascular: Single-vessel disease with roughly 60% lesion in major OM branch.  FFR negative.  NEPHRECTOMY Right 1965   TRANSTHORACIC ECHOCARDIOGRAM  10/2017    EF 35 -40% with severe LVH.  Diffuse global hypokinesis.  Mild aortic and mitral regurgitation.  Mild left and right atrial dilation.   TRANSTHORACIC ECHOCARDIOGRAM  02/2018   EF 55-60%, normal filling pressure. Mod LA dilation.  Mild to moderate TR. -> resolution of cardiomyopathy   VASECTOMY  1989   Family History:  Family History  Problem Relation Age of Onset   Colon cancer Mother    Hypertension Father    Seizures Father    Stroke Father    Dementia Paternal Grandmother     Heart attack Paternal Grandfather    Family Psychiatric  History: Unremarkable Social History:  Social History   Substance and Sexual Activity  Alcohol Use Not Currently   Alcohol/week: 2.0 standard drinks of alcohol   Types: 1 Glasses of wine, 1 Cans of beer per week     Social History   Substance and Sexual Activity  Drug Use Never    Social History   Socioeconomic History   Marital status: Married    Spouse name: Not on file   Number of children: Not on file   Years of education: Not on file   Highest education level: Not on file  Occupational History   Not on file  Tobacco Use   Smoking status: Never   Smokeless tobacco: Never  Vaping Use   Vaping Use: Never used  Substance and Sexual Activity   Alcohol use: Not Currently    Alcohol/week: 2.0 standard drinks of alcohol    Types: 1 Glasses of wine, 1 Cans of beer per week   Drug use: Never   Sexual activity: Not on file  Other Topics Concern   Not on file  Social History Narrative   He is married now for 4 years (remarried).  He has 1 child (presumably from previous marriage -75 years old).   He lives with his wife.  He is an avid Chief Executive Officer.   He is a retired Producer, television/film/video for [employer].  (He has a BA in American studies at [employer].)   Never smoked.  Drinks 5-7 alcoholic beverages a week usually beer or hard cider.  May be occasionally a mixed drink.   He is quite active exercise at least 4 days a week for 30 minutes at a time.       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  He enjoys walking on both treadmill and in the community.  He plays golf routinely.   Social Determinants of Health   Financial Resource Strain: Not on file  Food Insecurity: No Food Insecurity (04/04/2022)   Hunger Vital Sign    Worried About Running Out of Food in the Last Year: Never true    Ran Out of Food in the Last Year:  Never true  Transportation Needs: No Transportation Needs (04/04/2022)   PRAPARE - Hydrologist (Medical): No    Lack of Transportation (Non-Medical): No  Physical Activity: Not on file  Stress: Not on file  Social Connections: Not on file   Additional Social History:                         Sleep: Good  Appetite:  Good  Current Medications: Current Facility-Administered Medications  Medication Dose Route Frequency Provider Last Rate Last  Admin   acetaminophen (TYLENOL) tablet 1,000 mg  1,000 mg Oral Q6H PRN Deloria Lair, NP       allopurinol (ZYLOPRIM) tablet 150 mg  150 mg Oral Daily Dixon, Rashaun M, NP   150 mg at 04/07/22 1006   alum & mag hydroxide-simeth (MAALOX/MYLANTA) 200-200-20 MG/5ML suspension 30 mL  30 mL Oral Q4H PRN Deloria Lair, NP       atorvastatin (LIPITOR) tablet 40 mg  40 mg Oral Daily Dixon, Rashaun M, NP   40 mg at 04/07/22 0800   doxepin (SINEQUAN) capsule 75 mg  75 mg Oral QHS Parks Ranger, DO       hydrochlorothiazide (HYDRODIURIL) tablet 25 mg  25 mg Oral Daily Parks Ranger, DO   25 mg at 04/07/22 0759   LORazepam (ATIVAN) tablet 0.5 mg  0.5 mg Oral Q4H PRN Parks Ranger, DO   0.5 mg at 04/06/22 0933   losartan (COZAAR) tablet 100 mg  100 mg Oral Daily Parks Ranger, DO   100 mg at 04/07/22 [id number]   magnesium hydroxide (MILK OF MAGNESIA) suspension 30 mL  30 mL Oral Daily PRN Deloria Lair, NP   30 mL at 04/07/22 0828   metoprolol succinate (TOPROL-XL) 24 hr tablet 50 mg  50 mg Oral Daily Doren Custard, Rashaun M, NP   50 mg at 04/07/22 0800   multivitamin (RENA-VIT) tablet 1 tablet  1 tablet Oral QHS Deloria Lair, NP   1 tablet at 04/06/22 2124   risperiDONE (RISPERDAL) tablet 1.5 mg  1.5 mg Oral [id number] Parks Ranger, DO       rivaroxaban Alveda Reasons) tablet 20 mg  20 mg Oral Q supper Doren Custard, Rashaun M, NP   20 mg at 04/06/22 1701   traZODone (DESYREL) tablet 50 mg   50 mg Oral QHS PRN Deloria Lair, NP       [START ON 04/08/2022] venlafaxine XR (EFFEXOR-XR) 24 hr capsule 225 mg  225 mg Oral QPC breakfast Parks Ranger, DO        Lab Results: No results found for this or any previous visit (from the past 48 hour(s)).  Blood Alcohol level:  Lab Results  Component Value Date   ETH <10 [id number]    Metabolic Disorder Labs: No results found for: "HGBA1C", "MPG" No results found for: "PROLACTIN" Lab Results  Component Value Date   CHOL 142 02/01/2018   TRIG 105 02/01/2018   HDL 34 (L) 02/01/2018   CHOLHDL 4.2 02/01/2018   VLDL 21 02/01/2018   LDLCALC 87 02/01/2018    Physical Findings: AIMS:  , ,  ,  ,    CIWA:    COWS:     Musculoskeletal: Strength & Muscle Tone: within normal limits Gait & Station: normal Patient leans: N/A  Psychiatric Specialty Exam:  Presentation  General Appearance:  Appropriate for Environment  Eye Contact: Fair  Speech: Slurred  Speech Volume: Normal  Handedness: Right   Mood and Affect  Mood: Hopeless  Affect: Depressed; Flat   Thought Process  Thought Processes: Coherent  Descriptions of Associations:Intact  Orientation:Full (Time, Place and Person)  Thought Content:WDL  History of Schizophrenia/Schizoaffective disorder:No data recorded Duration of Psychotic Symptoms:No data recorded Hallucinations:No data recorded Ideas of Reference:None  Suicidal Thoughts:No data recorded Homicidal Thoughts:No data recorded  Sensorium  Memory: Immediate Good; Remote Good; Recent Good  Judgment: Fair  Insight: Good   Executive Functions  Concentration: Good  Attention Span: Good  Recall: Good  Fund of Knowledge: Good  Language: Good   Psychomotor Activity  Psychomotor Activity:No data recorded  Assets  Assets: Communication Skills; Financial Resources/Insurance; Housing   Sleep  Sleep:No data recorded   Physical Exam: Physical Exam Vitals  and nursing note reviewed.  Constitutional:      Appearance: Normal appearance. He is normal weight.  Neurological:     General: No focal deficit present.     Mental Status: He is alert and oriented to person, place, and time.  Psychiatric:        Attention and Perception: Attention and perception normal.        Mood and Affect: Mood is depressed. Affect is flat.        Speech: Speech normal.        Behavior: Behavior normal. Behavior is cooperative.        Thought Content: Thought content normal.        Cognition and Memory: Cognition and memory normal.        Judgment: Judgment normal.    Review of Systems  Constitutional: Negative.   HENT: Negative.    Eyes: Negative.   Respiratory: Negative.    Cardiovascular: Negative.   Gastrointestinal: Negative.   Genitourinary: Negative.   Musculoskeletal: Negative.   Skin: Negative.   Neurological: Negative.   Endo/Heme/Allergies: Negative.   Psychiatric/Behavioral:  Positive for depression. The patient is nervous/anxious and has insomnia.    Blood pressure 122/86, pulse 72, temperature 97.7 F (36.5 C), temperature source Oral, resp. rate 16, height 5' 10"$  (1.778 m), weight 104.3 kg, SpO2 98 %. Body mass index is 33 kg/m.   Treatment Plan Summary: Daily contact with patient to assess and evaluate symptoms and progress in treatment, Medication management, and Plan increase Effexor XR to 225.  Increase doxepin to 75 at bedtime.  Increase Risperdal to 1.5 mg twice a day.  Parks Ranger, DO 04/07/2022, 2:01 PM

## 2022-04-07 NOTE — Plan of Care (Signed)
D- Patient alert and oriented x 4. Patient presenting with a pleasant mood and affect. Patient denies SI, HI, AVH, and pain. However, patient does endorse anxiety and depression of 7/10 for both. Patient complaining of constipation. Patient encouraged to increase his fluid intake and drink more water.   A- Scheduled medications administered to patient, per MD orders. Support and encouragement provided.  Routine safety checks conducted every 15 minutes.  Patient informed to notify staff with problems or concerns.  R- No adverse drug reactions noted. Patient contracts for safety at this time. Patient compliant with medications and treatment plan. Patient receptive, calm, and cooperative. Patient interacts well with others on the unit. Patient participated in two groups. Patient remains safe at this time.   Problem: Education: Goal: Knowledge of General Education information will improve Description: Including pain rating scale, medication(s)/side effects and non-pharmacologic comfort measures Outcome: Progressing   Problem: Health Behavior/Discharge Planning: Goal: Ability to manage health-related needs will improve Outcome: Progressing   Problem: Clinical Measurements: Goal: Ability to maintain clinical measurements within normal limits will improve Outcome: Progressing Goal: Will remain free from infection Outcome: Progressing Goal: Cardiovascular complication will be avoided Outcome: Progressing   Problem: Nutrition: Goal: Adequate nutrition will be maintained Outcome: Progressing   Problem: Elimination: Goal: Will not experience complications related to bowel motility Outcome: Not Progressing Goal: Will not experience complications related to urinary retention Outcome: Progressing   Problem: Pain Managment: Goal: General experience of comfort will improve Outcome: Progressing

## 2022-04-08 DIAGNOSIS — F3163 Bipolar disorder, current episode mixed, severe, without psychotic features: Secondary | ICD-10-CM | POA: Diagnosis not present

## 2022-04-08 LAB — LIPID PANEL
Cholesterol: 90 mg/dL (ref 0–200)
HDL: 35 mg/dL — ABNORMAL LOW (ref >40)
LDL Cholesterol: 38 mg/dL (ref 0–99)
Total CHOL/HDL Ratio: 2.6 RATIO
Triglycerides: 83 mg/dL (ref <150)
VLDL: 17 mg/dL (ref 0–40)

## 2022-04-08 LAB — HEMOGLOBIN A1C
Hgb A1c MFr Bld: 4.8 % (ref 4.8–5.6)
Mean Plasma Glucose: 91.06 mg/dL

## 2022-04-08 MED ORDER — RISPERIDONE 1 MG PO TABS
1.0000 mg | ORAL_TABLET | ORAL | Status: DC
  Administered 2022-04-08 – 2022-04-15 (×14): 1 mg via ORAL
  Filled 2022-04-08 (×14): qty 1

## 2022-04-08 MED ORDER — RISPERIDONE 1 MG PO TABS: 2.0000 mg | ORAL_TABLET | ORAL | Status: DC

## 2022-04-08 NOTE — Group Note (Addendum)
Recreation Therapy Group Note   Group Topic:Self-Esteem  Group Date: 04/08/2022 Start Time: 1400 End Time: 1450 Facilitators: Vilma Prader, LRT, CTRS Location:  Dayroom  Group Description: Patients and LRT discussed the importance of self-love and self-esteem. Pt completed a worksheet that helps them identify 24 different strengths and qualities about themselves. Pt encouraged to read aloud at least 3 off their sheet to the group. Pt's then had the choice to play "Positive Affirmation Bingo" afterwards, with journals or stress balls as bingo prizes.   Affect/Mood: Appropriate and Euthymic   Participation Level: Active and Engaged   Participation Quality: Independent   Behavior: Alert, Appropriate, and Calm   Speech/Thought Process: Coherent and Directed   Insight: Good   Judgement: Good   Modes of Intervention: Activity and Worksheet   Patient Response to Interventions:  Attentive, Engaged, and Interested    Education Outcome:  Acknowledges education   Clinical Observations/Individualized Feedback: Bruce Yoder was active in their participation of session activities and group discussion. Pt identified "my smile, I have an athletic built, and my hair" as things that he likes most about himself. He also shared that he is good at "golf, playing music, and active listening to others". He shared that something challenging he got over in the past was "I had my kidney removed in highschool and I was able to bounce back and still be an athlete." Pt was calm and pleasant throughout group. He gave good insight during the group discussion. Pt won a round of bingo and chose a journal as his prize. RN and NT aware.   Plan: Continue to engage patient in RT group sessions 2-3x/week.   Vilma Prader, LRT, CTRS 04/08/2022 3:03 PM

## 2022-04-08 NOTE — Group Note (Signed)
[facility] LCSW Group Therapy Note   Group Date: 04/08/2022 Start Time: 61 End Time: 1115   Type of Therapy/Topic:  Group Therapy:  Balance in Life  Participation Level:  Active   Description of Group:    This group will address the concept of balance and how it feels and looks when one is unbalanced. Patients will be encouraged to process areas in their lives that are out of balance, and identify reasons for remaining unbalanced. Facilitators will guide patients utilizing problem- solving interventions to address and correct the stressor making their life unbalanced. Understanding and applying boundaries will be explored and addressed for obtaining  and maintaining a balanced life. Patients will be encouraged to explore ways to assertively make their unbalanced needs known to significant others in their lives, using other group members and facilitator for support and feedback.  Therapeutic Goals: Patient will identify two or more emotions or situations they have that consume much of in their lives. Patient will identify signs/triggers that life has become out of balance:  Patient will identify two ways to set boundaries in order to achieve balance in their lives:  Patient will demonstrate ability to communicate their needs through discussion and/or role plays  Summary of Patient Progress:    Patient was present for the entirety of the group session. Patient was an active listener and participated in the topic of discussion, provided helpful advice to others, and added nuance to topic of conversation.  He stated that he was very anxious about completing his taxes and divorce paperwork etc. He said he was not ready to leave and felt worried. He completed Valentine's Day activities to assist in developing balance.     Therapeutic Modalities:   Cognitive Behavioral Therapy Solution-Focused Therapy Assertiveness Training   Dougherty A Martinique, LCSWA

## 2022-04-08 NOTE — Group Note (Signed)
Date:  04/08/2022 Time:  1:38 PM  Group Topic/Focus:  Building Self Esteem:   The Focus of this group is helping patients become aware of the effects of self-esteem on their lives, the things they and others do that enhance or undermine their self-esteem, seeing the relationship between their level of self-esteem and the choices they make and learning ways to enhance self-esteem. Goals Group:   The focus of this group is to help patients establish daily goals to achieve during treatment and discuss how the patient can incorporate goal setting into their daily lives to aide in recovery.    Participation Level:  Active  Participation Quality:  Appropriate  Affect:  Appropriate  Cognitive:  Alert and Appropriate  Insight: Appropriate and Good  Engagement in Group:  Engaged  Modes of Intervention:  Activity and Discussion  Additional Comments:    Ladona Mow 04/08/2022, 1:38 PM

## 2022-04-08 NOTE — Plan of Care (Signed)
Pt endorses anxiety/depression at this time. Prn medication offered and refused by pt. Pt denies SI/HI/AVH or pain at this time. Pt is calm and cooperative. Pt is medication compliant. Pt provided with support and encouragement. Pt monitored q15 minutes for safety per unit policy. Plan of care ongoing.   Problem: Education: Goal: Knowledge of General Education information will improve Description: Including pain rating scale, medication(s)/side effects and non-pharmacologic comfort measures Outcome: Progressing   Problem: Nutrition: Goal: Adequate nutrition will be maintained Outcome: Progressing

## 2022-04-08 NOTE — BH Assessment (Signed)
1930 Received patient sitting on his bed in his room. He is alert and oriented x4, currently denying SI/HI, and A/V hallucinations. However, he is endorsing depression of (5) and anxiety (5). This may increase due to patient being quarantined in his room due to his COVID exposure. He has been tested; results are pending. Will continue to monitor patient for safety and COVID symptoms.  2230 Patient has been noted reading on 15 minute safety checks. His mood thus far has not decline past baseline notation this shift. He is medication compliant and cooperative with staying in his room. He denied that he he needed a snack, but requested a cup of water. This Probation officer has noted that patient is requesting a lot of water to drink and he is being tested for diabetes. Will continue to monitor patient for safety, signs and symptoms of diabetes and COVID.  0230 Patient has rested quietly in bed thus far. He remains on isolation and he has maintained the protocol.  0530 Patient has been up to the bathroom several since 0230 no unsafe behavior noted.  0630 Patient lying in bed resting with eyes open.

## 2022-04-08 NOTE — Progress Notes (Signed)
[facility] MD Progress Note  04/08/2022 12:43 PM Bruce Yoder  MRN:  [id number] Subjective: Bruce Yoder is seen on rounds.  I increased his doxepin last night and he says that he slept better.  Nurses report that he is very anxious this morning.  Actually, he looks calmer than yesterday.  He is compliant with medications and denies any side effects.  He says that he is not too sleepy from the Risperdal which I told him we will go up on it because he is still complaining of racing thoughts. Principal Problem: Bipolar 1 disorder, mixed, severe ([place]) Diagnosis: Principal Problem:   Bipolar 1 disorder, mixed, severe (HCC) Active Problems:   Cluster B personality disorder in adult [facility])  Total Time spent with patient: 15 minutes  Past Psychiatric History:  He has 1 previous psychiatric admission at [facility] for suicidal ideation 20 years ago.  He was recently discharged from [facility] and has been following up in outpatient with Dr. Reece Levy for the past couple weeks.  Recently took an overdose of Seroquel that was prescribed by his PCP.      Past Medical History:  Past Medical History:  Diagnosis Date   Atrial fibrillation ([place])    a. diagnosed on 10/22/2017; b. successful DCCV on 12/14/2017; Paroxysmal Persistent   Basal cell carcinoma (BCC) of back    Dr. Elvera Lennox   CAD (coronary artery disease)    a. LHC 01/31/2018: OM2 55-60%, FFR 0.93. pLAD 25%   CKD (chronic kidney disease) stage 3, GFR 30-59 ml/min (HCC)    Unilateral kidney   Dilated cardiomyopathy -related to A. fib RVR; resolved 10/2017   Thought to be related to A. fib RVR: a) echo 11/03/2017 showed LVEF of 35-40% with severe LVH --> follow-up echo January 2020 showed resolution with EF 55 to 60%.  Normal filling pressures.  Read as mild as opposed to severe LVH   Diverticulosis of colon    With polyps (Dr. Amedeo Plenty March 2017, followed by Dr. Glennon Hamilton)   Erectile dysfunction    Essential hypertension 2018   Gout    OSA on CPAP     [facility]   Status post nephrectomy 1965   Thrombocytopenia, unspecified ([place])    Platelet clumping (pseudothrombocytopenia)    Past Surgical History:  Procedure Laterality Date   APPENDECTOMY     During childhood   ATRIAL FIBRILLATION ABLATION N/A 10/11/2020   Procedure: ATRIAL FIBRILLATION ABLATION;  Surgeon: Constance Haw, MD;  Location: [place] [facility];  Service: Cardiovascular;  Laterality: N/A;   CARDIOVERSION N/A 12/14/2017   Procedure: CARDIOVERSION;  Surgeon: Sueanne Margarita, MD;  Location: [facility] ENDOSCOPY;  Service: Cardiovascular;  Laterality: N/A;   CARDIOVERSION N/A 07/04/2020   Procedure: CARDIOVERSION;  Surgeon: Sueanne Margarita, MD;  Location: [place];  Service: Cardiovascular;  Laterality: N/A;   CYST EXCISION     face   INTRAVASCULAR PRESSURE WIRE/FFR STUDY N/A 01/31/2018   Procedure: INTRAVASCULAR PRESSURE WIRE/FFR STUDY;  Surgeon: Leonie Man, MD;  Location: [place] [facility];  Service: Cardiovascular;  Laterality: N/A;   LEFT HEART CATH AND CORONARY ANGIOGRAPHY N/A 01/31/2018   Procedure: LEFT HEART CATH AND CORONARY ANGIOGRAPHY;  Surgeon: Leonie Man, MD;  Location: [place] [facility];  Service: Cardiovascular: Single-vessel disease with roughly 60% lesion in major OM branch.  FFR negative.   NEPHRECTOMY Right 1965   TRANSTHORACIC ECHOCARDIOGRAM  10/2017    EF 35 -40% with severe LVH.  Diffuse global  hypokinesis.  Mild aortic and mitral regurgitation.  Mild left and right atrial dilation.   TRANSTHORACIC ECHOCARDIOGRAM  02/2018   EF 55-60%, normal filling pressure. Mod LA dilation.  Mild to moderate TR. -> resolution of cardiomyopathy   VASECTOMY  1989   Family History:  Family History  Problem Relation Age of Onset   Colon cancer Mother    Hypertension Father    Seizures Father    Stroke Father    Dementia Paternal Grandmother    Heart attack Paternal Grandfather    Family Psychiatric  History:  Unremarkable Social History:  Social History   Substance and Sexual Activity  Alcohol Use Not Currently   Alcohol/week: 2.0 standard drinks of alcohol   Types: 1 Glasses of wine, 1 Cans of beer per week     Social History   Substance and Sexual Activity  Drug Use Never    Social History   Socioeconomic History   Marital status: Married    Spouse name: Not on file   Number of children: Not on file   Years of education: Not on file   Highest education level: Not on file  Occupational History   Not on file  Tobacco Use   Smoking status: Never   Smokeless tobacco: Never  Vaping Use   Vaping Use: Never used  Substance and Sexual Activity   Alcohol use: Not Currently    Alcohol/week: 2.0 standard drinks of alcohol    Types: 1 Glasses of wine, 1 Cans of beer per week   Drug use: Never   Sexual activity: Not on file  Other Topics Concern   Not on file  Social History Narrative   He is married now for 4 years (remarried).  He has 1 child (presumably from previous marriage -75 years old).   He lives with his wife.  He is an avid Chief Executive Officer.   He is a retired Producer, television/film/video for [employer].  (He has a BA in American studies at [employer].)   Never smoked.  Drinks 5-7 alcoholic beverages a week usually beer or hard cider.  May be occasionally a mixed drink.   He is quite active exercise at least 4 days a week for 30 minutes at a time.       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  He enjoys walking on both treadmill and in the community.  He plays golf routinely.   Social Determinants of Health   Financial Resource Strain: Not on file  Food Insecurity: No Food Insecurity (04/04/2022)   Hunger Vital Sign    Worried About Running Out of Food in the Last Year: Never true    Ran Out of Food in the Last Year: Never true  Transportation Needs: No Transportation Needs  (04/04/2022)   PRAPARE - Hydrologist (Medical): No    Lack of Transportation (Non-Medical): No  Physical Activity: Not on file  Stress: Not on file  Social Connections: Not on file   Additional Social History:                         Sleep: Good  Appetite:  Good  Current Medications: Current Facility-Administered Medications  Medication Dose Route Frequency Provider Last Rate Last Admin   acetaminophen (TYLENOL) tablet 1,000 mg  1,000 mg Oral Q6H PRN Deloria Lair, NP  allopurinol (ZYLOPRIM) tablet 150 mg  150 mg Oral Daily Dixon, Rashaun M, NP   150 mg at 04/08/22 0936   alum & mag hydroxide-simeth (MAALOX/MYLANTA) 200-200-20 MG/5ML suspension 30 mL  30 mL Oral Q4H PRN Deloria Lair, NP       atorvastatin (LIPITOR) tablet 40 mg  40 mg Oral Daily Dixon, Rashaun M, NP   40 mg at 04/08/22 0936   doxepin (SINEQUAN) capsule 75 mg  75 mg Oral QHS Parks Ranger, DO   75 mg at 04/07/22 2145   hydrochlorothiazide (HYDRODIURIL) tablet 25 mg  25 mg Oral Daily Parks Ranger, DO   25 mg at 04/08/22 [id number]   LORazepam (ATIVAN) tablet 0.5 mg  0.5 mg Oral Q4H PRN Parks Ranger, DO   0.5 mg at 04/06/22 0933   losartan (COZAAR) tablet 100 mg  100 mg Oral Daily Parks Ranger, DO   100 mg at 04/08/22 0936   magnesium hydroxide (MILK OF MAGNESIA) suspension 30 mL  30 mL Oral Daily PRN Deloria Lair, NP   30 mL at 04/07/22 0828   metoprolol succinate (TOPROL-XL) 24 hr tablet 50 mg  50 mg Oral Daily Anette Riedel M, NP   50 mg at 04/08/22 [id number]   multivitamin (RENA-VIT) tablet 1 tablet  1 tablet Oral QHS Deloria Lair, NP   1 tablet at 04/07/22 2145   risperiDONE (RISPERDAL) tablet 1.5 mg  1.5 mg Oral [id number] Parks Ranger, DO   1.5 mg at 04/08/22 [id number]   rivaroxaban (XARELTO) tablet 20 mg  20 mg Oral Q supper Anette Riedel M, NP   20 mg at 04/07/22 1723   traZODone (DESYREL) tablet 50 mg  50 mg Oral QHS  PRN Deloria Lair, NP       venlafaxine XR (EFFEXOR-XR) 24 hr capsule 225 mg  225 mg Oral QPC breakfast Parks Ranger, DO   225 mg at 04/08/22 [id number]    Lab Results:  Results for orders placed or performed during the hospital encounter of 04/04/22 (from the past 48 hour(s))  Hemoglobin A1c     Status: None   Collection Time: 04/08/22  6:12 AM  Result Value Ref Range   Hgb A1c MFr Bld 4.8 4.8 - 5.6 %    Comment: (NOTE) Pre diabetes:          5.7%-6.4%  Diabetes:              >6.4%  Glycemic control for   <7.0% adults with diabetes    Mean Plasma Glucose 91.06 mg/dL    Comment: Performed at [facility], [place] [address], [place], [place] [address]  Lipid panel     Status: Abnormal   Collection Time: 04/08/22  6:12 AM  Result Value Ref Range   Cholesterol 90 0 - 200 mg/dL   Triglycerides 83 <150 mg/dL   HDL 35 (L) >40 mg/dL   Total CHOL/HDL Ratio 2.6 RATIO   VLDL 17 0 - 40 mg/dL   LDL Cholesterol 38 0 - 99 mg/dL    Comment:        Total Cholesterol/HDL:CHD Risk Coronary Heart Disease Risk Table                     Men   Women  1/2 Average Risk   3.4   3.3  Average Risk       5.0   4.4  2 X Average Risk   9.6  7.1  3 X Average Risk  23.4   11.0        Use the calculated Patient Ratio above and the CHD Risk Table to determine the patient's CHD Risk.        ATP III CLASSIFICATION (LDL):  <100     mg/dL   Optimal  100-129  mg/dL   Near or Above                    Optimal  130-159  mg/dL   Borderline  160-189  mg/dL   High  >190     mg/dL   Very High Performed at [facility], [address] [place], [place] [address]     Blood Alcohol level:  Lab Results  Component Value Date   [facility] <10 [id number]    Metabolic Disorder Labs: Lab Results  Component Value Date   HGBA1C 4.8 04/08/2022   MPG 91.06 04/08/2022   No results found for: "PROLACTIN" Lab Results  Component Value Date   CHOL 90 04/08/2022   TRIG 83 04/08/2022   HDL 35 (L)  04/08/2022   CHOLHDL 2.6 04/08/2022   VLDL 17 04/08/2022   LDLCALC 38 04/08/2022   LDLCALC 87 02/01/2018    Physical Findings: AIMS:  , ,  ,  ,    CIWA:    COWS:     Musculoskeletal: Strength & Muscle Tone: within normal limits Gait & Station: normal Patient leans: N/A  Psychiatric Specialty Exam:  Presentation  General Appearance:  Appropriate for Environment  Eye Contact: Fair  Speech: Slurred  Speech Volume: Normal  Handedness: Right   Mood and Affect  Mood: Hopeless  Affect: Depressed; Flat   Thought Process  Thought Processes: Coherent  Descriptions of Associations:Intact  Orientation:Full (Time, Place and Person)  Thought Content:WDL  History of Schizophrenia/Schizoaffective disorder:No data recorded Duration of Psychotic Symptoms:No data recorded Hallucinations:No data recorded Ideas of Reference:None  Suicidal Thoughts:No data recorded Homicidal Thoughts:No data recorded  Sensorium  Memory: Immediate Good; Remote Good; Recent Good  Judgment: Fair  Insight: Good   Executive Functions  Concentration: Good  Attention Span: Good  Recall: Good  Fund of Knowledge: Good  Language: Good   Psychomotor Activity  Psychomotor Activity:No data recorded  Assets  Assets: Communication Skills; Financial Resources/Insurance; Housing   Sleep  Sleep:No data recorded   Physical Exam: Physical Exam Vitals and nursing note reviewed.  Constitutional:      Appearance: Normal appearance. He is normal weight.  Neurological:     General: No focal deficit present.     Mental Status: He is alert and oriented to person, place, and time.  Psychiatric:        Attention and Perception: Attention and perception normal.        Mood and Affect: Mood is depressed. Affect is flat.        Speech: Speech normal.        Behavior: Behavior normal. Behavior is cooperative.        Thought Content: Thought content normal.        Cognition  and Memory: Cognition and memory normal.        Judgment: Judgment normal.    Review of Systems  Constitutional: Negative.   HENT: Negative.    Eyes: Negative.   Respiratory: Negative.    Cardiovascular: Negative.   Gastrointestinal: Negative.   Genitourinary: Negative.   Musculoskeletal: Negative.   Skin: Negative.   Neurological: Negative.   Endo/Heme/Allergies: Negative.  Psychiatric/Behavioral:  Positive for depression.    Blood pressure (!) 141/84, pulse 79, temperature 98.3 F (36.8 C), resp. rate 18, height 5' 10"$  (1.778 m), weight 104.3 kg, SpO2 97 %. Body mass index is 33 kg/m.   Treatment Plan Summary: Daily contact with patient to assess and evaluate symptoms and progress in treatment, Medication management, and Plan increase Risperdal to 2 mg twice a day.  Parks Ranger, DO 04/08/2022, 12:43 PM

## 2022-04-09 DIAGNOSIS — F3163 Bipolar disorder, current episode mixed, severe, without psychotic features: Secondary | ICD-10-CM | POA: Diagnosis not present

## 2022-04-09 LAB — SARS CORONAVIRUS 2 (TAT 6-24 HRS): SARS Coronavirus 2: NEGATIVE

## 2022-04-09 MED ORDER — LORAZEPAM 0.5 MG PO TABS
0.2500 mg | ORAL_TABLET | ORAL | Status: DC
  Administered 2022-04-09 – 2022-04-10 (×3): 0.25 mg via ORAL
  Filled 2022-04-09 (×3): qty 1

## 2022-04-09 NOTE — Group Note (Signed)
LCSW Group Therapy Note  Group Date: 04/09/2022 Start Time: 1250 End Time: 1335   Type of Therapy and Topic:  Group Therapy - Healthy vs Unhealthy Coping Skills  Participation Level:  Active   Description of Group The focus of this group was to determine what unhealthy coping techniques typically are used by group members and what healthy coping techniques would be helpful in coping with various problems. Patients were guided in becoming aware of the differences between healthy and unhealthy coping techniques. Patients were asked to identify 2-3 healthy coping skills they would like to learn to use more effectively.  Therapeutic Goals Patients learned that coping is what human beings do all day long to deal with various situations in their lives Patients defined and discussed healthy vs unhealthy coping techniques Patients identified their preferred coping techniques and identified whether these were healthy or unhealthy Patients determined 2-3 healthy coping skills they would like to become more familiar with and use more often. Patients provided support and ideas to each other   Summary of Patient Progress:  During group, he expressed that his main coping skill was practicing Hindu meditation practices. He said that it helps him stay emotionally stable but it is not as helpful when he is in an active state of distress. Patient proved open to input from peers and feedback from [place]. Patient demonstrated fair insight into the subject matter, was respectful of peers, and participated throughout the entire session.   Therapeutic Modalities Cognitive Behavioral Therapy Motivational Interviewing  Fawna Cranmer A [place], LCSWA 04/09/2022  1:41 PM

## 2022-04-09 NOTE — Group Note (Signed)
Recreation Therapy Group Note   Group Topic:Relaxation  Group Date: 04/09/2022 Start Time: 1400 End Time: 1445 Facilitators: Leona Carry, CTRS Location:  Dayroom  Group Description: Chair Yoga. LRT and patients discussed the benefits of yoga and how it differs from strength exercises. 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 LRT and patients followed along to a guided yoga session on the television that focused on all parts of the body, as well as deep breathing. Pt encouraged to stop movement at any time if they feel discomfort or pain.   Affect/Mood: Appropriate   Participation Level: Active and Engaged   Participation Quality: Independent   Behavior: Alert and Appropriate   Speech/Thought Process: Coherent   Insight: Good   Judgement: Good   Modes of Intervention: Activity and Education   Patient Response to Interventions:  Attentive, Engaged, Interested , and Receptive   Education Outcome:  Acknowledges education   Clinical Observations/Individualized Feedback: Bruce Yoder was active in their participation of session activities and group discussion. Pt identified that he prefers strength exercises but found that yoga can help him relax some. Patient completed all movements and was attentive throughout group. Pt received two mini water bottles at the end of session and was encouraged to stay hydrated.    Plan: Continue to engage patient in RT group sessions 2-3x/week.   Vilma Prader, LRT, [place] 04/09/2022 2:46 PM

## 2022-04-09 NOTE — B[facility] Group Notes (Signed)
Date: 04/09/2022 Time: 10:00 AM - 11:30 AM    Group Topic: Coping Skills & Feelings   This MHT and pt reviewed a list of 100 coping skills and identified skills pt utilizes. Pt wrote their coping skills on Valentine hearts and decorated dayroom with hearts as a reminder to utilize these coping skills when feeling stressed, sad, anxious, etc. Pt then completed a therapeutic coloring activity and discussed pt's feelings with this MHT. Pt was cooperative and interacted appropriately throughout group.    Group Goals: Recognize feelings and identify coping skills        Participation Level: Active   Participation Quality: Appropriate and Attentive    Affect: Appropriate    Cognitive: Alert and Appropriate    Insight: Appropriate and Good   Engagement in Group: Engaged    Modes of Intervention: Activity and Discussion    Additional Comments:

## 2022-04-09 NOTE — Plan of Care (Signed)
Pt endorses anxiety/depression at this time, prn medication offered and refused. Pt reports that he feels his anxiety and depression are a little better than usual. Pt denies SI/HI/AVH or pain at this time. Pt is calm and cooperative. Pt is medication compliant. Pt provided with support and encouragement. Pt monitored q15 minutes for safety per unit policy. Plan of care ongoing.     Problem: Education: Goal: Knowledge of General Education information will improve Description: Including pain rating scale, medication(s)/side effects and non-pharmacologic comfort measures Outcome: Progressing   Problem: Coping: Goal: Level of anxiety will decrease Outcome: Not Progressing

## 2022-04-09 NOTE — Progress Notes (Signed)
[facility] MD Progress Note  04/09/2022 11:13 AM Bruce Yoder  MRN:  [id number] Subjective: Bruce Yoder is seen on rounds.  He states that he slept well last night.  He is doing well on doxepin at bedtime.  He still has a lot of racing negative thoughts.  He obsesses over his responsibilities in life.  Seems to be easily overwhelmed by it.  He is going through a divorce and has a hearing in April.  He is unable to take his mind off of his responsibilities and calm down.  He states that he woke up feeling fine but then started thinking about everything and that is when his anxiety got worse.  He has obsessive compulsive traits with his thinking.  I told him the medications he is on are correct and that he just needs to give it more time but in the meantime I will give him a low-dose antianxiety medication.  Principal Problem: Bipolar 1 disorder, mixed, severe ([place]) Diagnosis: Principal Problem:   Bipolar 1 disorder, mixed, severe (HCC) Active Problems:   Cluster B personality disorder in adult [facility])  Total Time spent with patient: 15 minutes  Past Psychiatric History:  He has 1 previous psychiatric admission at [facility] for suicidal ideation 20 years ago.  He was recently discharged from [facility] and has been following up in outpatient with Dr. Reece Levy for the past couple weeks.  Recently took an overdose of Seroquel that was prescribed by his PCP.       Past Medical History:  Past Medical History:  Diagnosis Date   Atrial fibrillation ([place])    a. diagnosed on 10/22/2017; b. successful DCCV on 12/14/2017; Paroxysmal Persistent   Basal cell carcinoma (BCC) of back    Dr. Elvera [facility]   CAD (coronary artery disease)    a. LHC 01/31/2018: OM2 55-60%, FFR 0.93. pLAD 25%   CKD (chronic kidney disease) stage 3, GFR 30-59 ml/min (HCC)    Unilateral kidney   Dilated cardiomyopathy -related to A. fib RVR; resolved 10/2017   Thought to be related to A. fib RVR: a) echo 11/03/2017 showed LVEF of 35-40%  with severe LVH --> follow-up echo January 2020 showed resolution with EF 55 to 60%.  Normal filling pressures.  Read as mild as opposed to severe LVH   Diverticulosis of colon    With polyps (Dr. Amedeo Plenty March 2017, followed by Dr. Glennon Hamilton)   Erectile dysfunction    Essential hypertension 2018   Gout    OSA on CPAP    [facility]   Status post nephrectomy 1965   Thrombocytopenia, unspecified ([place])    Platelet clumping (pseudothrombocytopenia)    Past Surgical History:  Procedure Laterality Date   APPENDECTOMY     During childhood   ATRIAL FIBRILLATION ABLATION N/A 10/11/2020   Procedure: ATRIAL FIBRILLATION ABLATION;  Surgeon: Constance Haw, MD;  Location: [place] [facility];  Service: Cardiovascular;  Laterality: N/A;   CARDIOVERSION N/A 12/14/2017   Procedure: CARDIOVERSION;  Surgeon: Sueanne Margarita, MD;  Location: [facility] ENDOSCOPY;  Service: Cardiovascular;  Laterality: N/A;   CARDIOVERSION N/A 07/04/2020   Procedure: CARDIOVERSION;  Surgeon: Sueanne Margarita, MD;  Location: [place];  Service: Cardiovascular;  Laterality: N/A;   CYST EXCISION     face   INTRAVASCULAR PRESSURE WIRE/FFR STUDY N/A 01/31/2018   Procedure: INTRAVASCULAR PRESSURE WIRE/FFR STUDY;  Surgeon: Leonie Man, MD;  Location: [place] [facility];  Service: Cardiovascular;  Laterality: N/A;   LEFT  HEART CATH AND CORONARY ANGIOGRAPHY N/A 01/31/2018   Procedure: LEFT HEART CATH AND CORONARY ANGIOGRAPHY;  Surgeon: Leonie Man, MD;  Location: [place] [facility];  Service: Cardiovascular: Single-vessel disease with roughly 60% lesion in major OM branch.  FFR negative.   NEPHRECTOMY Right 1965   TRANSTHORACIC ECHOCARDIOGRAM  10/2017    EF 35 -40% with severe LVH.  Diffuse global hypokinesis.  Mild aortic and mitral regurgitation.  Mild left and right atrial dilation.   TRANSTHORACIC ECHOCARDIOGRAM  02/2018   EF 55-60%, normal filling pressure. Mod LA dilation.  Mild to moderate TR.  -> resolution of cardiomyopathy   VASECTOMY  1989   Family History:  Family History  Problem Relation Age of Onset   Colon cancer Mother    Hypertension Father    Seizures Father    Stroke Father    Dementia Paternal Grandmother    Heart attack Paternal Grandfather    Family Psychiatric  History: Unremarkable Social History:  Social History   Substance and Sexual Activity  Alcohol Use Not Currently   Alcohol/week: 2.0 standard drinks of alcohol   Types: 1 Glasses of wine, 1 Cans of beer per week     Social History   Substance and Sexual Activity  Drug Use Never    Social History   Socioeconomic History   Marital status: Married    Spouse name: Not on file   Number of children: Not on file   Years of education: Not on file   Highest education level: Not on file  Occupational History   Not on file  Tobacco Use   Smoking status: Never   Smokeless tobacco: Never  Vaping Use   Vaping Use: Never used  Substance and Sexual Activity   Alcohol use: Not Currently    Alcohol/week: 2.0 standard drinks of alcohol    Types: 1 Glasses of wine, 1 Cans of beer per week   Drug use: Never   Sexual activity: Not on file  Other Topics Concern   Not on file  Social History Narrative   He is married now for 4 years (remarried).  He has 1 child (presumably from previous marriage -75 years old).   He lives with his wife.  He is an avid Chief Executive Officer.   He is a retired Producer, television/film/video for [employer].  (He has a BA in American studies at [employer].)   Never smoked.  Drinks 5-7 alcoholic beverages a week usually beer or hard cider.  May be occasionally a mixed drink.   He is quite active exercise at least 4 days a week for 30 minutes at a time.       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  He enjoys walking on both treadmill and in the community.  He plays golf routinely.   Social  Determinants of Health   Financial Resource Strain: Not on file  Food Insecurity: No Food Insecurity (04/04/2022)   Hunger Vital Sign    Worried About Running Out of Food in the Last Year: Never true    Ran Out of Food in the Last Year: Never true  Transportation Needs: No Transportation Needs (04/04/2022)   PRAPARE - Hydrologist (Medical): No    Lack of Transportation (Non-Medical): No  Physical Activity: Not on file  Stress: Not on file  Social Connections: Not on file   Additional Social  History:                         Sleep: Good  Appetite:  Good  Current Medications: Current Facility-Administered Medications  Medication Dose Route Frequency Provider Last Rate Last Admin   acetaminophen (TYLENOL) tablet 1,000 mg  1,000 mg Oral Q6H PRN Doren Custard, Rashaun M, NP       allopurinol (ZYLOPRIM) tablet 150 mg  150 mg Oral Daily Dixon, Rashaun M, NP   150 mg at 04/09/22 0904   alum & mag hydroxide-simeth (MAALOX/MYLANTA) 200-200-20 MG/5ML suspension 30 mL  30 mL Oral Q4H PRN Deloria Lair, NP       atorvastatin (LIPITOR) tablet 40 mg  40 mg Oral Daily Deloria Lair, NP   40 mg at 04/09/22 0904   doxepin (SINEQUAN) capsule 75 mg  75 mg Oral QHS Parks Ranger, DO   75 mg at 04/08/22 2108   hydrochlorothiazide (HYDRODIURIL) tablet 25 mg  25 mg Oral Daily Parks Ranger, DO   25 mg at 04/09/22 [id number]   LORazepam (ATIVAN) tablet 0.25 mg  0.25 mg Oral BH-q8a4p Parks Ranger, DO       LORazepam (ATIVAN) tablet 0.5 mg  0.5 mg Oral Q4H PRN Parks Ranger, DO   0.5 mg at 04/06/22 0933   losartan (COZAAR) tablet 100 mg  100 mg Oral Daily Parks Ranger, DO   100 mg at 04/09/22 0905   magnesium hydroxide (MILK OF MAGNESIA) suspension 30 mL  30 mL Oral Daily PRN Deloria Lair, NP   30 mL at 04/07/22 0828   metoprolol succinate (TOPROL-XL) 24 hr tablet 50 mg  50 mg Oral Daily Deloria Lair, NP   50 mg at  04/09/22 [id number]   multivitamin (RENA-VIT) tablet 1 tablet  1 tablet Oral QHS Deloria Lair, NP   1 tablet at 04/08/22 2109   risperiDONE (RISPERDAL) tablet 1 mg  1 mg Oral BH-q8a4p Parks Ranger, DO   1 mg at 04/09/22 [id number]   rivaroxaban (XARELTO) tablet 20 mg  20 mg Oral Q supper Anette Riedel M, NP   20 mg at 04/08/22 1629   traZODone (DESYREL) tablet 50 mg  50 mg Oral QHS PRN Deloria Lair, NP       venlafaxine XR (EFFEXOR-XR) 24 hr capsule 225 mg  225 mg Oral QPC breakfast Parks Ranger, DO   225 mg at 04/09/22 [id number]    Lab Results:  Results for orders placed or performed during the hospital encounter of 04/04/22 (from the past 48 hour(s))  Hemoglobin A1c     Status: None   Collection Time: 04/08/22  6:12 AM  Result Value Ref Range   Hgb A1c MFr Bld 4.8 4.8 - 5.6 %    Comment: (NOTE) Pre diabetes:          5.7%-6.4%  Diabetes:              >6.4%  Glycemic control for   <7.0% adults with diabetes    Mean Plasma Glucose 91.06 mg/dL    Comment: Performed at [facility], [place] [address] [place], [place] [address]  Lipid panel     Status: Abnormal   Collection Time: 04/08/22  6:12 AM  Result Value Ref Range   Cholesterol 90 0 - 200 mg/dL   Triglycerides 83 <150 mg/dL   HDL 35 (L) >40 mg/dL   Total CHOL/HDL Ratio 2.6 RATIO  VLDL 17 0 - 40 mg/dL   LDL Cholesterol 38 0 - 99 mg/dL    Comment:        Total Cholesterol/HDL:CHD Risk Coronary Heart Disease Risk Table                     Men   Women  1/2 Average Risk   3.4   3.3  Average Risk       5.0   4.4  2 X Average Risk   9.6   7.1  3 X Average Risk  23.4   11.0        Use the calculated Patient Ratio above and the CHD Risk Table to determine the patient's CHD Risk.        ATP III CLASSIFICATION (LDL):  <100     mg/dL   Optimal  100-129  mg/dL   Near or Above                    Optimal  130-159  mg/dL   Borderline  160-189  mg/dL   High  >190     mg/dL   Very High Performed at [address] [place], [place] [address]   SARS CORONAVIRUS 2 (TAT 6-24 HRS) Anterior Nasal Swab     Status: None   Collection Time: 04/08/22  4:26 PM   Specimen: Anterior Nasal Swab  Result Value Ref Range   SARS Coronavirus 2 NEGATIVE NEGATIVE    Comment: (NOTE) SARS-CoV-2 target nucleic acids are NOT DETECTED.  The SARS-CoV-2 RNA is generally detectable in upper and lower respiratory specimens during the acute phase of infection. Negative results do not preclude SARS-CoV-2 infection, do not rule out co-infections with other pathogens, and should not be used as the sole basis for treatment or other patient management decisions. Negative results must be combined with clinical observations, patient history, and epidemiological information. The expected result is Negative.  Fact Sheet for Patients: SugarRoll.be  Fact Sheet for Healthcare Providers: https://www.woods-mathews.com/  This test is not yet approved or cleared by the [place] FDA and  has been authorized for detection and/or diagnosis of SARS-CoV-2 by FDA under an Emergency Use Authorization (EUA). This EUA will remain  in effect (meaning this test can be used) for the duration of the COVID-19 declaration under Se ction 564(b)(1) of the Act, 21 U.S.C. section 360bbb-3(b)(1), unless the authorization is terminated or revoked sooner.  Performed at [facility], [place] [address] [place], [place] [address]     Blood Alcohol level:  Lab Results  Component Value Date   ETH <10 [id number]    Metabolic Disorder Labs: Lab Results  Component Value Date   HGBA1C 4.8 04/08/2022   MPG 91.06 04/08/2022   No results found for: "PROLACTIN" Lab Results  Component Value Date   CHOL 90 04/08/2022   TRIG 83 04/08/2022   HDL 35 (L) 04/08/2022   CHOLHDL 2.6 04/08/2022   VLDL 17 04/08/2022   LDLCALC 38 04/08/2022   LDLCALC 87 02/01/2018    Physical  Findings: AIMS:  , ,  ,  ,    CIWA:    COWS:     Musculoskeletal: Strength & Muscle Tone: within normal limits Gait & Station: normal Patient leans: N/A  Psychiatric Specialty Exam:  Presentation  General Appearance:  Appropriate for Environment  Eye Contact: Fair  Speech: Slurred  Speech Volume: Normal  Handedness: Right   Mood and Affect  Mood: Hopeless  Affect: Depressed; Flat   Thought Process  Thought Processes: Coherent  Descriptions of Associations:Intact  Orientation:Full (Time, Place and Person)  Thought Content:WDL  History of Schizophrenia/Schizoaffective disorder:No data recorded Duration of Psychotic Symptoms:No data recorded Hallucinations:No data recorded Ideas of Reference:None  Suicidal Thoughts:No data recorded Homicidal Thoughts:No data recorded  Sensorium  Memory: Immediate Good; Remote Good; Recent Good  Judgment: Fair  Insight: Good   Executive Functions  Concentration: Good  Attention Span: Good  Recall: Good  Fund of Knowledge: Good  Language: Good   Psychomotor Activity  Psychomotor Activity:No data recorded  Assets  Assets: Communication Skills; Financial Resources/Insurance; Housing   Sleep  Sleep:No data recorded   Physical Exam: Physical Exam Vitals and nursing note reviewed.  Constitutional:      Appearance: Normal appearance. He is normal weight.  Neurological:     General: No focal deficit present.     Mental Status: He is alert and oriented to person, place, and time.  Psychiatric:        Attention and Perception: Attention and perception normal.        Mood and Affect: Mood is depressed. Affect is flat.        Speech: Speech normal.        Behavior: Behavior is agitated. Behavior is cooperative.        Thought Content: Thought content normal.        Cognition and Memory: Cognition and memory normal.        Judgment: Judgment is inappropriate.    Review of Systems   Constitutional: Negative.   HENT: Negative.    Eyes: Negative.   Respiratory: Negative.    Cardiovascular: Negative.   Gastrointestinal: Negative.   Genitourinary: Negative.   Musculoskeletal: Negative.   Skin: Negative.   Neurological: Negative.   Endo/Heme/Allergies: Negative.   Psychiatric/Behavioral:  Positive for depression.    Blood pressure (!) 153/86, pulse 68, temperature 97.6 F (36.4 C), temperature source Oral, resp. rate 16, height 5' 10"$  (1.778 m), weight 104.3 kg, SpO2 98 %. Body mass index is 33 kg/m.   Treatment Plan Summary: Daily contact with patient to assess and evaluate symptoms and progress in treatment, Medication management, and Plan start Ativan 0.25 mg twice a day continue Risperdal Effexor and doxepin.  He  Parks Ranger, DO 04/09/2022, 11:13 AM

## 2022-04-10 DIAGNOSIS — F3163 Bipolar disorder, current episode mixed, severe, without psychotic features: Secondary | ICD-10-CM | POA: Diagnosis not present

## 2022-04-10 MED ORDER — SENNOSIDES-DOCUSATE SODIUM 8.6-50 MG PO TABS
2.0000 | ORAL_TABLET | Freq: Every day | ORAL | Status: DC
  Administered 2022-04-11 – 2022-04-15 (×5): 2 via ORAL
  Filled 2022-04-10 (×5): qty 2

## 2022-04-10 MED ORDER — VENLAFAXINE HCL ER 75 MG PO CP24
300.0000 mg | ORAL_CAPSULE | Freq: Every day | ORAL | Status: DC
  Administered 2022-04-11 – 2022-04-15 (×5): 300 mg via ORAL
  Filled 2022-04-10 (×5): qty 4

## 2022-04-10 MED ORDER — LORAZEPAM 0.5 MG PO TABS
0.5000 mg | ORAL_TABLET | ORAL | Status: DC
  Administered 2022-04-10 – 2022-04-15 (×10): 0.5 mg via ORAL
  Filled 2022-04-10 (×10): qty 1

## 2022-04-10 NOTE — BHH Counselor (Signed)
CSW contacted pt's daughter, Bruce Yoder, [phone], [email or web address]; regarding pt.  She stated she wanted to find additional resources for pt that she has not utilized and might be helpful.   CSW informed her that pt has medication management and individual therapy scheduled at discharge.   CSW provide a list of personal care services and community resource support at [facility] that can assist with proving information with level of care inquires.   She stated that she would be out of town until Tuesday and able to provide transportation and discharge support starting Wednesday.   Bruce Yoder, MSW, LCSW-A 2/16/20242:07 PM

## 2022-04-10 NOTE — BH Assessment (Signed)
1905 Received patient sitting in the dayroom watching TV. He is alert and oriented and in no distress at this time. Will continue to monitor patient for safety.  [id number] Patient received a visit from a friend and he appeared to be enjoying his visit.   2210 Patient continues to be medication compliant and received medication education.   [id number] Patient has rested quietly in bed most of this time. However, he has been up to the restroom several times during this time segment. Will continue to monitor for safety.  [place]

## 2022-04-10 NOTE — Progress Notes (Signed)
   04/10/22 2015  Psych Admission Type (Psych Patients Only)  Admission Status Involuntary  Psychosocial Assessment  Patient Complaints Anxiety;Depression  Eye Contact Fair  Facial Expression Anxious  Affect Anxious  Speech Logical/coherent  Interaction Assertive  Motor Activity Slow  Appearance/Hygiene Unremarkable  Behavior Characteristics Cooperative;Appropriate to situation;Anxious  Mood Anxious;Pleasant  Thought Process  Coherency WDL  Content WDL;Blaming self  Delusions None reported or observed  Perception WDL  Hallucination None reported or observed  Judgment WDL  Confusion None  Danger to Self  Current suicidal ideation? Denies  Agreement Not to Harm Self Yes  Description of Agreement verbal  Danger to Others  Danger to Others None reported or observed   Progress note   D: Pt seen in dayroom. Pt denies SI, HI, AVH. Pt rates pain  0/10. Pt rates anxiety  3/10 and depression  2-3/10. Pt was visited by an old friend that he says he has known for 84 years. "He lives in [place] and took the train to come here and see me for a few days." Pt states that he has been psychiatrically hospitalized a few times since leaving his wife last year. Pt stated he went to [place] in hopes of becoming a member of a band. Says he shouldn't have been so impulsive to leave his wife because of all the decisions he has to handle on his own. "She was upset with me because she said I was inadequate as a husband so I just left. I see now that was hasty. I am overwhelmed by all the changes like splitting up assets and taking care of things. If I had stayed with her I would be unhappy but not overwhelmed like I am now." Pt states he stopped his Zoloft just before moving to [place]. Pt educated on need to wean off of antidepressants to avoid adverse side effects. Pt believes new medications will help but doesn't see any change right now. He is also working on Consulting civil engineer. No other  concerns noted at this time.  A: Pt provided support and encouragement. Pt given scheduled medication as prescribed. PRNs as appropriate. Q15 min checks for safety.   R: Pt safe on the unit. Will continue to monitor.

## 2022-04-10 NOTE — Group Note (Signed)
Recreation Therapy Group Note   Group Topic:Coping Skills  Group Date: 04/10/2022 Start Time: 1400 End Time: 1445 Facilitators: Vilma Prader, LRT, CTRS Location:  Dayroom and Courtyard  Group Description: Coping skills. Patient and LRT went outside to the courtyard to get fresh air and sunlight. LRT and patient disucssed different coping skills as walking laps outside. Patient was given a paper with a list of 100 different coping skills after we came back inside.   Affect/Mood: Appropriate   Participation Level: Active and Engaged   Participation Quality: Independent   Behavior: Attentive  and Calm   Speech/Thought Process: Coherent and Directed   Insight: Good   Judgement: Good   Modes of Intervention: Activity   Patient Response to Interventions:  Attentive, Engaged, Interested , and Receptive   Education Outcome:  Acknowledges education   Clinical Observations/Individualized Feedback: Bhupinder was active in their participation of session activities and group discussion. Pt identified that he enjoys getting exercise when he is outside of the hospital and thanked the LRT for taking him outside and walking with him. During the walk, LRT played bluegrass music aloud and patient was heard singing along. Once we got back inside, LRT gave patient a sheet of 100 different coping skills and talked about how exercise/walking is one of them. LRT gave patient a crossword booklet for the weekend. Patient was polite and respectful throughout session.   Plan: Continue to engage patient in RT group sessions 2-3x/week.   Vilma Prader, LRT, CTRS 04/10/2022 3:05 PM

## 2022-04-10 NOTE — Plan of Care (Signed)
Pt endorses anxiety/depression at this time. Pt denies SI/HI/AVH or pain at this time. Pt is calm and cooperative. Pt is medication compliant. Pt provided with support and encouragement. Pt monitored q15 minutes for safety per unit policy. Plan of care ongoing.   Pt voiced concern about experiencing side affects from overdosing on Seroquel. Pt also shared how he felt he made a mistake divorcing his spouse d/t no longer being in a place of comfort, and having to find a new friend group.   Problem: Coping: Goal: Level of anxiety will decrease Outcome: Not Progressing   Problem: Education: Goal: Mental status will improve Outcome: Not Progressing

## 2022-04-10 NOTE — Group Note (Signed)
Date:  04/10/2022 Time:  3:16 PM  Group Topic/Focus:  Goals Group:   The focus of this group is to help patients establish daily goals to achieve during treatment and discuss how the patient can incorporate goal setting into their daily lives to aide in recovery. Healthy Communication:   The focus of this group is to discuss communication, barriers to communication, as well as healthy ways to communicate with others. Identifying Needs:   The focus of this group is to help patients identify their personal needs that have been historically problematic and identify healthy behaviors to address their needs. Making Healthy Choices:   The focus of this group is to help patients identify negative/unhealthy choices they were using prior to admission and identify positive/healthier coping strategies to replace them upon discharge.    Participation Level:  Active  Participation Quality:  Appropriate  Affect:  Appropriate  Cognitive:  Alert  Insight: Appropriate  Engagement in Group:  Engaged  Modes of Intervention:  Discussion  Additional Comments:    Ileene Musa 04/10/2022, 3:16 PM

## 2022-04-10 NOTE — Progress Notes (Signed)
[facility] MD Progress Note  04/10/2022 10:55 AM Bruce Yoder  MRN:  [id number] Subjective: Bruce Yoder is seen on rounds.  We fixed his insomnia and he is doing well on doxepin at bedtime.  Still has a lot of racing thoughts and anxiety.  No side effects from the low-dose of Ativan so I told him we will go up on it.  I also informed him that the Effexor will take some time to work but in the meantime we will go up on that too.  We have a long discussion about his stressors in life and how he has overcome them in the past.  That seemed to give him some insight.  Principal Problem: Bipolar 1 disorder, mixed, severe ([place]) Diagnosis: Principal Problem:   Bipolar 1 disorder, mixed, severe (HCC) Active Problems:   Cluster B personality disorder in adult [facility])  Total Time spent with patient: 15 minutes  Past Psychiatric History:  He has 1 previous psychiatric admission at [facility] for suicidal ideation 20 years ago.  He was recently discharged from [facility] and has been following up in outpatient with Dr. Reece Levy for the past couple weeks.  Recently took an overdose of Seroquel that was prescribed by his PCP.        Past Medical History:  Past Medical History:  Diagnosis Date   Atrial fibrillation ([place])    a. diagnosed on 10/22/2017; b. successful DCCV on 12/14/2017; Paroxysmal Persistent   Basal cell carcinoma (BCC) of back    Dr. Elvera Lennox   CAD (coronary artery disease)    a. LHC 01/31/2018: OM2 55-60%, FFR 0.93. pLAD 25%   CKD (chronic kidney disease) stage 3, GFR 30-59 ml/min (HCC)    Unilateral kidney   Dilated cardiomyopathy -related to A. fib RVR; resolved 10/2017   Thought to be related to A. fib RVR: a) echo 11/03/2017 showed LVEF of 35-40% with severe LVH --> follow-up echo January 2020 showed resolution with EF 55 to 60%.  Normal filling pressures.  Read as mild as opposed to severe LVH   Diverticulosis of colon    With polyps (Dr. Amedeo Plenty March 2017, followed by Dr. Glennon Hamilton)    Erectile dysfunction    Essential hypertension 2018   Gout    OSA on CPAP    [facility]   Status post nephrectomy 1965   Thrombocytopenia, unspecified ([place])    Platelet clumping (pseudothrombocytopenia)    Past Surgical History:  Procedure Laterality Date   APPENDECTOMY     During childhood   ATRIAL FIBRILLATION ABLATION N/A 10/11/2020   Procedure: ATRIAL FIBRILLATION ABLATION;  Surgeon: Constance Haw, MD;  Location: [place] [facility];  Service: Cardiovascular;  Laterality: N/A;   CARDIOVERSION N/A 12/14/2017   Procedure: CARDIOVERSION;  Surgeon: Sueanne Margarita, MD;  Location: [facility] ENDOSCOPY;  Service: Cardiovascular;  Laterality: N/A;   CARDIOVERSION N/A 07/04/2020   Procedure: CARDIOVERSION;  Surgeon: Sueanne Margarita, MD;  Location: [place];  Service: Cardiovascular;  Laterality: N/A;   CYST EXCISION     face   INTRAVASCULAR PRESSURE WIRE/FFR STUDY N/A 01/31/2018   Procedure: INTRAVASCULAR PRESSURE WIRE/FFR STUDY;  Surgeon: Leonie Man, MD;  Location: [place] [facility];  Service: Cardiovascular;  Laterality: N/A;   LEFT HEART CATH AND CORONARY ANGIOGRAPHY N/A 01/31/2018   Procedure: LEFT HEART CATH AND CORONARY ANGIOGRAPHY;  Surgeon: Leonie Man, MD;  Location: [place] [facility];  Service: Cardiovascular: Single-vessel disease with roughly 60% lesion in major OM  branch.  FFR negative.   NEPHRECTOMY Right 1965   TRANSTHORACIC ECHOCARDIOGRAM  10/2017    EF 35 -40% with severe LVH.  Diffuse global hypokinesis.  Mild aortic and mitral regurgitation.  Mild left and right atrial dilation.   TRANSTHORACIC ECHOCARDIOGRAM  02/2018   EF 55-60%, normal filling pressure. Mod LA dilation.  Mild to moderate TR. -> resolution of cardiomyopathy   VASECTOMY  1989   Family History:  Family History  Problem Relation Age of Onset   Colon cancer Mother    Hypertension Father    Seizures Father    Stroke Father    Dementia Paternal Grandmother     Heart attack Paternal Grandfather    Family Psychiatric  History: Unremarkable Social History:  Social History   Substance and Sexual Activity  Alcohol Use Not Currently   Alcohol/week: 2.0 standard drinks of alcohol   Types: 1 Glasses of wine, 1 Cans of beer per week     Social History   Substance and Sexual Activity  Drug Use Never    Social History   Socioeconomic History   Marital status: Married    Spouse name: Not on file   Number of children: Not on file   Years of education: Not on file   Highest education level: Not on file  Occupational History   Not on file  Tobacco Use   Smoking status: Never   Smokeless tobacco: Never  Vaping Use   Vaping Use: Never used  Substance and Sexual Activity   Alcohol use: Not Currently    Alcohol/week: 2.0 standard drinks of alcohol    Types: 1 Glasses of wine, 1 Cans of beer per week   Drug use: Never   Sexual activity: Not on file  Other Topics Concern   Not on file  Social History Narrative   He is married now for 4 years (remarried).  He has 1 child (presumably from previous marriage -75 years old).   He lives with his wife.  He is an avid Chief Executive Officer.   He is a retired Producer, television/film/video for [employer].  (He has a BA in American studies at [employer].)   Never smoked.  Drinks 5-7 alcoholic beverages a week usually beer or hard cider.  May be occasionally a mixed drink.   He is quite active exercise at least 4 days a week for 30 minutes at a time.       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  He enjoys walking on both treadmill and in the community.  He plays golf routinely.   Social Determinants of Health   Financial Resource Strain: Not on file  Food Insecurity: No Food Insecurity (04/04/2022)   Hunger Vital Sign    Worried About Running Out of Food in the Last Year: Never true    Ran Out of Food in the Last Year:  Never true  Transportation Needs: No Transportation Needs (04/04/2022)   PRAPARE - Hydrologist (Medical): No    Lack of Transportation (Non-Medical): No  Physical Activity: Not on file  Stress: Not on file  Social Connections: Not on file   Additional Social History:                         Sleep: Good  Appetite:  Good  Current Medications: Current Facility-Administered Medications  Medication Dose  Route Frequency Provider Last Rate Last Admin   acetaminophen (TYLENOL) tablet 1,000 mg  1,000 mg Oral Q6H PRN Deloria Lair, NP       allopurinol (ZYLOPRIM) tablet 150 mg  150 mg Oral Daily Dixon, Rashaun M, NP   150 mg at 04/10/22 0924   alum & mag hydroxide-simeth (MAALOX/MYLANTA) 200-200-20 MG/5ML suspension 30 mL  30 mL Oral Q4H PRN Deloria Lair, NP       atorvastatin (LIPITOR) tablet 40 mg  40 mg Oral Daily Dixon, Rashaun M, NP   40 mg at 04/10/22 0924   doxepin (SINEQUAN) capsule 75 mg  75 mg Oral QHS Parks Ranger, DO   75 mg at 04/09/22 2217   hydrochlorothiazide (HYDRODIURIL) tablet 25 mg  25 mg Oral Daily Parks Ranger, DO   25 mg at 04/10/22 [id number]   LORazepam (ATIVAN) tablet 0.5 mg  0.5 mg Oral Q4H PRN Parks Ranger, DO   0.5 mg at 04/10/22 1021   LORazepam (ATIVAN) tablet 0.5 mg  0.5 mg Oral BH-q8a4p Camaryn Lumbert Edward, DO       losartan (COZAAR) tablet 100 mg  100 mg Oral Daily Parks Ranger, DO   100 mg at 04/10/22 [id number]   magnesium hydroxide (MILK OF MAGNESIA) suspension 30 mL  30 mL Oral Daily PRN Deloria Lair, NP   30 mL at 04/10/22 0839   metoprolol succinate (TOPROL-XL) 24 hr tablet 50 mg  50 mg Oral Daily Anette Riedel M, NP   50 mg at 04/10/22 [id number]   multivitamin (RENA-VIT) tablet 1 tablet  1 tablet Oral QHS Deloria Lair, NP   1 tablet at 04/09/22 2217   risperiDONE (RISPERDAL) tablet 1 mg  1 mg Oral BH-q8a4p Parks Ranger, DO   1 mg at 04/10/22 [id number]   rivaroxaban  (XARELTO) tablet 20 mg  20 mg Oral Q supper Anette Riedel M, NP   20 mg at 04/09/22 1628   traZODone (DESYREL) tablet 50 mg  50 mg Oral QHS PRN Deloria Lair, NP       [START ON 04/11/2022] venlafaxine XR (EFFEXOR-XR) 24 hr capsule 300 mg  300 mg Oral QPC breakfast Parks Ranger, DO        Lab Results:  Results for orders placed or performed during the hospital encounter of 04/04/22 (from the past 48 hour(s))  SARS CORONAVIRUS 2 (TAT 6-24 HRS) Anterior Nasal Swab     Status: None   Collection Time: 04/08/22  4:26 PM   Specimen: Anterior Nasal Swab  Result Value Ref Range   SARS Coronavirus 2 NEGATIVE NEGATIVE    Comment: (NOTE) SARS-CoV-2 target nucleic acids are NOT DETECTED.  The SARS-CoV-2 RNA is generally detectable in upper and lower respiratory specimens during the acute phase of infection. Negative results do not preclude SARS-CoV-2 infection, do not rule out co-infections with other pathogens, and should not be used as the sole basis for treatment or other patient management decisions. Negative results must be combined with clinical observations, patient history, and epidemiological information. The expected result is Negative.  Fact Sheet for Patients: SugarRoll.be  Fact Sheet for Healthcare Providers: https://www.woods-mathews.com/  This test is not yet approved or cleared by the [place] FDA and  has been authorized for detection and/or diagnosis of SARS-CoV-2 by FDA under an Emergency Use Authorization (EUA). This EUA will remain  in effect (meaning this test can be used) for the duration of the COVID-19 declaration under Se ction 564(b)(1)  of the Act, 21 U.S.C. section 360bbb-3(b)(1), unless the authorization is terminated or revoked sooner.  Performed at [facility], [place] [address], [place], [place] [address]     Blood Alcohol level:  Lab Results  Component Value Date   ETH <10 [id number]     Metabolic Disorder Labs: Lab Results  Component Value Date   HGBA1C 4.8 04/08/2022   MPG 91.06 04/08/2022   No results found for: "PROLACTIN" Lab Results  Component Value Date   CHOL 90 04/08/2022   TRIG 83 04/08/2022   HDL 35 (L) 04/08/2022   CHOLHDL 2.6 04/08/2022   VLDL 17 04/08/2022   LDLCALC 38 04/08/2022   LDLCALC 87 02/01/2018    Physical Findings: AIMS:  , ,  ,  ,    CIWA:    COWS:     Musculoskeletal: Strength & Muscle Tone: within normal limits Gait & Station: normal Patient leans: N/A  Psychiatric Specialty Exam:  Presentation  General Appearance:  Appropriate for Environment  Eye Contact: Fair  Speech: Slurred  Speech Volume: Normal  Handedness: Right   Mood and Affect  Mood: Hopeless  Affect: Depressed; Flat   Thought Process  Thought Processes: Coherent  Descriptions of Associations:Intact  Orientation:Full (Time, Place and Person)  Thought Content:WDL  History of Schizophrenia/Schizoaffective disorder:No data recorded Duration of Psychotic Symptoms:No data recorded Hallucinations:No data recorded Ideas of Reference:None  Suicidal Thoughts:No data recorded Homicidal Thoughts:No data recorded  Sensorium  Memory: Immediate Good; Remote Good; Recent Good  Judgment: Fair  Insight: Good   Executive Functions  Concentration: Good  Attention Span: Good  Recall: Good  Fund of Knowledge: Good  Language: Good   Psychomotor Activity  Psychomotor Activity:No data recorded  Assets  Assets: Communication Skills; Financial Resources/Insurance; Housing   Sleep  Sleep:No data recorded   Physical Exam: Physical Exam Vitals and nursing note reviewed.  Constitutional:      Appearance: Normal appearance. He is normal weight.  Neurological:     General: No focal deficit present.     Mental Status: He is alert and oriented to person, place, and time.  Psychiatric:        Attention and Perception:  Attention and perception normal.        Mood and Affect: Mood is depressed. Affect is flat.        Speech: Speech normal.        Behavior: Behavior normal. Behavior is cooperative.        Thought Content: Thought content normal.        Cognition and Memory: Cognition and memory normal.        Judgment: Judgment normal.    Review of Systems  Constitutional: Negative.   HENT: Negative.    Eyes: Negative.   Respiratory: Negative.    Cardiovascular: Negative.   Gastrointestinal: Negative.   Genitourinary: Negative.   Musculoskeletal: Negative.   Skin: Negative.   Neurological: Negative.   Endo/Heme/Allergies: Negative.   Psychiatric/Behavioral:  Positive for depression. The patient is nervous/anxious.    Blood pressure 133/83, pulse 72, temperature 97.6 F (36.4 C), resp. rate 18, height 5' 10"$  (1.778 m), weight 104.3 kg, SpO2 97 %. Body mass index is 33 kg/m.   Treatment Plan Summary: Daily contact with patient to assess and evaluate symptoms and progress in treatment, Medication management, and Plan increase Ativan to 0.5 mg twice a day and increase Effexor XR to 300 mg/day.  Parks Ranger, DO 04/10/2022, 10:55 AM

## 2022-04-11 DIAGNOSIS — F3163 Bipolar disorder, current episode mixed, severe, without psychotic features: Secondary | ICD-10-CM | POA: Diagnosis not present

## 2022-04-11 MED ORDER — SENNOSIDES-DOCUSATE SODIUM 8.6-50 MG PO TABS
2.0000 | ORAL_TABLET | Freq: Every day | ORAL | Status: DC | PRN
  Administered 2022-04-14: 2 via ORAL
  Filled 2022-04-11 (×2): qty 2

## 2022-04-11 MED ORDER — DOCUSATE SODIUM 100 MG PO CAPS
100.0000 mg | ORAL_CAPSULE | Freq: Two times a day (BID) | ORAL | Status: DC
  Administered 2022-04-11 – 2022-04-15 (×9): 100 mg via ORAL
  Filled 2022-04-11 (×9): qty 1

## 2022-04-11 NOTE — Group Note (Signed)
LCSW Group Therapy Note   Group Date: 04/11/2022 Start Time: 1300 End Time: 1345   Type of Therapy/Topic: Identifying Irrational Beliefs/Thoughts  Participation Level: Active  Description of Group: The purpose of this group is to assist patients in learning to identify irrational beliefs and thoughts that contribute to their negative emotions and experience positive emotions. Patients will be guided to discuss ways in which they have been effected by irrational thoughts and beliefs and how to transform those irrational beliefs into rational ones. Newly identified rational beliefs will be juxtaposed with experiences of positive emotions or situations, and patients will be challenged to use rational beliefs or thoughts to combat negative ones. Special emphasis will be placed on coping with irrational beliefs in conflict situations, and patients will process healthy conflict resolution skills.  Therapeutic Goals: 1. Patient will identify two irrational thoughts or beliefs  to reflect on in order to balance out those thoughts 2. Patient will label two or more irrational thoughts/beliefs that they find the most difficult to cope with 3. Patient will demonstrate positive conflict resolution skills through discussion and/or role plays that will assist in transforming irrational thoughts or beliefs into positive ones.  Summary of Patient Progress: Pt attended and was active in group. Pt was able to share example of evidence contradicting thoughts.    Therapeutic Modalities: Cognitive Behavioral Therapy Feelings Identification Dialectical Behavioral Therapy    Darletta Moll MSW, LCSW Clincal Social Worker

## 2022-04-11 NOTE — Plan of Care (Signed)
  Problem: Health Behavior/Discharge Planning: Goal: Ability to manage health-related needs will improve Outcome: Progressing   Problem: Clinical Measurements: Goal: Ability to maintain clinical measurements within normal limits will improve Outcome: Progressing Goal: Will remain free from infection Outcome: Progressing   Problem: Nutrition: Goal: Adequate nutrition will be maintained Outcome: Progressing   Problem: Coping: Goal: Level of anxiety will decrease Outcome: Progressing   Problem: Elimination: Goal: Will not experience complications related to bowel motility Outcome: Progressing   Problem: Pain Managment: Goal: General experience of comfort will improve Outcome: Progressing   Problem: Skin Integrity: Goal: Risk for impaired skin integrity will decrease Outcome: Progressing

## 2022-04-11 NOTE — Plan of Care (Signed)

## 2022-04-11 NOTE — Group Note (Signed)
Date:  04/11/2022 Time:  10:33 AM  Group Topic/Focus:  Healthy Communication:   The focus of this group is to discuss communication, barriers to communication, as well as healthy ways to communicate with others. Managing Feelings:   The focus of this group is to identify what feelings patients have difficulty handling and develop a plan to handle them in a healthier way upon discharge. Overcoming Stress:   The focus of this group is to define stress and help patients assess their triggers.    Participation Level:  Active  Participation Quality:  Appropriate  Affect:  Appropriate  Cognitive:  Alert  Insight: Appropriate  Engagement in Group:  Engaged  Modes of Intervention:  Discussion and Socialization  Additional Comments:    Bruce Yoder l Bruce Yoder 04/11/2022, 10:33 AM

## 2022-04-11 NOTE — BH IP Treatment Plan (Signed)
Interdisciplinary Treatment and Diagnostic Plan Update  04/11/2022 Time of Session: 10:50a, Bruce Yoder MRN: [id number]  Principal Diagnosis: Bipolar 1 disorder, mixed, severe ([place])  Secondary Diagnoses: Principal Problem:   Bipolar 1 disorder, mixed, severe ([place]) Active Problems:   Cluster B personality disorder in adult [facility])   Current Medications:  Current Facility-Administered Medications  Medication Dose Route Frequency Provider Last Rate Last Admin   acetaminophen (TYLENOL) tablet 1,000 mg  1,000 mg Oral Q6H PRN Deloria Lair, NP       allopurinol (ZYLOPRIM) tablet 150 mg  150 mg Oral Daily Doren Custard, Rashaun M, NP   150 mg at 04/11/22 0919   alum & mag hydroxide-simeth (MAALOX/MYLANTA) 200-200-20 MG/5ML suspension 30 mL  30 mL Oral Q4H PRN Deloria Lair, NP       atorvastatin (LIPITOR) tablet 40 mg  40 mg Oral Daily Deloria Lair, NP   40 mg at 04/11/22 0918   doxepin (SINEQUAN) capsule 75 mg  75 mg Oral QHS Parks Ranger, DO   75 mg at 04/10/22 2127   hydrochlorothiazide (HYDRODIURIL) tablet 25 mg  25 mg Oral Daily Parks Ranger, DO   25 mg at 04/11/22 [id number]   LORazepam (ATIVAN) tablet 0.5 mg  0.5 mg Oral Q4H PRN Parks Ranger, DO   0.5 mg at 04/10/22 1021   LORazepam (ATIVAN) tablet 0.5 mg  0.5 mg Oral BH-[id number] Parks Ranger, DO   0.5 mg at 04/11/22 0919   losartan (COZAAR) tablet 100 mg  100 mg Oral Daily Parks Ranger, DO   100 mg at 04/11/22 [id number]   magnesium hydroxide (MILK OF MAGNESIA) suspension 30 mL  30 mL Oral Daily PRN Deloria Lair, NP   30 mL at 04/10/22 0839   metoprolol succinate (TOPROL-XL) 24 hr tablet 50 mg  50 mg Oral Daily Anette Riedel M, NP   50 mg at 04/11/22 [id number]   multivitamin (RENA-VIT) tablet 1 tablet  1 tablet Oral QHS Deloria Lair, NP   1 tablet at 04/10/22 2127   risperiDONE (RISPERDAL) tablet 1 mg  1 mg Oral BH-q8a4p Parks Ranger, DO   1 mg at 04/11/22 [id number]   rivaroxaban  (XARELTO) tablet 20 mg  20 mg Oral Q supper Anette Riedel M, NP   20 mg at 04/10/22 1701   senna-docusate (Senokot-S) tablet 2 tablet  2 tablet Oral QPC breakfast Parks Ranger, DO   2 tablet at 04/11/22 0919   traZODone (DESYREL) tablet 50 mg  50 mg Oral QHS PRN Deloria Lair, NP       venlafaxine XR (EFFEXOR-XR) 24 hr capsule 300 mg  300 mg Oral QPC breakfast Parks Ranger, DO   300 mg at 04/11/22 [id number]   PTA Medications: Medications Prior to Admission  Medication Sig Dispense Refill Last Dose   acetaminophen (TYLENOL) 500 MG tablet Take 1,000 mg by mouth every 6 (six) hours as needed for moderate pain.      allopurinol (ZYLOPRIM) 300 MG tablet Take 150 mg by mouth daily.       ARIPiprazole (ABILIFY) 2 MG tablet Take 2 mg by mouth daily.      atorvastatin (LIPITOR) 40 MG tablet Take 1 tablet (40 mg total) by mouth daily. 90 tablet 3    b complex-vitamin c-folic acid (NEPHRO-VITE) 0.8 MG TABS tablet Take 1 tablet by mouth 2 (two) times daily.      clonazePAM (KLONOPIN) 0.5 MG tablet Take 0.5 mg by mouth  2 (two) times daily as needed for anxiety.      hydrOXYzine (ATARAX) 25 MG tablet Take 25 mg by mouth 2 (two) times daily as needed for anxiety.      loratadine (CLARITIN) 10 MG tablet Take 10 mg by mouth daily as needed for allergies.      losartan (COZAAR) 50 MG tablet Take 1 tablet (50 mg total) by mouth daily. 90 tablet 3    metoprolol succinate (TOPROL-XL) 50 MG 24 hr tablet TAKE 1 TABLET BY MOUTH ONCE DAILY WITH MEALS 90 tablet 3    mirtazapine (REMERON) 15 MG tablet Take 15 mg by mouth at bedtime.      QUEtiapine (SEROQUEL) 50 MG tablet Take 50 mg by mouth at bedtime.      sertraline (ZOLOFT) 50 MG tablet Take 150 mg by mouth daily.      XARELTO 20 MG TABS tablet TAKE 1 TABLET DAILY WITH SUPPER. (Patient taking differently: Take 20 mg by mouth daily with supper.) 90 tablet 1     Patient Stressors: Health problems   Marital or family conflict    Patient  Strengths: Average or above average intelligence  Capable of independent living   Treatment Modalities: Medication Management, Group therapy, Case management,  1 to 1 session with clinician, Psychoeducation, Recreational therapy.   Physician Treatment Plan for Primary Diagnosis: Bipolar 1 disorder, mixed, severe ([place]) Long Term Goal(s): Improvement in symptoms so as ready for discharge   Short Term Goals: Ability to identify changes in lifestyle to reduce recurrence of condition will improve Ability to verbalize feelings will improve Ability to disclose and discuss suicidal ideas Ability to demonstrate self-control will improve Ability to identify and develop effective coping behaviors will improve Ability to maintain clinical measurements within normal limits will improve Compliance with prescribed medications will improve Ability to identify triggers associated with substance abuse/mental health issues will improve  Medication Management: Evaluate patient's response, side effects, and tolerance of medication regimen.  Therapeutic Interventions: 1 to 1 sessions, Unit Group sessions and Medication administration.  Evaluation of Outcomes: Progressing  Physician Treatment Plan for Secondary Diagnosis: Principal Problem:   Bipolar 1 disorder, mixed, severe ([place]) Active Problems:   Cluster B personality disorder in adult [facility])  Long Term Goal(s): Improvement in symptoms so as ready for discharge   Short Term Goals: Ability to identify changes in lifestyle to reduce recurrence of condition will improve Ability to verbalize feelings will improve Ability to disclose and discuss suicidal ideas Ability to demonstrate self-control will improve Ability to identify and develop effective coping behaviors will improve Ability to maintain clinical measurements within normal limits will improve Compliance with prescribed medications will improve Ability to identify triggers associated with  substance abuse/mental health issues will improve     Medication Management: Evaluate patient's response, side effects, and tolerance of medication regimen.  Therapeutic Interventions: 1 to 1 sessions, Unit Group sessions and Medication administration.  Evaluation of Outcomes: Progressing   RN Treatment Plan for Primary Diagnosis: Bipolar 1 disorder, mixed, severe ([place]) Long Term Goal(s): Knowledge of disease and therapeutic regimen to maintain health will improve  Short Term Goals: Ability to remain free from injury will improve, Ability to verbalize frustration and anger appropriately will improve, Ability to demonstrate self-control, and Ability to participate in decision making will improve  Medication Management: RN will administer medications as ordered by provider, will assess and evaluate patient's response and provide education to patient for prescribed medication. RN will report any adverse and/or side effects  to prescribing provider.  Therapeutic Interventions: 1 on 1 counseling sessions, Psychoeducation, Medication administration, Evaluate responses to treatment, Monitor vital signs and CBGs as ordered, Perform/monitor CIWA, COWS, AIMS and Fall Risk screenings as ordered, Perform wound care treatments as ordered.  Evaluation of Outcomes: Progressing   LCSW Treatment Plan for Primary Diagnosis: Bipolar 1 disorder, mixed, severe ([place]) Long Term Goal(s): Safe transition to appropriate next level of care at discharge, Engage patient in therapeutic group addressing interpersonal concerns.  Short Term Goals: Engage patient in aftercare planning with referrals and resources, Increase social support, Increase ability to appropriately verbalize feelings, Increase emotional regulation, and Facilitate acceptance of mental health diagnosis and concerns  Therapeutic Interventions: Assess for all discharge needs, 1 to 1 time with Social worker, Explore available resources and support systems,  Assess for adequacy in community support network, Educate family and significant other(s) on suicide prevention, Complete Psychosocial Assessment, Interpersonal group therapy.  Evaluation of Outcomes: Progressing   Progress in Treatment: Attending groups: Yes. Participating in groups: Yes. Taking medication as prescribed: Yes. Toleration medication: Yes. Family/Significant other contact made: Yes, individual(s) contacted:  Pt's daughter, Bruce Yoder Patient understands diagnosis: Yes. Discussing patient identified problems/goals with staff: Yes. Medical problems stabilized or resolved: Yes. Denies suicidal/homicidal ideation: Yes. Issues/concerns per patient self-inventory: No. Other: None   New problem(s) identified: No, Describe:  None Update 04/11/2022: No changes at this time.   New Short Term/Long Term Goal(s): Patient to work towards detox, medication management for mood stabilization; elimination of SI thoughts; development of comprehensive mental wellness plan.  Update 04/11/2022: No changes at this time.   Patient Goals: "overwhelmed" Update 04/11/2022: No changes at this time.   Discharge Plan or Barriers: CSW will assist pt with development of appropriate discharge/after care plan. Update 04/11/2022: Pt will be receiving therapy and medication through [facility]. Daughter is able to provide transportation for patient on Wednesday. Daughter plans to explore in-home care/ placement resources.    Reason for Continuation of Hospitalization: Anxiety Depression Medication stabilization   Estimated Length of Stay: 1-7 days Update 04/11/2022: No changes at this time.  Last 3 [place] Suicide Severity Risk Score: Flowsheet Row Admission (Current) from 04/04/2022 in [place] ED from 04/02/2022 in [facility] Emergency Department [facility] Admission (Discharged) from 10/11/2020 in [place] CATH LAB  C-SSRS RISK CATEGORY Moderate  Risk High Risk No Risk       Last PHQ 2/9 Scores:    10/21/2020    8:27 AM  Depression screen PHQ 2/9  Decreased Interest 0  Down, Depressed, Hopeless 0  PHQ - 2 Score 0    Scribe for Treatment Team: Vassie Moselle, LCSW 04/11/2022 10:51 AM

## 2022-04-11 NOTE — Group Note (Signed)
Date:  04/11/2022 Time:  9:00 PM  Group Topic/Focus:  Dimensions of Wellness:  The focus of this group is to introduce the topic of wellness and discuss the role each dimension of wellness plays in total health.   The wellness wheel given to each patient allowed them to reflect on their physical, mental, social, financial, occupational, recreational and spiritual wellness. We followed up with a discussion on the areas least scored & needed most improvement. The group was concluded with proper actions to follow to see improvement within the next 3-6 months. Participation Level:  Active  Participation Quality:  Appropriate  Affect:  Appropriate  Cognitive:  Appropriate  Insight: Appropriate and Good  Engagement in Group:  Engaged  Modes of Intervention:  Activity  Additional Comments:  Patient was engaged in group.    Wakhaila h Peyton Spengler 04/11/2022, 9:00 PM

## 2022-04-11 NOTE — Progress Notes (Signed)
   04/11/22 2130  Psych Admission Type (Psych Patients Only)  Admission Status Involuntary  Psychosocial Assessment  Patient Complaints Anxiety;Depression  Eye Contact Fair  Facial Expression Anxious  Affect Anxious  Speech Logical/coherent  Interaction Assertive  Motor Activity Other (Comment) (wnl)  Appearance/Hygiene Unremarkable  Behavior Characteristics Cooperative;Appropriate to situation  Mood Pleasant  Thought Process  Coherency WDL  Content WDL  Delusions None reported or observed  Perception WDL  Hallucination None reported or observed  Judgment WDL  Confusion None  Danger to Self  Current suicidal ideation? Denies  Agreement Not to Harm Self Yes  Description of Agreement verbal  Danger to Others  Danger to Others None reported or observed   Progress note   D: Pt seen in dayroom visiting with a friend. Pt denies SI, HI, AVH. Contracts for safety. Pt rates pain  0/10. Pt rates anxiety  4/10 and depression 4/10. When asked if he was ready for discharge, pt responded, "I hope so." Discussed various coping skills patient will work on including: therapy, taking medication as prescribed, meeting with friends, playing music, attending events in the community, participating in more hobbies. Pt states he spoke with daughter today and she will be able to pick him up on Tuesday or Wednesday depending on when he is to be discharged.  Pt did c/o constipation. Normal bowel pattern is one BM a day. Discussed effects of eating different diet here as well as his overdose medication. Pt verbalized understanding. No other concerns noted at this time.  A: Pt provided support and encouragement. Pt given scheduled medication as prescribed. PRNs as appropriate. Q15 min checks for safety.   R: Pt safe on the unit. Will continue to monitor.

## 2022-04-11 NOTE — Progress Notes (Signed)
Patient pleasant with anxious affect.  Anxiety rated 4/10.  Depression rated 3/10.  Denies SI/HI and AVH.  Denies pain.  Patient reports he slept well and has a good appetite.  Compliant with scheduled medications. 15 min checks in place for safety.  Patient actively participates in groups and is present in the milieu.  Appropriate interaction with peers and staff.

## 2022-04-11 NOTE — Progress Notes (Signed)
[facility] MD Progress Note  04/11/2022 1:17 PM Bruce Yoder  MRN:  [id number] Subjective: Patient seen for follow-up.  77 year old man with a diagnosis of bipolar disorder came into the hospital with an overdose on Seroquel.  Patient tells me he came in because he took too much Seroquel.  He tells me that his mood is feeling better.  Denies suicidal thoughts now.  Denies feeling depressed.  Denies hallucinations.  Complains of constipation although he did have a bowel movement in the last days that it remains hard and difficult and uncomfortable.  Vital signs stable.  No new labs. Principal Problem: Bipolar 1 disorder, mixed, severe (HCC) Diagnosis: Principal Problem:   Bipolar 1 disorder, mixed, severe (HCC) Active Problems:   Cluster B personality disorder in adult [facility])  Total Time spent with patient: 30 minutes  Past Psychiatric History: Past history of a diagnosis of bipolar disorder  Past Medical History:  Past Medical History:  Diagnosis Date   Atrial fibrillation ([place])    a. diagnosed on 10/22/2017; b. successful DCCV on 12/14/2017; Paroxysmal Persistent   Basal cell carcinoma (BCC) of back    Dr. Elvera Lennox   CAD (coronary artery disease)    a. LHC 01/31/2018: OM2 55-60%, FFR 0.93. pLAD 25%   CKD (chronic kidney disease) stage 3, GFR 30-59 ml/min (HCC)    Unilateral kidney   Dilated cardiomyopathy -related to A. fib RVR; resolved 10/2017   Thought to be related to A. fib RVR: a) echo 11/03/2017 showed LVEF of 35-40% with severe LVH --> follow-up echo January 2020 showed resolution with EF 55 to 60%.  Normal filling pressures.  Read as mild as opposed to severe LVH   Diverticulosis of colon    With polyps (Dr. Amedeo Plenty March 2017, followed by Dr. Glennon Hamilton)   Erectile dysfunction    Essential hypertension 2018   Gout    OSA on CPAP    [facility]   Status post nephrectomy 1965   Thrombocytopenia, unspecified ([place])    Platelet clumping (pseudothrombocytopenia)     Past Surgical History:  Procedure Laterality Date   APPENDECTOMY     During childhood   ATRIAL FIBRILLATION ABLATION N/A 10/11/2020   Procedure: ATRIAL FIBRILLATION ABLATION;  Surgeon: Constance Haw, MD;  Location: [place] [facility];  Service: Cardiovascular;  Laterality: N/A;   CARDIOVERSION N/A 12/14/2017   Procedure: CARDIOVERSION;  Surgeon: Sueanne Margarita, MD;  Location: [facility] ENDOSCOPY;  Service: Cardiovascular;  Laterality: N/A;   CARDIOVERSION N/A 07/04/2020   Procedure: CARDIOVERSION;  Surgeon: Sueanne Margarita, MD;  Location: [place];  Service: Cardiovascular;  Laterality: N/A;   CYST EXCISION     face   INTRAVASCULAR PRESSURE WIRE/FFR STUDY N/A 01/31/2018   Procedure: INTRAVASCULAR PRESSURE WIRE/FFR STUDY;  Surgeon: Leonie Man, MD;  Location: [place] [facility];  Service: Cardiovascular;  Laterality: N/A;   LEFT HEART CATH AND CORONARY ANGIOGRAPHY N/A 01/31/2018   Procedure: LEFT HEART CATH AND CORONARY ANGIOGRAPHY;  Surgeon: Leonie Man, MD;  Location: [place] [facility];  Service: Cardiovascular: Single-vessel disease with roughly 60% lesion in major OM branch.  FFR negative.   NEPHRECTOMY Right 1965   TRANSTHORACIC ECHOCARDIOGRAM  10/2017    EF 35 -40% with severe LVH.  Diffuse global hypokinesis.  Mild aortic and mitral regurgitation.  Mild left and right atrial dilation.   TRANSTHORACIC ECHOCARDIOGRAM  02/2018   EF 55-60%, normal filling pressure. Mod LA dilation.  Mild to moderate TR. -> resolution of  cardiomyopathy   VASECTOMY  1989   Family History:  Family History  Problem Relation Age of Onset   Colon cancer Mother    Hypertension Father    Seizures Father    Stroke Father    Dementia Paternal Grandmother    Heart attack Paternal Grandfather    Family Psychiatric  History: See previous Social History:  Social History   Substance and Sexual Activity  Alcohol Use Not Currently   Alcohol/week: 2.0 standard drinks of alcohol   Types: 1  Glasses of wine, 1 Cans of beer per week     Social History   Substance and Sexual Activity  Drug Use Never    Social History   Socioeconomic History   Marital status: Married    Spouse name: Not on file   Number of children: Not on file   Years of education: Not on file   Highest education level: Not on file  Occupational History   Not on file  Tobacco Use   Smoking status: Never   Smokeless tobacco: Never  Vaping Use   Vaping Use: Never used  Substance and Sexual Activity   Alcohol use: Not Currently    Alcohol/week: 2.0 standard drinks of alcohol    Types: 1 Glasses of wine, 1 Cans of beer per week   Drug use: Never   Sexual activity: Not on file  Other Topics Concern   Not on file  Social History Narrative   He is married now for 4 years (remarried).  He has 1 child (presumably from previous marriage -75 years old).   He lives with his wife.  He is an avid Chief Executive Officer.   He is a retired Producer, television/film/video for [employer].  (He has a BA in American studies at [employer].)   Never smoked.  Drinks 5-7 alcoholic beverages a week usually beer or hard cider.  May be occasionally a mixed drink.   He is quite active exercise at least 4 days a week for 30 minutes at a time.       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  He enjoys walking on both treadmill and in the community.  He plays golf routinely.   Social Determinants of Health   Financial Resource Strain: Not on file  Food Insecurity: No Food Insecurity (04/04/2022)   Hunger Vital Sign    Worried About Running Out of Food in the Last Year: Never true    Ran Out of Food in the Last Year: Never true  Transportation Needs: No Transportation Needs (04/04/2022)   PRAPARE - Hydrologist (Medical): No    Lack of Transportation (Non-Medical): No  Physical Activity: Not on file  Stress: Not on  file  Social Connections: Not on file   Additional Social History:                         Sleep: Fair  Appetite:  Fair  Current Medications: Current Facility-Administered Medications  Medication Dose Route Frequency Provider Last Rate Last Admin   acetaminophen (TYLENOL) tablet 1,000 mg  1,000 mg Oral Q6H PRN Deloria Lair, NP       allopurinol (ZYLOPRIM) tablet 150 mg  150 mg Oral Daily Dixon, Rashaun M, NP   150 mg at 04/11/22 0919   alum & mag hydroxide-simeth (MAALOX/MYLANTA) 200-200-20 MG/5ML suspension 30 mL  30 mL Oral Q4H PRN Deloria Lair, NP       atorvastatin (LIPITOR) tablet 40 mg  40 mg Oral Daily Dixon, Rashaun M, NP   40 mg at 04/11/22 [id number]   docusate sodium (COLACE) capsule 100 mg  100 mg Oral BID Moet Mikulski, Madie Reno, MD       doxepin (SINEQUAN) capsule 75 mg  75 mg Oral QHS Parks Ranger, DO   75 mg at 04/10/22 2127   hydrochlorothiazide (HYDRODIURIL) tablet 25 mg  25 mg Oral Daily Parks Ranger, DO   25 mg at 04/11/22 [id number]   LORazepam (ATIVAN) tablet 0.5 mg  0.5 mg Oral Q4H PRN Parks Ranger, DO   0.5 mg at 04/10/22 1021   LORazepam (ATIVAN) tablet 0.5 mg  0.5 mg Oral BH-q8a4p Parks Ranger, DO   0.5 mg at 04/11/22 0919   losartan (COZAAR) tablet 100 mg  100 mg Oral Daily Parks Ranger, DO   100 mg at 04/11/22 [id number]   magnesium hydroxide (MILK OF MAGNESIA) suspension 30 mL  30 mL Oral Daily PRN Deloria Lair, NP   30 mL at 04/10/22 0839   metoprolol succinate (TOPROL-XL) 24 hr tablet 50 mg  50 mg Oral Daily Anette Riedel M, NP   50 mg at 04/11/22 [id number]   multivitamin (RENA-VIT) tablet 1 tablet  1 tablet Oral QHS Deloria Lair, NP   1 tablet at 04/10/22 2127   risperiDONE (RISPERDAL) tablet 1 mg  1 mg Oral BH-q8a4p Parks Ranger, DO   1 mg at 04/11/22 [id number]   rivaroxaban (XARELTO) tablet 20 mg  20 mg Oral Q supper Anette Riedel M, NP   20 mg at 04/10/22 1701   senna-docusate (Senokot-S) tablet 2  tablet  2 tablet Oral QPC breakfast Parks Ranger, DO   2 tablet at 04/11/22 0919   senna-docusate (Senokot-S) tablet 2 tablet  2 tablet Oral Daily PRN Keshaun Dubey, Madie Reno, MD       traZODone (DESYREL) tablet 50 mg  50 mg Oral QHS PRN Deloria Lair, NP       venlafaxine XR (EFFEXOR-XR) 24 hr capsule 300 mg  300 mg Oral QPC breakfast Parks Ranger, DO   300 mg at 04/11/22 [id number]    Lab Results: No results found for this or any previous visit (from the past 48 hour(s)).  Blood Alcohol level:  Lab Results  Component Value Date   ETH <10 [id number]    Metabolic Disorder Labs: Lab Results  Component Value Date   HGBA1C 4.8 04/08/2022   MPG 91.06 04/08/2022   No results found for: "PROLACTIN" Lab Results  Component Value Date   CHOL 90 04/08/2022   TRIG 83 04/08/2022   HDL 35 (L) 04/08/2022   CHOLHDL 2.6 04/08/2022   VLDL 17 04/08/2022   LDLCALC 38 04/08/2022   LDLCALC 87 02/01/2018    Physical Findings: AIMS:  , ,  ,  ,    CIWA:    COWS:     Musculoskeletal: Strength & Muscle Tone: within normal limits Gait & Station: normal Patient leans: N/A  Psychiatric Specialty Exam:  Presentation  General Appearance:  Appropriate for Environment  Eye Contact: Fair  Speech: Slurred  Speech Volume: Normal  Handedness: Right   Mood and Affect  Mood: Hopeless  Affect: Depressed; Flat   Thought Process  Thought Processes: Coherent  Descriptions of Associations:Intact  Orientation:Full (Time, Place and Person)  Thought Content:WDL  History of Schizophrenia/Schizoaffective  disorder:No data recorded Duration of Psychotic Symptoms:No data recorded Hallucinations:No data recorded Ideas of Reference:None  Suicidal Thoughts:No data recorded Homicidal Thoughts:No data recorded  Sensorium  Memory: Immediate Good; Remote Good; Recent Good  Judgment: Fair  Insight: Good   Executive Functions  Concentration: Good  Attention  Span: Good  Recall: Good  Fund of Knowledge: Good  Language: Good   Psychomotor Activity  Psychomotor Activity:No data recorded  Assets  Assets: Communication Skills; Financial Resources/Insurance; Housing   Sleep  Sleep:No data recorded   Physical Exam: Physical Exam Vitals and nursing note reviewed.  Constitutional:      Appearance: Normal appearance.  HENT:     Head: Normocephalic and atraumatic.     Mouth/Throat:     Pharynx: Oropharynx is clear.  Eyes:     Pupils: Pupils are equal, round, and reactive to light.  Cardiovascular:     Rate and Rhythm: Normal rate and regular rhythm.  Pulmonary:     Effort: Pulmonary effort is normal.     Breath sounds: Normal breath sounds.  Abdominal:     General: Abdomen is flat.     Palpations: Abdomen is soft.  Musculoskeletal:        General: Normal range of motion.  Skin:    General: Skin is warm and dry.  Neurological:     General: No focal deficit present.     Mental Status: He is alert. Mental status is at baseline.  Psychiatric:        Attention and Perception: Attention normal.        Mood and Affect: Mood normal.        Speech: Speech normal.        Behavior: Behavior normal.        Thought Content: Thought content normal.        Cognition and Memory: Cognition normal.    Review of Systems  Constitutional: Negative.   HENT: Negative.    Eyes: Negative.   Respiratory: Negative.    Cardiovascular: Negative.   Gastrointestinal:  Positive for constipation.  Musculoskeletal: Negative.   Skin: Negative.   Neurological: Negative.   Psychiatric/Behavioral: Negative.     Blood pressure (!) 141/94, pulse 74, temperature 98.6 F (37 C), temperature source Oral, resp. rate 16, height 5' 10"$  (1.778 m), weight 104.3 kg, SpO2 98 %. Body mass index is 33 kg/m.   Treatment Plan Summary: Medication management and Plan patient appears improved and stable.  Taking care of his ADLs fine.  Ambulates fine.  Gets  along with staff and peers fine.  Denies acute symptoms.  I added Colace on a standing basis and also added as needed Senokot although I see he is already getting that as a standing dose.  May need to just drink more water.  Alethia Berthold, MD 04/11/2022, 1:17 PM

## 2022-04-12 DIAGNOSIS — F3163 Bipolar disorder, current episode mixed, severe, without psychotic features: Secondary | ICD-10-CM | POA: Diagnosis not present

## 2022-04-12 LAB — CBC
HCT: 39 % (ref 39.0–52.0)
Hemoglobin: 13.9 g/dL (ref 13.0–17.0)
MCH: 30.3 pg (ref 26.0–34.0)
MCHC: 35.6 g/dL (ref 30.0–36.0)
MCV: 85.2 fL (ref 80.0–100.0)
Platelets: 101 10*3/uL — ABNORMAL LOW (ref 150–400)
RBC: 4.58 MIL/uL (ref 4.22–5.81)
RDW: 14.5 % (ref 11.5–15.5)
WBC: 6.2 10*3/uL (ref 4.0–10.5)
nRBC: 0.3 % — ABNORMAL HIGH (ref 0.0–0.2)

## 2022-04-12 NOTE — Group Note (Signed)
Date:  04/12/2022 Time:  3:02 PM  Group Topic/Focus:  Healthy Communication:   The focus of this group is to discuss communication, barriers to communication, as well as healthy ways to communicate with others. Identifying Needs:   The focus of this group is to help patients identify their personal needs that have been historically problematic and identify healthy behaviors to address their needs.  We went over Communicating needs; 5 love languages According to the love language quiz, [place] primary Love language is quality time.   Participation Level:  Active  Participation Quality:  Appropriate and Attentive  Affect:  Appropriate  Cognitive:  Alert and Appropriate  Insight: Appropriate and Good  Engagement in Group:  Engaged  Modes of Intervention:  Activity and Discussion  Additional Comments:    Ladona Mow 04/12/2022, 3:02 PM

## 2022-04-12 NOTE — Plan of Care (Signed)
  Problem: Education: Goal: Knowledge of General Education information will improve Description: Including pain rating scale, medication(s)/side effects and non-pharmacologic comfort measures Outcome: Progressing   Problem: Nutrition: Goal: Adequate nutrition will be maintained Outcome: Progressing   Problem: Coping: Goal: Level of anxiety will decrease Outcome: Progressing   Problem: Safety: Goal: Ability to remain free from injury will improve Outcome: Progressing   Problem: Education: Goal: Mental status will improve Outcome: Progressing

## 2022-04-12 NOTE — Progress Notes (Signed)
Patient with anxious, worried  affect.  Endorses feelings of anxiety and depression.  Denies SI/HI and AVH.  Compliant with scheduled medications.  15 min checks in place for safety.  Patient spends the majority of his time in the dayroom - often reading his DBT workbook.  Participates in groups. Appropriate interaction with peers.

## 2022-04-12 NOTE — Group Note (Signed)
Date:  04/12/2022 Time:  8:35 PM  Group Topic/Focus:  Dimensions of Wellness:   The focus of this group is to introduce the topic of wellness and discuss the role each dimension of wellness plays in total health.    Participation Level:  Active  Participation Quality:  Appropriate and Attentive  Affect:  Excited  Cognitive:  Appropriate  Insight: Appropriate  Engagement in Group:  Engaged  Modes of Intervention:  Discussion  Additional Comments:  Nutritional Wellness pg.62 in Adult Unit Workbook  Neville Route 04/12/2022, 8:35 PM

## 2022-04-12 NOTE — Progress Notes (Signed)
[facility] MD Progress Note  04/12/2022 1:02 PM Bruce Yoder  MRN:  [id number] Subjective: Follow-up patient with bipolar with recent depression.  Patient reports mood is feeling better.  Denies suicidal thoughts.  Denies psychotic symptoms.  No behavior problems.  Compliant with medicine.  CBC today normal.  Vital signs slightly elevated blood pressure but inconsistent Principal Problem: Bipolar 1 disorder, mixed, severe (HCC) Diagnosis: Principal Problem:   Bipolar 1 disorder, mixed, severe (HCC) Active Problems:   Cluster B personality disorder in adult [facility])  Total Time spent with patient: 30 minutes  Past Psychiatric History: Past history of a diagnosis of bipolar disorder  Past Medical History:  Past Medical History:  Diagnosis Date   Atrial fibrillation ([place])    a. diagnosed on 10/22/2017; b. successful DCCV on 12/14/2017; Paroxysmal Persistent   Basal cell carcinoma (BCC) of back    Dr. Elvera Lennox   CAD (coronary artery disease)    a. LHC 01/31/2018: OM2 55-60%, FFR 0.93. pLAD 25%   CKD (chronic kidney disease) stage 3, GFR 30-59 ml/min (HCC)    Unilateral kidney   Dilated cardiomyopathy -related to A. fib RVR; resolved 10/2017   Thought to be related to A. fib RVR: a) echo 11/03/2017 showed LVEF of 35-40% with severe LVH --> follow-up echo January 2020 showed resolution with EF 55 to 60%.  Normal filling pressures.  Read as mild as opposed to severe LVH   Diverticulosis of colon    With polyps (Dr. Amedeo Plenty March 2017, followed by Dr. Glennon Hamilton)   Erectile dysfunction    Essential hypertension 2018   Gout    OSA on CPAP    [facility]   Status post nephrectomy 1965   Thrombocytopenia, unspecified ([place])    Platelet clumping (pseudothrombocytopenia)    Past Surgical History:  Procedure Laterality Date   APPENDECTOMY     During childhood   ATRIAL FIBRILLATION ABLATION N/A 10/11/2020   Procedure: ATRIAL FIBRILLATION ABLATION;  Surgeon: Constance Haw,  MD;  Location: [place] CV LAB;  Service: Cardiovascular;  Laterality: N/A;   CARDIOVERSION N/A 12/14/2017   Procedure: CARDIOVERSION;  Surgeon: Sueanne Margarita, MD;  Location: [facility] ENDOSCOPY;  Service: Cardiovascular;  Laterality: N/A;   CARDIOVERSION N/A 07/04/2020   Procedure: CARDIOVERSION;  Surgeon: Sueanne Margarita, MD;  Location: [place];  Service: Cardiovascular;  Laterality: N/A;   CYST EXCISION     face   INTRAVASCULAR PRESSURE WIRE/FFR STUDY N/A 01/31/2018   Procedure: INTRAVASCULAR PRESSURE WIRE/FFR STUDY;  Surgeon: Leonie Man, MD;  Location: [place] CV LAB;  Service: Cardiovascular;  Laterality: N/A;   LEFT HEART CATH AND CORONARY ANGIOGRAPHY N/A 01/31/2018   Procedure: LEFT HEART CATH AND CORONARY ANGIOGRAPHY;  Surgeon: Leonie Man, MD;  Location: [place] CV LAB;  Service: Cardiovascular: Single-vessel disease with roughly 60% lesion in major OM branch.  FFR negative.   NEPHRECTOMY Right 1965   TRANSTHORACIC ECHOCARDIOGRAM  10/2017    EF 35 -40% with severe LVH.  Diffuse global hypokinesis.  Mild aortic and mitral regurgitation.  Mild left and right atrial dilation.   TRANSTHORACIC ECHOCARDIOGRAM  02/2018   EF 55-60%, normal filling pressure. Mod LA dilation.  Mild to moderate TR. -> resolution of cardiomyopathy   VASECTOMY  1989   Family History:  Family History  Problem Relation Age of Onset   Colon cancer Mother    Hypertension Father    Seizures Father    Stroke Father    Dementia Paternal Grandmother  Heart attack Paternal Grandfather    Family Psychiatric  History: See previous Social History:  Social History   Substance and Sexual Activity  Alcohol Use Not Currently   Alcohol/week: 2.0 standard drinks of alcohol   Types: 1 Glasses of wine, 1 Cans of beer per week     Social History   Substance and Sexual Activity  Drug Use Never    Social History   Socioeconomic History   Marital status: Married    Spouse name: Not on file    Number of children: Not on file   Years of education: Not on file   Highest education level: Not on file  Occupational History   Not on file  Tobacco Use   Smoking status: Never   Smokeless tobacco: Never  Vaping Use   Vaping Use: Never used  Substance and Sexual Activity   Alcohol use: Not Currently    Alcohol/week: 2.0 standard drinks of alcohol    Types: 1 Glasses of wine, 1 Cans of beer per week   Drug use: Never   Sexual activity: Not on file  Other Topics Concern   Not on file  Social History Narrative   He is married now for 4 years (remarried).  He has 1 child (presumably from previous marriage -75 years old).   He lives with his wife.  He is an avid Chief Executive Officer.   He is a retired Producer, television/film/video for [employer].  (He has a BA in American studies at [employer].)   Never smoked.  Drinks 5-7 alcoholic beverages a week usually beer or hard cider.  May be occasionally a mixed drink.   He is quite active exercise at least 4 days a week for 30 minutes at a time.       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  He enjoys walking on both treadmill and in the community.  He plays golf routinely.   Social Determinants of Health   Financial Resource Strain: Not on file  Food Insecurity: No Food Insecurity (04/04/2022)   Hunger Vital Sign    Worried About Running Out of Food in the Last Year: Never true    Ran Out of Food in the Last Year: Never true  Transportation Needs: No Transportation Needs (04/04/2022)   PRAPARE - Hydrologist (Medical): No    Lack of Transportation (Non-Medical): No  Physical Activity: Not on file  Stress: Not on file  Social Connections: Not on file   Additional Social History:                         Sleep: Fair  Appetite:  Fair  Current Medications: Current Facility-Administered Medications  Medication  Dose Route Frequency Provider Last Rate Last Admin   acetaminophen (TYLENOL) tablet 1,000 mg  1,000 mg Oral Q6H PRN Deloria Lair, NP       allopurinol (ZYLOPRIM) tablet 150 mg  150 mg Oral Daily Dixon, Rashaun M, NP   150 mg at 04/12/22 0920   alum & mag hydroxide-simeth (MAALOX/MYLANTA) 200-200-20 MG/5ML suspension 30 mL  30 mL Oral Q4H PRN Deloria Lair, NP       atorvastatin (LIPITOR) tablet 40 mg  40 mg Oral Daily Dixon, Rashaun M, NP   40 mg at 04/12/22 0920   docusate sodium (COLACE) capsule 100 mg  100 mg Oral  BID Yanis Juma, Madie Reno, MD   100 mg at 04/12/22 [id number]   doxepin (SINEQUAN) capsule 75 mg  75 mg Oral QHS Parks Ranger, DO   75 mg at 04/11/22 2106   hydrochlorothiazide (HYDRODIURIL) tablet 25 mg  25 mg Oral Daily Parks Ranger, DO   25 mg at 04/12/22 0920   LORazepam (ATIVAN) tablet 0.5 mg  0.5 mg Oral Q4H PRN Parks Ranger, DO   0.5 mg at 04/10/22 1021   LORazepam (ATIVAN) tablet 0.5 mg  0.5 mg Oral BH-q8a4p Parks Ranger, DO   0.5 mg at 04/12/22 [id number]   losartan (COZAAR) tablet 100 mg  100 mg Oral Daily Parks Ranger, DO   100 mg at 04/12/22 [id number]   magnesium hydroxide (MILK OF MAGNESIA) suspension 30 mL  30 mL Oral Daily PRN Deloria Lair, NP   30 mL at 04/10/22 0839   metoprolol succinate (TOPROL-XL) 24 hr tablet 50 mg  50 mg Oral Daily Deloria Lair, NP   50 mg at 04/12/22 [id number]   multivitamin (RENA-VIT) tablet 1 tablet  1 tablet Oral QHS Deloria Lair, NP   1 tablet at 04/11/22 2106   risperiDONE (RISPERDAL) tablet 1 mg  1 mg Oral BH-q8a4p Parks Ranger, DO   1 mg at 04/12/22 [id number]   rivaroxaban (XARELTO) tablet 20 mg  20 mg Oral Q supper Anette Riedel M, NP   20 mg at 04/11/22 1637   senna-docusate (Senokot-S) tablet 2 tablet  2 tablet Oral QPC breakfast Parks Ranger, DO   2 tablet at 04/12/22 0920   senna-docusate (Senokot-S) tablet 2 tablet  2 tablet Oral Daily PRN Kelan Pritt, Madie Reno, MD        traZODone (DESYREL) tablet 50 mg  50 mg Oral QHS PRN Deloria Lair, NP       venlafaxine XR (EFFEXOR-XR) 24 hr capsule 300 mg  300 mg Oral QPC breakfast Parks Ranger, DO   300 mg at 04/12/22 0920    Lab Results:  Results for orders placed or performed during the hospital encounter of 04/04/22 (from the past 48 hour(s))  CBC     Status: Abnormal   Collection Time: 04/12/22  6:31 AM  Result Value Ref Range   WBC 6.2 4.0 - 10.5 K/uL   RBC 4.58 4.22 - 5.81 MIL/uL   Hemoglobin 13.9 13.0 - 17.0 g/dL   HCT 39.0 39.0 - 52.0 %   MCV 85.2 80.0 - 100.0 fL   MCH 30.3 26.0 - 34.0 pg   MCHC 35.6 30.0 - 36.0 g/dL   RDW 14.5 11.5 - 15.5 %   Platelets 101 (L) 150 - 400 K/uL    Comment: Immature Platelet Fraction may be clinically indicated, consider ordering this additional test [id number]    nRBC 0.3 (H) 0.0 - 0.2 %    Comment: Performed at [facility], [address] [place], [place] [address]    Blood Alcohol level:  Lab Results  Component Value Date   [facility] <10 [id number]    Metabolic Disorder Labs: Lab Results  Component Value Date   HGBA1C 4.8 04/08/2022   MPG 91.06 04/08/2022   No results found for: "PROLACTIN" Lab Results  Component Value Date   CHOL 90 04/08/2022   TRIG 83 04/08/2022   HDL 35 (L) 04/08/2022   CHOLHDL 2.6 04/08/2022   VLDL 17 04/08/2022   LDLCALC 38 04/08/2022   LDLCALC 87 02/01/2018    Physical Findings: AIMS:  , ,  ,  ,  CIWA:    COWS:     Musculoskeletal: Strength & Muscle Tone: within normal limits Gait & Station: normal Patient leans: N/A  Psychiatric Specialty Exam:  Presentation  General Appearance:  Appropriate for Environment  Eye Contact: Fair  Speech: Slurred  Speech Volume: Normal  Handedness: Right   Mood and Affect  Mood: Hopeless  Affect: Depressed; Flat   Thought Process  Thought Processes: Coherent  Descriptions of Associations:Intact  Orientation:Full (Time, Place and  Person)  Thought Content:WDL  History of Schizophrenia/Schizoaffective disorder:No data recorded Duration of Psychotic Symptoms:No data recorded Hallucinations:No data recorded Ideas of Reference:None  Suicidal Thoughts:No data recorded Homicidal Thoughts:No data recorded  Sensorium  Memory: Immediate Good; Remote Good; Recent Good  Judgment: Fair  Insight: Good   Executive Functions  Concentration: Good  Attention Span: Good  Recall: Good  Fund of Knowledge: Good  Language: Good   Psychomotor Activity  Psychomotor Activity:No data recorded  Assets  Assets: Communication Skills; Financial Resources/Insurance; Housing   Sleep  Sleep:No data recorded   Physical Exam: Physical Exam Vitals and nursing note reviewed.  Constitutional:      Appearance: Normal appearance.  HENT:     Head: Normocephalic and atraumatic.     Mouth/Throat:     Pharynx: Oropharynx is clear.  Eyes:     Pupils: Pupils are equal, round, and reactive to light.  Cardiovascular:     Rate and Rhythm: Normal rate and regular rhythm.  Pulmonary:     Effort: Pulmonary effort is normal.     Breath sounds: Normal breath sounds.  Abdominal:     General: Abdomen is flat.     Palpations: Abdomen is soft.  Musculoskeletal:        General: Normal range of motion.  Skin:    General: Skin is warm and dry.  Neurological:     General: No focal deficit present.     Mental Status: He is alert. Mental status is at baseline.  Psychiatric:        Mood and Affect: Mood normal.        Thought Content: Thought content normal.    Review of Systems  Constitutional: Negative.   HENT: Negative.    Eyes: Negative.   Respiratory: Negative.    Cardiovascular: Negative.   Gastrointestinal: Negative.   Musculoskeletal: Negative.   Skin: Negative.   Neurological: Negative.   Psychiatric/Behavioral: Negative.     Blood pressure (!) 121/98, pulse 66, temperature (!) 97.5 F (36.4 C),  temperature source Oral, resp. rate 20, height 5' 10"$  (1.778 m), weight 104.3 kg, SpO2 99 %. Body mass index is 33 kg/m.   Treatment Plan Summary: Medication management and Plan patient generally seems improved.  Stable.  Good self-care denies suicidal thoughts no evidence psychosis or agitation.  Tolerating medicine.  Consider possible discharge as early as tomorrow.  Alethia Berthold, MD 04/12/2022, 1:02 PM

## 2022-04-13 DIAGNOSIS — F3163 Bipolar disorder, current episode mixed, severe, without psychotic features: Secondary | ICD-10-CM | POA: Diagnosis not present

## 2022-04-13 NOTE — Group Note (Signed)
Date:  04/13/2022 Time:  4:53 PM  Group Topic/Focus:  Making Healthy Choices:   The focus of this group is to help patients identify negative/unhealthy choices they were using prior to admission and identify positive/healthier coping strategies to replace them upon discharge. Overcoming Stress:   The focus of this group is to define stress and help patients assess their triggers.    Participation Level:  Active  Participation Quality:  Appropriate  Affect:  Appropriate  Cognitive:  Alert, Appropriate, and Oriented  Insight: Appropriate  Engagement in Group:  Engaged  Modes of Intervention:  Activity and Socialization  Additional Comments:  Mr. Romrell participated in gentle stretching exercises.  [name] 04/13/2022, 4:53 PM

## 2022-04-13 NOTE — Progress Notes (Signed)
   04/13/22 0711  Psych Admission Type (Psych Patients Only)  Admission Status Involuntary  Psychosocial Assessment  Patient Complaints Anxiety  Eye Contact Fair  Facial Expression Anxious  Affect Anxious  Speech Logical/coherent  Interaction Assertive  Motor Activity Other (Comment)  Appearance/Hygiene Unremarkable  Behavior Characteristics Cooperative  Mood Anxious  Thought Process  Coherency WDL  Content WDL  Delusions None reported or observed  Perception WDL  Hallucination None reported or observed  Judgment WDL  Confusion None  Danger to Self  Current suicidal ideation? Denies  Danger to Others  Danger to Others None reported or observed

## 2022-04-13 NOTE — Group Note (Signed)
Date:  04/13/2022 Time:  11:08 AM Wellness toolbox  Participation Level:  None  Participation Quality:   None  Affect:   None  Cognitive:   None  Insight: None  Engagement in Group:   None  Modes of Intervention:   None  Additional Comments:  This entry was made in error, and I do not know how to delete it from the record.  Entry to be used was made a few minutes before this one.  Thank you.,  [name] 04/13/2022, 11:08 AM

## 2022-04-13 NOTE — BH Assessment (Incomplete)
1950 Received patient sitting in the day room reading a book. He is alert and oriented  x 4 and is denying SI/HI, A/H. He does endorse Depression (3), and anxiety (4).   2045 Patient remained in the day room to participate in group therapy. He did actively participate by answering questions and given his personal thoughts about differ things that was being asked of the group leader. After group he continued to read his book. Will continue to monitor patient for safety.  2330 Patient remain medication compliant and is aware of the medication that he is taking and the usage. He reports that he will be discharged on tomorrow. Patient continues to have a blunt affect but it is not as it was on yesterday. He appear to be happy to go home.  0230 patient has rested quietly in bed but has gotten up to used the restroom. Will continue to monitor for safety.  0530 Patient continues to rest quietly in bed. He has remained safe thus far.

## 2022-04-13 NOTE — Group Note (Signed)
Date:  04/13/2022 Time:  10:58 AM  Group Topic/Focus:  Coping With Mental Health Crisis:   The purpose of this group is to help patients identify strategies for coping with mental health crisis.  Group discusses possible causes of crisis and ways to manage them effectively. Developing a Wellness Toolbox:   The focus of this group is to help patients develop a "wellness toolbox" with skills and strategies to promote recovery upon discharge. Identifying Needs:   The focus of this group is to help patients identify their personal needs that have been historically problematic and identify healthy behaviors to address their needs. Managing Feelings:   The focus of this group is to identify what feelings patients have difficulty handling and develop a plan to handle them in a healthier way upon discharge. Overcoming Stress:   The focus of this group is to define stress and help patients assess their triggers. Self Care:   The focus of this group is to help patients understand the importance of self-care in order to improve or restore emotional, physical, spiritual, interpersonal, and financial health. Wellness Toolbox:   The focus of this group is to discuss various aspects of wellness, balancing those aspects and exploring ways to increase the ability to experience wellness.  Patients will create a wellness toolbox for use upon discharge.    Participation Level:  Active  Participation Quality:  Appropriate  Affect:  Appropriate  Cognitive:  Alert and Appropriate  Insight: Appropriate and Good  Engagement in Group:  Engaged  Modes of Intervention:  Activity, Discussion, Education, Exploration, and Socialization  Additional Comments:  Bruce Yoder participated and was able to teach back the skills we discussed in group.  [name] 04/13/2022, 10:58 AM

## 2022-04-13 NOTE — Group Note (Signed)
Date:  04/13/2022 Time:  8:33 PM  Group Topic/Focus:  Healthy Communication:   The focus of this group is to discuss communication, barriers to communication, as well as healthy ways to communicate with others.    Participation Level:  Active  Participation Quality:  Attentive  Affect:  Excited  Cognitive:  Appropriate  Insight: Good  Engagement in Group:  Engaged  Modes of Intervention:  Discussion  Additional Comments:  Feeling statements pg.26 Adult Unit Workbook  Neville Route 04/13/2022, 8:33 PM

## 2022-04-13 NOTE — Progress Notes (Cosign Needed Addendum)
[facility] MD Progress Note  04/13/2022 10:23 AM Bruce Yoder  MRN:  [id number]  Subjective: Follow-up patient with bipolar with recent depression. He reports his mood is improving with no suicidal ideations.  Sleep and appetite are "good".  Anxiety is mild to moderate, no panic attacks.  He denies side effects from his medications and discharge is planned for Wednesday when his daughter arrives from out of town as she wants to be there for him at discharge.  Discussed someone setting up medication boxes for him and keeping medication bottles to prevent future overdoses, he was receptive to this.  Principal Problem: Bipolar 1 disorder, mixed, severe (HCC) Diagnosis: Principal Problem:   Bipolar 1 disorder, mixed, severe (HCC) Active Problems:   Cluster B personality disorder in adult [facility])  Total Time spent with patient: 30 minutes  Past Psychiatric History: Past history of a diagnosis of bipolar disorder  Past Medical History:  Past Medical History:  Diagnosis Date   Atrial fibrillation ([place])    a. diagnosed on 10/22/2017; b. successful DCCV on 12/14/2017; Paroxysmal Persistent   Basal cell carcinoma (BCC) of back    Dr. Elvera Lennox   CAD (coronary artery disease)    a. LHC 01/31/2018: OM2 55-60%, FFR 0.93. pLAD 25%   CKD (chronic kidney disease) stage 3, GFR 30-59 ml/min (HCC)    Unilateral kidney   Dilated cardiomyopathy -related to A. fib RVR; resolved 10/2017   Thought to be related to A. fib RVR: a) echo 11/03/2017 showed LVEF of 35-40% with severe LVH --> follow-up echo January 2020 showed resolution with EF 55 to 60%.  Normal filling pressures.  Read as mild as opposed to severe LVH   Diverticulosis of colon    With polyps (Dr. Amedeo Plenty March 2017, followed by Dr. Glennon Hamilton)   Erectile dysfunction    Essential hypertension 2018   Gout    OSA on CPAP    [facility]   Status post nephrectomy 1965   Thrombocytopenia, unspecified ([place])    Platelet clumping  (pseudothrombocytopenia)    Past Surgical History:  Procedure Laterality Date   APPENDECTOMY     During childhood   ATRIAL FIBRILLATION ABLATION N/A 10/11/2020   Procedure: ATRIAL FIBRILLATION ABLATION;  Surgeon: Constance Haw, MD;  Location: [place] CV LAB;  Service: Cardiovascular;  Laterality: N/A;   CARDIOVERSION N/A 12/14/2017   Procedure: CARDIOVERSION;  Surgeon: Sueanne Margarita, MD;  Location: [facility] ENDOSCOPY;  Service: Cardiovascular;  Laterality: N/A;   CARDIOVERSION N/A 07/04/2020   Procedure: CARDIOVERSION;  Surgeon: Sueanne Margarita, MD;  Location: [place];  Service: Cardiovascular;  Laterality: N/A;   CYST EXCISION     face   INTRAVASCULAR PRESSURE WIRE/FFR STUDY N/A 01/31/2018   Procedure: INTRAVASCULAR PRESSURE WIRE/FFR STUDY;  Surgeon: Leonie Man, MD;  Location: [place] [facility];  Service: Cardiovascular;  Laterality: N/A;   LEFT HEART CATH AND CORONARY ANGIOGRAPHY N/A 01/31/2018   Procedure: LEFT HEART CATH AND CORONARY ANGIOGRAPHY;  Surgeon: Leonie Man, MD;  Location: [place] CV LAB;  Service: Cardiovascular: Single-vessel disease with roughly 60% lesion in major OM branch.  FFR negative.   NEPHRECTOMY Right 1965   TRANSTHORACIC ECHOCARDIOGRAM  10/2017    EF 35 -40% with severe LVH.  Diffuse global hypokinesis.  Mild aortic and mitral regurgitation.  Mild left and right atrial dilation.   TRANSTHORACIC ECHOCARDIOGRAM  02/2018   EF 55-60%, normal filling pressure. Mod LA dilation.  Mild to moderate TR. -> resolution of  cardiomyopathy   VASECTOMY  1989   Family History:  Family History  Problem Relation Age of Onset   Colon cancer Mother    Hypertension Father    Seizures Father    Stroke Father    Dementia Paternal Grandmother    Heart attack Paternal Grandfather    Family Psychiatric  History: See previous Social History:  Social History   Substance and Sexual Activity  Alcohol Use Not Currently   Alcohol/week: 2.0 standard drinks  of alcohol   Types: 1 Glasses of wine, 1 Cans of beer per week     Social History   Substance and Sexual Activity  Drug Use Never    Social History   Socioeconomic History   Marital status: Married    Spouse name: Not on file   Number of children: Not on file   Years of education: Not on file   Highest education level: Not on file  Occupational History   Not on file  Tobacco Use   Smoking status: Never   Smokeless tobacco: Never  Vaping Use   Vaping Use: Never used  Substance and Sexual Activity   Alcohol use: Not Currently    Alcohol/week: 2.0 standard drinks of alcohol    Types: 1 Glasses of wine, 1 Cans of beer per week   Drug use: Never   Sexual activity: Not on file  Other Topics Concern   Not on file  Social History Narrative   He is married now for 4 years (remarried).  He has 1 child (presumably from previous marriage -75 years old).   He lives with his wife.  He is an avid Chief Executive Officer.   He is a retired Producer, television/film/video for [employer].  (He has a BA in American studies at [employer].)   Never smoked.  Drinks 5-7 alcoholic beverages a week usually beer or hard cider.  May be occasionally a mixed drink.   He is quite active exercise at least 4 days a week for 30 minutes at a time.       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  He enjoys walking on both treadmill and in the community.  He plays golf routinely.   Social Determinants of Health   Financial Resource Strain: Not on file  Food Insecurity: No Food Insecurity (04/04/2022)   Hunger Vital Sign    Worried About Running Out of Food in the Last Year: Never true    Ran Out of Food in the Last Year: Never true  Transportation Needs: No Transportation Needs (04/04/2022)   PRAPARE - Hydrologist (Medical): No    Lack of Transportation (Non-Medical): No  Physical Activity: Not on  file  Stress: Not on file  Social Connections: Not on file   Additional Social History:      Sleep: Good  Appetite:  Good  Current Medications: Current Facility-Administered Medications  Medication Dose Route Frequency Provider Last Rate Last Admin   acetaminophen (TYLENOL) tablet 1,000 mg  1,000 mg Oral Q6H PRN Deloria Lair, NP       allopurinol (ZYLOPRIM) tablet 150 mg  150 mg Oral Daily Dixon, Rashaun M, NP   150 mg at 04/13/22 0914   alum & mag hydroxide-simeth (MAALOX/MYLANTA) 200-200-20 MG/5ML suspension 30 mL  30 mL Oral Q4H PRN Deloria Lair, NP       atorvastatin (LIPITOR) tablet 40  mg  40 mg Oral Daily Deloria Lair, NP   40 mg at 04/13/22 [id number]   docusate sodium (COLACE) capsule 100 mg  100 mg Oral BID Clapacs, Madie Reno, MD   100 mg at 04/13/22 0909   doxepin (SINEQUAN) capsule 75 mg  75 mg Oral QHS Parks Ranger, DO   75 mg at 04/12/22 2126   hydrochlorothiazide (HYDRODIURIL) tablet 25 mg  25 mg Oral Daily Parks Ranger, DO   25 mg at 04/13/22 [id number]   LORazepam (ATIVAN) tablet 0.5 mg  0.5 mg Oral Q4H PRN Parks Ranger, DO   0.5 mg at 04/10/22 1021   LORazepam (ATIVAN) tablet 0.5 mg  0.5 mg Oral BH-q8a4p Parks Ranger, DO   0.5 mg at 04/13/22 [id number]   losartan (COZAAR) tablet 100 mg  100 mg Oral Daily Parks Ranger, DO   100 mg at 04/13/22 0908   magnesium hydroxide (MILK OF MAGNESIA) suspension 30 mL  30 mL Oral Daily PRN Deloria Lair, NP   30 mL at 04/10/22 0839   metoprolol succinate (TOPROL-XL) 24 hr tablet 50 mg  50 mg Oral Daily Deloria Lair, NP   50 mg at 04/13/22 [id number]   multivitamin (RENA-VIT) tablet 1 tablet  1 tablet Oral QHS Deloria Lair, NP   1 tablet at 04/12/22 2126   risperiDONE (RISPERDAL) tablet 1 mg  1 mg Oral BH-q8a4p Parks Ranger, DO   1 mg at 04/13/22 [id number]   rivaroxaban (XARELTO) tablet 20 mg  20 mg Oral Q supper Anette Riedel M, NP   20 mg at 04/12/22 1714   senna-docusate  (Senokot-S) tablet 2 tablet  2 tablet Oral QPC breakfast Parks Ranger, DO   2 tablet at 04/13/22 [id number]   senna-docusate (Senokot-S) tablet 2 tablet  2 tablet Oral Daily PRN Clapacs, Madie Reno, MD       traZODone (DESYREL) tablet 50 mg  50 mg Oral QHS PRN Deloria Lair, NP       venlafaxine XR (EFFEXOR-XR) 24 hr capsule 300 mg  300 mg Oral QPC breakfast Parks Ranger, DO   300 mg at 04/13/22 0908    Lab Results:  Results for orders placed or performed during the hospital encounter of 04/04/22 (from the past 48 hour(s))  CBC     Status: Abnormal   Collection Time: 04/12/22  6:31 AM  Result Value Ref Range   WBC 6.2 4.0 - 10.5 K/uL   RBC 4.58 4.22 - 5.81 MIL/uL   Hemoglobin 13.9 13.0 - 17.0 g/dL   HCT 39.0 39.0 - 52.0 %   MCV 85.2 80.0 - 100.0 fL   MCH 30.3 26.0 - 34.0 pg   MCHC 35.6 30.0 - 36.0 g/dL   RDW 14.5 11.5 - 15.5 %   Platelets 101 (L) 150 - 400 K/uL    Comment: Immature Platelet Fraction may be clinically indicated, consider ordering this additional test [id number]    nRBC 0.3 (H) 0.0 - 0.2 %    Comment: Performed at [facility], [address] [place], [place] [address]    Blood Alcohol level:  Lab Results  Component Value Date   [facility] <10 [id number]    Metabolic Disorder Labs: Lab Results  Component Value Date   HGBA1C 4.8 04/08/2022   MPG 91.06 04/08/2022   No results found for: "PROLACTIN" Lab Results  Component Value Date   CHOL 90 04/08/2022   TRIG 83 04/08/2022   HDL  35 (L) 04/08/2022   CHOLHDL 2.6 04/08/2022   VLDL 17 04/08/2022   LDLCALC 38 04/08/2022   LDLCALC 87 02/01/2018      Musculoskeletal: Strength & Muscle Tone: within normal limits Gait & Station: normal Patient leans: N/A  Psychiatric Specialty Exam: Physical Exam Vitals and nursing note reviewed.  Constitutional:      Appearance: Normal appearance.  HENT:     Head: Normocephalic and atraumatic.     Nose: Nose normal.  Pulmonary:     Effort:  Pulmonary effort is normal.  Musculoskeletal:        General: Normal range of motion.     Cervical back: Normal range of motion.  Neurological:     General: No focal deficit present.     Mental Status: He is alert and oriented to person, place, and time.  Psychiatric:        Attention and Perception: Attention and perception normal.        Mood and Affect: Mood is anxious and depressed.        Speech: Speech normal.        Behavior: Behavior normal. Behavior is cooperative.        Thought Content: Thought content normal.        Cognition and Memory: Cognition and memory normal.        Judgment: Judgment normal.     Review of Systems  Constitutional: Negative.   HENT: Negative.    Eyes: Negative.   Respiratory: Negative.    Cardiovascular: Negative.   Gastrointestinal: Negative.   Musculoskeletal: Negative.   Skin: Negative.   Neurological: Negative.   Psychiatric/Behavioral:  Positive for depression. The patient is nervous/anxious.   All other systems reviewed and are negative.   Blood pressure 132/77, pulse 65, temperature 98.3 F (36.8 C), resp. rate 18, height 5' 10"$  (1.778 m), weight 104.3 kg, SpO2 100 %.Body mass index is 33 kg/m.  General Appearance: Casual  Eye Contact:  Good  Speech:  Normal Rate  Volume:  Normal  Mood:  Anxious and Depressed  Affect:  Congruent  Thought Process:  Coherent  Orientation:  Full (Time, Place, and Person)  Thought Content:  Rumination  Suicidal Thoughts:  No  Homicidal Thoughts:  No  Memory:  Immediate;   Good Recent;   Good Remote;   Good  Judgement:  Fair  Insight:  Fair  Psychomotor Activity:  Normal  Concentration:  Concentration: Good and Attention Span: Good  Recall:  Good  Fund of Knowledge:  Good  Language:  Good  Akathisia:  No  Handed:  Right  AIMS (if indicated):     Assets:  Housing Leisure Time Resilience Social Support  ADL's:  Intact  Cognition:  WNL  Sleep:         Physical Exam: Physical  Exam Vitals and nursing note reviewed.  Constitutional:      Appearance: Normal appearance.  HENT:     Head: Normocephalic and atraumatic.     Nose: Nose normal.  Pulmonary:     Effort: Pulmonary effort is normal.  Musculoskeletal:        General: Normal range of motion.     Cervical back: Normal range of motion.  Neurological:     General: No focal deficit present.     Mental Status: He is alert and oriented to person, place, and time.  Psychiatric:        Attention and Perception: Attention and perception normal.  Mood and Affect: Mood is anxious and depressed.        Speech: Speech normal.        Behavior: Behavior normal. Behavior is cooperative.        Thought Content: Thought content normal.        Cognition and Memory: Cognition and memory normal.        Judgment: Judgment normal.    Review of Systems  Constitutional: Negative.   HENT: Negative.    Eyes: Negative.   Respiratory: Negative.    Cardiovascular: Negative.   Gastrointestinal: Negative.   Musculoskeletal: Negative.   Skin: Negative.   Neurological: Negative.   Psychiatric/Behavioral:  Positive for depression. The patient is nervous/anxious.   All other systems reviewed and are negative.  Blood pressure 132/77, pulse 65, temperature 98.3 F (36.8 C), resp. rate 18, height 5' 10"$  (1.778 m), weight 104.3 kg, SpO2 100 %. Body mass index is 33 kg/m.   Treatment Plan Summary: Bipolar affective disorder, mixed, severe: Effexor 300 mg daily Risperdal 1 mg BID  Anxiety: Ativan 0.5 mg BID Discontinued the PRN Ativan  Insomnia: Trazodone 50 mg at bedtime PRN Doxepin 75 mg at bedtime   Waylan Boga, NP 04/13/2022, 10:23 AM

## 2022-04-13 NOTE — Plan of Care (Signed)
  Problem: Education: Goal: Knowledge of General Education information will improve Description: Including pain rating scale, medication(s)/side effects and non-pharmacologic comfort measures Outcome: Progressing   Problem: Health Behavior/Discharge Planning: Goal: Ability to manage health-related needs will improve Outcome: Progressing   Problem: Clinical Measurements: Goal: Ability to maintain clinical measurements within normal limits will improve Outcome: Progressing Goal: Will remain free from infection Outcome: Progressing Goal: Diagnostic test results will improve Outcome: Progressing Goal: Respiratory complications will improve Outcome: Progressing Goal: Cardiovascular complication will be avoided Outcome: Progressing   Problem: Activity: Goal: Risk for activity intolerance will decrease Outcome: Progressing   Problem: Nutrition: Goal: Adequate nutrition will be maintained Outcome: Progressing   Problem: Coping: Goal: Level of anxiety will decrease Outcome: Progressing   Problem: Elimination: Goal: Will not experience complications related to bowel motility Outcome: Progressing Goal: Will not experience complications related to urinary retention Outcome: Progressing   Problem: Medication: Goal: Compliance with prescribed medication regimen will improve Outcome: Progressing   Problem: Self-Concept: Goal: Ability to disclose and discuss suicidal ideas will improve Outcome: Progressing Goal: Will verbalize positive feelings about self Outcome: Progressing   Problem: Health Behavior/Discharge Planning: Goal: Identification of resources available to assist in meeting health care needs will improve Outcome: Progressing   Problem: Coping: Goal: Coping ability will improve Outcome: Progressing   Problem: Education: Goal: Ability to make informed decisions regarding treatment will improve Outcome: Progressing

## 2022-04-13 NOTE — BH Assessment (Signed)
1915 Received patient sitting the day room reading a book. He appear to be relaxed and enjoying his book. Will continue to monitor patient for safety.  2030 Patient remains in the day room. He eat his snack and when to his room. He currently denies SI/HI, A/V hallucinations. He does endorse depression about a 3 and anxiety of a 3. Patient denies pain at this time. Will continue to monitor patient for safety.  2210 Patient continues to medication compliant. He states that he is reading a lot to keep his mind busy. Patient has the appearance of less anxious as on admission. Will continue to monitor  patient for safety.  0200 Patient continue to rest quietly in bed, has been up several times to used the  rest room.   0500 Patient has rest in his bed for the most of the shift. He has not voiced any issues of concern. Will continue to monitor patient for safety.

## 2022-04-13 NOTE — Group Note (Signed)
Recreation Therapy Group Note   Group Topic:Coping Skills  Group Date: 04/13/2022 Start Time: 1400 End Time: 1455 Facilitators: Vilma Prader, LRT, CTRS Location:  Dayroom  Group Description: Journaling. Patient was given a packet with 99 different journaling prompts on it. Pt was encouraged to pick 3-5 different prompts to complete on lined paper provided. LRT and pt shared which prompts they chose and why. LRT and pt discussed the benefits of journaling and how it can be implemented post discharge. LRT played soft background music throughout group.  Affect/Mood: Appropriate   Participation Level: Active and Engaged   Participation Quality: Independent   Behavior: Appropriate   Speech/Thought Process: Coherent and Directed   Insight: Good   Judgement: Good   Modes of Intervention: Activity and Education   Patient Response to Interventions:  Attentive, Engaged, Interested , and Receptive   Education Outcome:  Acknowledges education   Clinical Observations/Individualized Feedback: Alastor was active in their participation of session activities and group discussion. Pt identified "schedule of what the perfect day looks like, how he would spend $500, 3 examples of ways people have gone out of their way to help me, my favorite teacher, and my worst habit". Pt went into detail that his worst habit is thinking the same thing over and over again and that it is usually a negative thought. Pt shared that some of the prompts were more difficult than others but that he found it to be helpful and something that he can do at home as a coping skill.   Plan: Continue to engage patient in RT group sessions 2-3x/week.   Vilma Prader, LRT, CTRS 04/13/2022 3:02 PM

## 2022-04-13 NOTE — Progress Notes (Signed)
Patient is alert and oriented times 4. Mood and affect appropriate. Patient denies pain. He denies SI, HI, and AVH. He expresses feelings of anxiety and depression at this time. States he slept good last night. Morning meds given whole by mouth W/O difficulty. Ate breakfast in day room- appetite good. Patient remains on unit with Q15 minute checks in place.

## 2022-04-14 DIAGNOSIS — F3163 Bipolar disorder, current episode mixed, severe, without psychotic features: Secondary | ICD-10-CM | POA: Diagnosis not present

## 2022-04-14 NOTE — Progress Notes (Signed)
[facility] MD Progress Note  04/14/2022 11:01 AM BOOTH JETT  MRN:  [id number] Subjective: Follow-up 77 year old gentleman with bipolar disorder.  Patient seen and chart reviewed.  Nursing reports no concerns.  Patient was neatly dressed sitting up in the day room reading and interacting appropriately.  He tells me his mood continues to feel good.  Denies any manic symptoms.  Denies suicidal thoughts denies severe anxiety.  Patient states he feels comfortable with the tentative plan for discharge tomorrow.  He wanted me to go over his lab tests that were done in the last day which I did.  All of them are quite benign and I was able to give him some reassurance. Principal Problem: Bipolar 1 disorder, mixed, severe (HCC) Diagnosis: Principal Problem:   Bipolar 1 disorder, mixed, severe (HCC) Active Problems:   Cluster B personality disorder in adult [facility])  Total Time spent with patient: 30 minutes  Past Psychiatric History: Past history of bipolar disorder diagnosis with depression and history of suicidal thinking  Past Medical History:  Past Medical History:  Diagnosis Date   Atrial fibrillation ([place])    a. diagnosed on 10/22/2017; b. successful DCCV on 12/14/2017; Paroxysmal Persistent   Basal cell carcinoma (BCC) of back    Dr. Elvera Lennox   CAD (coronary artery disease)    a. LHC 01/31/2018: OM2 55-60%, FFR 0.93. pLAD 25%   CKD (chronic kidney disease) stage 3, GFR 30-59 ml/min (HCC)    Unilateral kidney   Dilated cardiomyopathy -related to A. fib RVR; resolved 10/2017   Thought to be related to A. fib RVR: a) echo 11/03/2017 showed LVEF of 35-40% with severe LVH --> follow-up echo January 2020 showed resolution with EF 55 to 60%.  Normal filling pressures.  Read as mild as opposed to severe LVH   Diverticulosis of colon    With polyps (Dr. Amedeo Plenty March 2017, followed by Dr. Glennon Hamilton)   Erectile dysfunction    Essential hypertension 2018   Gout    OSA on CPAP    [facility]   Status post nephrectomy 1965   Thrombocytopenia, unspecified ([place])    Platelet clumping (pseudothrombocytopenia)    Past Surgical History:  Procedure Laterality Date   APPENDECTOMY     During childhood   ATRIAL FIBRILLATION ABLATION N/A 10/11/2020   Procedure: ATRIAL FIBRILLATION ABLATION;  Surgeon: Constance Haw, MD;  Location: [place] [facility];  Service: Cardiovascular;  Laterality: N/A;   CARDIOVERSION N/A 12/14/2017   Procedure: CARDIOVERSION;  Surgeon: Sueanne Margarita, MD;  Location: [facility] ENDOSCOPY;  Service: Cardiovascular;  Laterality: N/A;   CARDIOVERSION N/A 07/04/2020   Procedure: CARDIOVERSION;  Surgeon: Sueanne Margarita, MD;  Location: [place];  Service: Cardiovascular;  Laterality: N/A;   CYST EXCISION     face   INTRAVASCULAR PRESSURE WIRE/FFR STUDY N/A 01/31/2018   Procedure: INTRAVASCULAR PRESSURE WIRE/FFR STUDY;  Surgeon: Leonie Man, MD;  Location: [place] [facility];  Service: Cardiovascular;  Laterality: N/A;   LEFT HEART CATH AND CORONARY ANGIOGRAPHY N/A 01/31/2018   Procedure: LEFT HEART CATH AND CORONARY ANGIOGRAPHY;  Surgeon: Leonie Man, MD;  Location: [place] [facility];  Service: Cardiovascular: Single-vessel disease with roughly 60% lesion in major OM branch.  FFR negative.   NEPHRECTOMY Right 1965   TRANSTHORACIC ECHOCARDIOGRAM  10/2017    EF 35 -40% with severe LVH.  Diffuse global hypokinesis.  Mild aortic and mitral regurgitation.  Mild left and right atrial dilation.   TRANSTHORACIC ECHOCARDIOGRAM  02/2018   EF 55-60%, normal filling pressure. Mod LA dilation.  Mild to moderate TR. -> resolution of cardiomyopathy   VASECTOMY  1989   Family History:  Family History  Problem Relation Age of Onset   Colon cancer Mother    Hypertension Father    Seizures Father    Stroke Father    Dementia Paternal Grandmother    Heart attack Paternal Grandfather    Family Psychiatric  History: See previous Social History:   Social History   Substance and Sexual Activity  Alcohol Use Not Currently   Alcohol/week: 2.0 standard drinks of alcohol   Types: 1 Glasses of wine, 1 Cans of beer per week     Social History   Substance and Sexual Activity  Drug Use Never    Social History   Socioeconomic History   Marital status: Married    Spouse name: Not on file   Number of children: Not on file   Years of education: Not on file   Highest education level: Not on file  Occupational History   Not on file  Tobacco Use   Smoking status: Never   Smokeless tobacco: Never  Vaping Use   Vaping Use: Never used  Substance and Sexual Activity   Alcohol use: Not Currently    Alcohol/week: 2.0 standard drinks of alcohol    Types: 1 Glasses of wine, 1 Cans of beer per week   Drug use: Never   Sexual activity: Not on file  Other Topics Concern   Not on file  Social History Narrative   He is married now for 4 years (remarried).  He has 1 child (presumably from previous marriage -75 years old).   He lives with his wife.  He is an avid Chief Executive Officer.   He is a retired Producer, television/film/video for [employer].  (He has a BA in American studies at [employer].)   Never smoked.  Drinks 5-7 alcoholic beverages a week usually beer or hard cider.  May be occasionally a mixed drink.   He is quite active exercise at least 4 days a week for 30 minutes at a time.       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  He enjoys walking on both treadmill and in the community.  He plays golf routinely.   Social Determinants of Health   Financial Resource Strain: Not on file  Food Insecurity: No Food Insecurity (04/04/2022)   Hunger Vital Sign    Worried About Running Out of Food in the Last Year: Never true    Ran Out of Food in the Last Year: Never true  Transportation Needs: No Transportation Needs (04/04/2022)   PRAPARE - Armed forces logistics/support/administrative officer (Medical): No    Lack of Transportation (Non-Medical): No  Physical Activity: Not on file  Stress: Not on file  Social Connections: Not on file   Additional Social History:                         Sleep: Fair  Appetite:  Fair  Current Medications: Current Facility-Administered Medications  Medication Dose Route Frequency Provider Last Rate Last Admin   acetaminophen (TYLENOL) tablet 1,000 mg  1,000 mg Oral Q6H PRN Dixon, Rashaun M, NP       allopurinol (ZYLOPRIM) tablet 150 mg  150 mg Oral Daily Deloria Lair, NP  150 mg at 04/14/22 0923   alum & mag hydroxide-simeth (MAALOX/MYLANTA) 200-200-20 MG/5ML suspension 30 mL  30 mL Oral Q4H PRN Deloria Lair, NP       atorvastatin (LIPITOR) tablet 40 mg  40 mg Oral Daily Dixon, Rashaun M, NP   40 mg at 04/14/22 [id number]   docusate sodium (COLACE) capsule 100 mg  100 mg Oral BID Kandis Henry T, MD   100 mg at 04/14/22 [id number]   doxepin (SINEQUAN) capsule 75 mg  75 mg Oral QHS Parks Ranger, DO   75 mg at 04/13/22 2157   hydrochlorothiazide (HYDRODIURIL) tablet 25 mg  25 mg Oral Daily Parks Ranger, DO   25 mg at 04/14/22 [id number]   LORazepam (ATIVAN) tablet 0.5 mg  0.5 mg Oral BH-q8a4p Parks Ranger, DO   0.5 mg at 04/14/22 0810   losartan (COZAAR) tablet 100 mg  100 mg Oral Daily Parks Ranger, DO   100 mg at 04/14/22 [id number]   magnesium hydroxide (MILK OF MAGNESIA) suspension 30 mL  30 mL Oral Daily PRN Deloria Lair, NP   30 mL at 04/10/22 0839   metoprolol succinate (TOPROL-XL) 24 hr tablet 50 mg  50 mg Oral Daily Anette Riedel M, NP   50 mg at 04/14/22 [id number]   multivitamin (RENA-VIT) tablet 1 tablet  1 tablet Oral QHS Deloria Lair, NP   1 tablet at 04/13/22 2157   risperiDONE (RISPERDAL) tablet 1 mg  1 mg Oral BH-q8a4p Parks Ranger, DO   1 mg at 04/14/22 [id number]   rivaroxaban (XARELTO) tablet 20 mg  20 mg Oral Q supper Anette Riedel M, NP   20 mg at  04/13/22 1636   senna-docusate (Senokot-S) tablet 2 tablet  2 tablet Oral QPC breakfast Parks Ranger, DO   2 tablet at 04/14/22 [id number]   senna-docusate (Senokot-S) tablet 2 tablet  2 tablet Oral Daily PRN Jerrik Housholder, Madie Reno, MD       traZODone (DESYREL) tablet 50 mg  50 mg Oral QHS PRN Deloria Lair, NP       venlafaxine XR (EFFEXOR-XR) 24 hr capsule 300 mg  300 mg Oral QPC breakfast Parks Ranger, DO   300 mg at 04/14/22 [id number]    Lab Results: No results found for this or any previous visit (from the past 48 hour(s)).  Blood Alcohol level:  Lab Results  Component Value Date   ETH <10 [id number]    Metabolic Disorder Labs: Lab Results  Component Value Date   HGBA1C 4.8 04/08/2022   MPG 91.06 04/08/2022   No results found for: "PROLACTIN" Lab Results  Component Value Date   CHOL 90 04/08/2022   TRIG 83 04/08/2022   HDL 35 (L) 04/08/2022   CHOLHDL 2.6 04/08/2022   VLDL 17 04/08/2022   LDLCALC 38 04/08/2022   LDLCALC 87 02/01/2018    Physical Findings: AIMS:  , ,  ,  ,    CIWA:    COWS:     Musculoskeletal: Strength & Muscle Tone: within normal limits Gait & Station: normal Patient leans: N/A  Psychiatric Specialty Exam:  Presentation  General Appearance:  Appropriate for Environment  Eye Contact: Fair  Speech: Slurred  Speech Volume: Normal  Handedness: Right   Mood and Affect  Mood: Hopeless  Affect: Depressed; Flat   Thought Process  Thought Processes: Coherent  Descriptions of Associations:Intact  Orientation:Full (Time, Place and Person)  Thought Content:WDL  History of Schizophrenia/Schizoaffective disorder:No data recorded  Duration of Psychotic Symptoms:No data recorded Hallucinations:No data recorded Ideas of Reference:None  Suicidal Thoughts:No data recorded Homicidal Thoughts:No data recorded  Sensorium  Memory: Immediate Good; Remote Good; Recent Good  Judgment: Fair  Insight: Good   Executive  Functions  Concentration: Good  Attention Span: Good  Recall: Good  Fund of Knowledge: Good  Language: Good   Psychomotor Activity  Psychomotor Activity:No data recorded  Assets  Assets: Communication Skills; Financial Resources/Insurance; Housing   Sleep  Sleep:No data recorded   Physical Exam: Physical Exam Vitals and nursing note reviewed.  Constitutional:      Appearance: Normal appearance.  HENT:     Head: Normocephalic and atraumatic.     Mouth/Throat:     Pharynx: Oropharynx is clear.  Eyes:     Pupils: Pupils are equal, round, and reactive to light.  Cardiovascular:     Rate and Rhythm: Normal rate and regular rhythm.  Pulmonary:     Effort: Pulmonary effort is normal.     Breath sounds: Normal breath sounds.  Abdominal:     General: Abdomen is flat.     Palpations: Abdomen is soft.  Musculoskeletal:        General: Normal range of motion.  Skin:    General: Skin is warm and dry.  Neurological:     General: No focal deficit present.     Mental Status: He is alert. Mental status is at baseline.  Psychiatric:        Attention and Perception: Attention normal.        Mood and Affect: Mood normal.        Speech: Speech normal.        Behavior: Behavior normal.        Thought Content: Thought content normal.        Cognition and Memory: Cognition normal.    Review of Systems  Constitutional: Negative.   HENT: Negative.    Eyes: Negative.   Respiratory: Negative.    Cardiovascular: Negative.   Gastrointestinal: Negative.   Musculoskeletal: Negative.   Skin: Negative.   Neurological: Negative.   Psychiatric/Behavioral: Negative.     Blood pressure 134/79, pulse 64, temperature (!) 97.5 F (36.4 C), temperature source Oral, resp. rate 16, height 5' 10"$  (1.778 m), weight 104.3 kg, SpO2 99 %. Body mass index is 33 kg/m.   Treatment Plan Summary: Medication management and Plan patient appears psychiatrically stable.  Last several days has  been doing well without complaints or outward signs of any severe distress.  Tolerating medicine well.  Vital signs stable.  Most recent lab studies were a CBC that only showed a low-ish platelet count but that appears to be chronic for him as well as a normal hemoglobin A1c and a normal lipid panel except for a slightly low HDL.  Patient is agreeable to the plan for discharge tomorrow and has outpatient treatment in place.  He prefers paper prescriptions to electronic prescriptions.  I will begin making plans for discharge.  Alethia Berthold, MD 04/14/2022, 11:01 AM

## 2022-04-14 NOTE — Plan of Care (Signed)
Pt endorses anxiety/depression at this time however, states that it is improving. Pt denies SI/HI/AVH or pain at this time. Pt is calm and cooperative. Pt is medication compliant. Pt provided with support and encouragement. Pt monitored q15 minutes for safety per unit policy. Plan of care ongoing.   Pt has c/o constipation although he is on scheduled senkot and a stool softener. Pt informed this Probation officer will speak with the MD about the matter to see what further steps can be taken.  Pt given milk of mg this morning. This afternoon pt reported having a BM stating it was more than a little but not a large one. Pt would like to have a better BM. Pt reports he will drink more fluids and be more active in movement prior to trying further medication due to discharging tomorrow.  Problem: Education: Goal: Knowledge of General Education information will improve Description: Including pain rating scale, medication(s)/side effects and non-pharmacologic comfort measures Outcome: Progressing   Problem: Nutrition: Goal: Adequate nutrition will be maintained Outcome: Progressing

## 2022-04-14 NOTE — Group Note (Signed)
Recreation Therapy Group Note   Group Topic:Coping Skills  Group Date: 04/14/2022 Start Time: 1400 End Time: 1445 Facilitators: Vilma Prader, LRT, CTRS Location:  Dayroom  Group Description: Seated Exercise. LRT discussed the mental and physical benefits of exercise. LRT and group discussed how physical activity can be used as a coping skill. Pt's and LRT followed along to an exercise video on the TV screen that provided a visual representation and audio description of every exercise performed. Pt's encouraged to listen to their bodies and stop at any time if they experience feelings of discomfort or pain. LRT passed out water after session was over and encouraged pts do drink and stay hydrated.  Affect/Mood: Appropriate   Participation Level: Active and Engaged   Participation Quality: Independent   Behavior: Appropriate   Speech/Thought Process: Coherent and Directed   Insight: Good   Judgement: Good   Modes of Intervention: Activity and Education   Patient Response to Interventions:  Attentive, Engaged, Interested , and Receptive   Education Outcome:  Acknowledges education   Clinical Observations/Individualized Feedback: Mykeal was active in their participation of session activities and group discussion. Pt identified that adding water bottles as weights made the exercising a little more challenging. Pt shared that he is looking forward to home home tomorrow.   Plan: Continue to engage patient in RT group sessions 2-3x/week.   Vilma Prader, LRT, [place] 04/14/2022 2:55 PM

## 2022-04-14 NOTE — Care Management Important Message (Signed)
Important Message  Patient Details  Name: Bruce Yoder MRN: [id number] Date of Birth: 1945/08/02   Medicare Important Message Given:  Yes     Jaedan Schuman A [place], Latanya Presser 04/14/2022, 2:17 PM

## 2022-04-14 NOTE — Group Note (Signed)
[facility] LCSW Group Therapy Note   Group Date: 04/14/2022 Start Time: 1330 End Time: 1345   Type of Therapy/Topic:  Group Therapy:  Emotion Regulation  Participation Level:  Did Not Attend    Description of Group:    The purpose of this group is to assist patients in learning to regulate negative emotions and experience positive emotions. Patients will be guided to discuss ways in which they have been vulnerable to their negative emotions. These vulnerabilities will be juxtaposed with experiences of positive emotions or situations, and patients challenged to use positive emotions to combat negative ones. Special emphasis will be placed on coping with negative emotions in conflict situations, and patients will process healthy conflict resolution skills.  Therapeutic Goals: Patient will identify two positive emotions or experiences to reflect on in order to balance out negative emotions:  Patient will label two or more emotions that they find the most difficult to experience:  Patient will be able to demonstrate positive conflict resolution skills through discussion or role plays:   Summary of Patient Progress:   Patient was present for the entirety of the group session. Patient was an active listener and participated in the topic of discussion. He stated that though he did not feel too much change to the medication he felt better and well enough to go home. He said that he was still nervous and somewhat anxious to complete all the tasks that he has to do. "I can do meditation" pt said when referring to using his skills to cope with his anxiety. He also said that reading takes his mind off things and hopes that will be helpful when he leaves.   Therapeutic Modalities:   Cognitive Behavioral Therapy Feelings Identification Dialectical Behavioral Therapy   Storm Sovine A [place], LCSWA

## 2022-04-15 DIAGNOSIS — F3163 Bipolar disorder, current episode mixed, severe, without psychotic features: Secondary | ICD-10-CM | POA: Diagnosis not present

## 2022-04-15 MED ORDER — HYDROCHLOROTHIAZIDE 25 MG PO TABS: 25.0000 mg | ORAL_TABLET | Freq: Every day | ORAL | 0 refills | Status: DC

## 2022-04-15 MED ORDER — RISPERIDONE 1 MG PO TABS: 1.0000 mg | ORAL_TABLET | ORAL | 0 refills | Status: DC

## 2022-04-15 MED ORDER — DOXEPIN HCL 75 MG PO CAPS: 75.0000 mg | ORAL_CAPSULE | Freq: Every day | ORAL | 0 refills | Status: AC

## 2022-04-15 MED ORDER — LORAZEPAM 0.5 MG PO TABS: 0.5000 mg | ORAL_TABLET | ORAL | 0 refills | Status: AC

## 2022-04-15 MED ORDER — VENLAFAXINE HCL ER 150 MG PO CP24: 300.0000 mg | ORAL_CAPSULE | Freq: Every day | ORAL | 0 refills | Status: DC

## 2022-04-15 MED ORDER — TRAZODONE HCL 50 MG PO TABS: 50.0000 mg | ORAL_TABLET | Freq: Every evening | ORAL | 0 refills | Status: DC | PRN

## 2022-04-15 NOTE — Progress Notes (Signed)
  [facility] Adult Case Management Discharge Plan :  Will you be returning to the same living situation after discharge:  Yes,  pt will be returning home At discharge, do you have transportation home?: Yes,  pt's family member will be providing transportation Do you have the ability to pay for your medications: Yes,  pt has Medicare Part A&B  Release of information consent forms completed and in the chart;  Patient's signature needed at discharge.  Patient to Follow up at:  Follow-up Information     [facility] Follow up on 04/22/2022.   Why: You have an appointment scheduled for medication management on Wednesday February 28th at 2pm. Thanks! Contact information: [place] [address] [phone]         [facility] Follow up on 04/21/2022.   Why: You have a follow up appointment scheduled with your therapist on Tuesday, February 27th at 11am. Please contact your therapist if you need to reschedule. Thanks! Contact information: [address] [place] [address] [phone]                 Next level of care provider has access to [facility] and Suicide Prevention discussed: Yes,  SPE completed with pt's daughter     Has patient been referred to the Quitline?: N/A patient is not a smoker  Patient has been referred for addiction treatment: N/A  Mcihael Hinderman A [place], [place] 04/15/2022, 10:28 AM

## 2022-04-15 NOTE — [facility] Group Notes (Signed)
[place] Group Notes:  (Nursing/MHT/Case Management/Adjunct)  Date:  04/15/2022  Time:  11:40 AM  Type of Therapy:   Community Group  Participation Level:  Active  Participation Quality:  Appropriate and Attentive  Affect:  Appropriate  Cognitive:  Alert and Appropriate  Insight:  Appropriate  Engagement in Group:  Engaged  Modes of Intervention:  Activity and Discussion  Summary of Progress/Problems:  Bruce Yoder 04/15/2022, 11:40 AM

## 2022-04-15 NOTE — Progress Notes (Signed)
   04/14/22 2100  Psych Admission Type (Psych Patients Only)  Admission Status Voluntary  Psychosocial Assessment  Patient Complaints None  Eye Contact Fair  Facial Expression Other (Comment) (WNL)  Affect Appropriate to circumstance  Speech Logical/coherent  Interaction Assertive  Motor Activity Slow  Appearance/Hygiene Unremarkable  Behavior Characteristics Cooperative  Mood Pleasant  Thought Process  Coherency WDL  Content WDL  Delusions None reported or observed  Perception WDL  Hallucination None reported or observed  Judgment WDL  Confusion None  Danger to Self  Current suicidal ideation? Denies  Agreement Not to Harm Self Yes  Description of Agreement Verbal  Danger to Others  Danger to Others None reported or observed

## 2022-04-15 NOTE — Discharge Summary (Signed)
Physician Discharge Summary Note  Patient:  Bruce Yoder is an 77 y.o., male MRN:  [id number] DOB:  1945/06/29 Patient phone:  [phone] (home)  Patient address:   [address] [place] [address],  Total Time spent with patient: 45 minutes  Date of Admission:  04/04/2022 Date of Discharge: 04/15/2022  Reason for Admission:  overdose, suicide attempt  Principal Problem: Bipolar 1 disorder, mixed, severe ([place]) Discharge Diagnoses: Principal Problem:   Bipolar 1 disorder, mixed, severe ([place]) Active Problems:   Cluster B personality disorder in adult [facility])   Past Psychiatric History: bipolar disorder, personality d/o  Past Medical History:  Past Medical History:  Diagnosis Date   Atrial fibrillation ([place])    a. diagnosed on 10/22/2017; b. successful DCCV on 12/14/2017; Paroxysmal Persistent   Basal cell carcinoma (BCC) of back    Dr. Elvera Lennox   CAD (coronary artery disease)    a. LHC 01/31/2018: OM2 55-60%, FFR 0.93. pLAD 25%   CKD (chronic kidney disease) stage 3, GFR 30-59 ml/min (HCC)    Unilateral kidney   Dilated cardiomyopathy -related to A. fib RVR; resolved 10/2017   Thought to be related to A. fib RVR: a) echo 11/03/2017 showed LVEF of 35-40% with severe LVH --> follow-up echo January 2020 showed resolution with EF 55 to 60%.  Normal filling pressures.  Read as mild as opposed to severe LVH   Diverticulosis of colon    With polyps (Dr. Amedeo Plenty March 2017, followed by Dr. Glennon Hamilton)   Erectile dysfunction    Essential hypertension 2018   Gout    OSA on CPAP    [facility]   Status post nephrectomy 1965   Thrombocytopenia, unspecified ([place])    Platelet clumping (pseudothrombocytopenia)    Past Surgical History:  Procedure Laterality Date   APPENDECTOMY     During childhood   ATRIAL FIBRILLATION ABLATION N/A 10/11/2020   Procedure: ATRIAL FIBRILLATION ABLATION;  Surgeon: Constance Haw, MD;  Location: [facility];   Service: Cardiovascular;  Laterality: N/A;   CARDIOVERSION N/A 12/14/2017   Procedure: CARDIOVERSION;  Surgeon: Sueanne Margarita, MD;  Location: [facility] ENDOSCOPY;  Service: Cardiovascular;  Laterality: N/A;   CARDIOVERSION N/A 07/04/2020   Procedure: CARDIOVERSION;  Surgeon: Sueanne Margarita, MD;  Location: [facility];  Service: Cardiovascular;  Laterality: N/A;   CYST EXCISION     face   INTRAVASCULAR PRESSURE WIRE/FFR STUDY N/A 01/31/2018   Procedure: INTRAVASCULAR PRESSURE WIRE/FFR STUDY;  Surgeon: Leonie Man, MD;  Location: [facility];  Service: Cardiovascular;  Laterality: N/A;   LEFT HEART CATH AND CORONARY ANGIOGRAPHY N/A 01/31/2018   Procedure: LEFT HEART CATH AND CORONARY ANGIOGRAPHY;  Surgeon: Leonie Man, MD;  Location: [place] [facility];  Service: Cardiovascular: Single-vessel disease with roughly 60% lesion in major OM branch.  FFR negative.   NEPHRECTOMY Right 1965   TRANSTHORACIC ECHOCARDIOGRAM  10/2017    EF 35 -40% with severe LVH.  Diffuse global hypokinesis.  Mild aortic and mitral regurgitation.  Mild left and right atrial dilation.   TRANSTHORACIC ECHOCARDIOGRAM  02/2018   EF 55-60%, normal filling pressure. Mod LA dilation.  Mild to moderate TR. -> resolution of cardiomyopathy   VASECTOMY  1989   Family History:  Family History  Problem Relation Age of Onset   Colon cancer Mother    Hypertension Father    Seizures Father    Stroke Father    Dementia Paternal Grandmother  Heart attack Paternal Grandfather    Family Psychiatric  History: see above Social History:  Social History   Substance and Sexual Activity  Alcohol Use Not Currently   Alcohol/week: 2.0 standard drinks of alcohol   Types: 1 Glasses of wine, 1 Cans of beer per week     Social History   Substance and Sexual Activity  Drug Use Never    Social History   Socioeconomic History   Marital status: Married    Spouse name: Not on file   Number of children: Not on file   Years  of education: Not on file   Highest education level: Not on file  Occupational History   Not on file  Tobacco Use   Smoking status: Never   Smokeless tobacco: Never  Vaping Use   Vaping Use: Never used  Substance and Sexual Activity   Alcohol use: Not Currently    Alcohol/week: 2.0 standard drinks of alcohol    Types: 1 Glasses of wine, 1 Cans of beer per week   Drug use: Never   Sexual activity: Not on file  Other Topics Concern   Not on file  Social History Narrative   He is married now for 4 years (remarried).  He has 1 child (presumably from previous marriage -75 years old).   He lives with his wife.  He is an avid Chief Executive Officer.   He is a retired Producer, television/film/video for [employer].  (He has a BA in American studies at [employer].)   Never smoked.  Drinks 5-7 alcoholic beverages a week usually beer or hard cider.  May be occasionally a mixed drink.   He is quite active exercise at least 4 days a week for 30 minutes at a time.       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  He enjoys walking on both treadmill and in the community.  He plays golf routinely.   Social Determinants of Health   Financial Resource Strain: Not on file  Food Insecurity: No Food Insecurity (04/04/2022)   Hunger Vital Sign    Worried About Running Out of Food in the Last Year: Never true    Ran Out of Food in the Last Year: Never true  Transportation Needs: No Transportation Needs (04/04/2022)   PRAPARE - Hydrologist (Medical): No    Lack of Transportation (Non-Medical): No  Physical Activity: Not on file  Stress: Not on file  Social Connections: Not on file    Hospital Course:   77 yo male admitted after an overdose in a suicide attempt, history of bipolar disorder.  Medications were started and adjusted along with therapy initiation.  He stabilized and has met maximum  benefit of hospitalization.  No suicidal/homicidal ideations, hallucinations, or substance abuse.  Rx sent to his pharmacy. Discharge instructions provided and explained along with crisis numbers and follow up appointment information.  Psych cleared for discharge.  Musculoskeletal: Strength & Muscle Tone: within normal limits Gait & Station: normal Patient leans: N/A  Psychiatric Specialty Exam: Physical Exam Vitals and nursing note reviewed.  Constitutional:      Appearance: Normal appearance.  HENT:     Head: Normocephalic.     Nose: Nose normal.  Pulmonary:     Effort: Pulmonary effort is normal.  Musculoskeletal:        General: Normal range of motion.  Cervical back: Normal range of motion.  Neurological:     General: No focal deficit present.     Mental Status: He is alert and oriented to person, place, and time.  Psychiatric:        Attention and Perception: Attention and perception normal.        Mood and Affect: Mood is anxious.        Speech: Speech normal.        Behavior: Behavior normal. Behavior is cooperative.        Thought Content: Thought content normal.        Cognition and Memory: Cognition and memory normal.        Judgment: Judgment normal.     Review of Systems  Psychiatric/Behavioral:  The patient is nervous/anxious.   All other systems reviewed and are negative.   Blood pressure 110/61, pulse (!) 124, temperature 97.7 F (36.5 C), temperature source Oral, resp. rate 19, height 5' 10"$  (1.778 m), weight 104.3 kg, SpO2 99 %.Body mass index is 33 kg/m.  General Appearance: Casual  Eye Contact:  Good  Speech:  Normal Rate  Volume:  Normal  Mood:  Anxious  Affect:  Congruent  Thought Process:  Coherent  Orientation:  Full (Time, Place, and Person)  Thought Content:  WDL and Logical  Suicidal Thoughts:  No  Homicidal Thoughts:  No  Memory:  Immediate;   Good Recent;   Good Remote;   Good  Judgement:  Good  Insight:  Good  Psychomotor  Activity:  Normal  Concentration:  Concentration: Good and Attention Span: Good  Recall:  Good  Fund of Knowledge:  Good  Language:  Good  Akathisia:  No  Handed:  Right  AIMS (if indicated):     Assets:  Housing Leisure Time Physical Health Resilience Social Support  ADL's:  Intact  Cognition:  WNL  Sleep:         Physical Exam: Physical Exam Vitals and nursing note reviewed.  Constitutional:      Appearance: Normal appearance.  HENT:     Head: Normocephalic.     Nose: Nose normal.  Pulmonary:     Effort: Pulmonary effort is normal.  Musculoskeletal:        General: Normal range of motion.     Cervical back: Normal range of motion.  Neurological:     General: No focal deficit present.     Mental Status: He is alert and oriented to person, place, and time.  Psychiatric:        Attention and Perception: Attention and perception normal.        Mood and Affect: Mood is anxious.        Speech: Speech normal.        Behavior: Behavior normal. Behavior is cooperative.        Thought Content: Thought content normal.        Cognition and Memory: Cognition and memory normal.        Judgment: Judgment normal.    Review of Systems  Psychiatric/Behavioral:  The patient is nervous/anxious.   All other systems reviewed and are negative.  Blood pressure 110/61, pulse (!) 124, temperature 97.7 F (36.5 C), temperature source Oral, resp. rate 19, height 5' 10"$  (1.778 m), weight 104.3 kg, SpO2 99 %. Body mass index is 33 kg/m.   Social History   Tobacco Use  Smoking Status Never  Smokeless Tobacco Never   Tobacco Cessation:  N/A, patient does not currently use tobacco  products   Blood Alcohol level:  Lab Results  Component Value Date   ETH <10 [id number]    Metabolic Disorder Labs:  Lab Results  Component Value Date   HGBA1C 4.8 04/08/2022   MPG 91.06 04/08/2022   No results found for: "PROLACTIN" Lab Results  Component Value Date   CHOL 90 04/08/2022    TRIG 83 04/08/2022   HDL 35 (L) 04/08/2022   CHOLHDL 2.6 04/08/2022   VLDL 17 04/08/2022   LDLCALC 38 04/08/2022   LDLCALC 87 02/01/2018    See Psychiatric Specialty Exam and Suicide Risk Assessment completed by Attending Physician prior to discharge.  Discharge destination:  Home  Is patient on multiple antipsychotic therapies at discharge:  No   Has Patient had three or more failed trials of antipsychotic monotherapy by history:  No  Recommended Plan for Multiple Antipsychotic Therapies: NA  Discharge Instructions     Diet - low sodium heart healthy   Complete by: As directed    Discharge instructions   Complete by: As directed    Follow up with outpatient provider   Increase activity slowly   Complete by: As directed       Allergies as of 04/15/2022   No Known Allergies      Medication List     STOP taking these medications    ARIPiprazole 2 MG tablet Commonly known as: ABILIFY   clonazePAM 0.5 MG tablet Commonly known as: KLONOPIN   hydrOXYzine 25 MG tablet Commonly known as: ATARAX   mirtazapine 15 MG tablet Commonly known as: REMERON   QUEtiapine 50 MG tablet Commonly known as: SEROQUEL   sertraline 50 MG tablet Commonly known as: ZOLOFT       TAKE these medications      Indication  acetaminophen 500 MG tablet Commonly known as: TYLENOL Take 1,000 mg by mouth every 6 (six) hours as needed for moderate pain.    allopurinol 300 MG tablet Commonly known as: ZYLOPRIM Take 150 mg by mouth daily.    atorvastatin 40 MG tablet Commonly known as: LIPITOR Take 1 tablet (40 mg total) by mouth daily.    b complex-vitamin c-folic acid 0.8 MG Tabs tablet Take 1 tablet by mouth 2 (two) times daily.    doxepin 75 MG capsule Commonly known as: SINEQUAN Take 1 capsule (75 mg total) by mouth at bedtime.  Indication: sleep   hydrochlorothiazide 25 MG tablet Commonly known as: HYDRODIURIL Take 1 tablet (25 mg total) by mouth daily. Start taking  on: April 16, 2022  Indication: High Blood Pressure Disorder   loratadine 10 MG tablet Commonly known as: CLARITIN Take 10 mg by mouth daily as needed for allergies.    LORazepam 0.5 MG tablet Commonly known as: ATIVAN Take 1 tablet (0.5 mg total) by mouth 2 (two) times daily at 8 am and 4 pm.  Indication: Feeling Anxious   losartan 50 MG tablet Commonly known as: COZAAR Take 1 tablet (50 mg total) by mouth daily.    metoprolol succinate 50 MG 24 hr tablet Commonly known as: TOPROL-XL TAKE 1 TABLET BY MOUTH ONCE DAILY WITH MEALS    risperiDONE 1 MG tablet Commonly known as: RISPERDAL Take 1 tablet (1 mg total) by mouth 2 (two) times daily at 8 am and 4 pm.  Indication: Manic Phase of Manic-Depression   traZODone 50 MG tablet Commonly known as: DESYREL Take 1 tablet (50 mg total) by mouth at bedtime as needed for sleep.  Indication: Trouble Sleeping  venlafaxine XR 150 MG 24 hr capsule Commonly known as: EFFEXOR-XR Take 2 capsules (300 mg total) by mouth daily after breakfast. Start taking on: April 16, 2022  Indication: Major Depressive Disorder   Xarelto 20 MG Tabs tablet Generic drug: rivaroxaban TAKE 1 TABLET DAILY WITH SUPPER. What changed: See the new instructions.         Follow-up Information     [facility] Follow up on 04/22/2022.   Why: You have an appointment scheduled for medication management on Wednesday February 28th at 2pm. Thanks! Contact information: [place] [address] [phone]         [facility] Follow up on 04/21/2022.   Why: You have a follow up appointment scheduled with your therapist on Tuesday, February 27th at 11am. Please contact your therapist if you need to reschedule. Thanks! Contact information: [place] [address] [phone]                 Follow-up recommendations:   Activity:  as tolerated Diet:  heart healthy diet  Bipolar  affective disorder, mixed, severe: Effexor 300 mg daily Risperdal 1 mg BID   Anxiety: Ativan 0.5 mg BID Discontinued the PRN Ativan   Insomnia: Trazodone 50 mg at bedtime PRN Doxepin 75 mg at bedtime   Comments:  Follow up with appointment above  Signed: Waylan Boga, NP 04/15/2022, 9:33 AM

## 2022-04-15 NOTE — Progress Notes (Signed)
Patient denies SI, HI, and AVH. Patient reports that he feels his goals at the hospital have been met, which is to be stabilized with his thoughts of SI. Patient says he has reached out to his support system, which is his daughter and a good friend. He has follow up appointments scheduled. He denies any medication side effects.   Patient was educated on AVS. This Probation officer went over follow up appointments, suicide safety plan, medications, prescriptions, and crisis resources with patient. Patient questions were answered. Patient was also given belongings from the locker and safe. Patient signed off on belongings sheets.   Patient is not observed to be in distress at time of discharge.

## 2022-04-15 NOTE — [facility] Suicide Risk Assessment (Cosign Needed)
Suicide Risk Assessment  [facility] Discharge Suicide Risk Assessment   Principal Problem: Bipolar 1 disorder, mixed, severe ([place]) Discharge Diagnoses: Principal Problem:   Bipolar 1 disorder, mixed, severe ([place]) Active Problems:   Cluster B personality disorder in adult [facility])  Client admitted for an overdose.  Medications started and adjusted along with therapy initiation.  He has stabilized with no suicidal/homicidal ideations, hallucinations, or substance abuse issues.  Total Time spent with patient: 30 minutes  Musculoskeletal: Strength & Muscle Tone: within normal limits Gait & Station: normal Patient leans: N/A  Psychiatric Specialty Exam: Physical Exam Vitals and nursing note reviewed.  Constitutional:      Appearance: Normal appearance.  HENT:     Head: Normocephalic.     Nose: Nose normal.  Pulmonary:     Effort: Pulmonary effort is normal.  Musculoskeletal:        General: Normal range of motion.     Cervical back: Normal range of motion.  Neurological:     General: No focal deficit present.     Mental Status: He is alert and oriented to person, place, and time.  Psychiatric:        Attention and Perception: Attention and perception normal.        Mood and Affect: Mood is anxious.        Speech: Speech normal.        Behavior: Behavior normal. Behavior is cooperative.        Thought Content: Thought content normal.        Cognition and Memory: Cognition and memory normal.        Judgment: Judgment normal.     Review of Systems  Psychiatric/Behavioral:  The patient is nervous/anxious.   All other systems reviewed and are negative.   Blood pressure 110/61, pulse (!) 124, temperature 97.7 F (36.5 C), temperature source Oral, resp. rate 19, height 5' 10"$  (1.778 m), weight 104.3 kg, SpO2 99 %.Body mass index is 33 kg/m.  General Appearance: Casual  Eye Contact:  Good  Speech:  Normal Rate  Volume:  Normal  Mood:  Anxious  Affect:  Congruent  Thought Process:   Coherent  Orientation:  Full (Time, Place, and Person)  Thought Content:  WDL and Logical  Suicidal Thoughts:  No  Homicidal Thoughts:  No  Memory:  Immediate;   Good Recent;   Good Remote;   Good  Judgement:  Good  Insight:  Good  Psychomotor Activity:  Normal  Concentration:  Concentration: Good and Attention Span: Good  Recall:  Good  Fund of Knowledge:  Good  Language:  Good  Akathisia:  No  Handed:  Right  AIMS (if indicated):     Assets:  Housing Leisure Time Physical Health Resilience Social Support  ADL's:  Intact  Cognition:  WNL  Sleep:        Physical Exam: Physical Exam Vitals and nursing note reviewed.  Constitutional:      Appearance: Normal appearance.  HENT:     Head: Normocephalic.     Nose: Nose normal.  Pulmonary:     Effort: Pulmonary effort is normal.  Musculoskeletal:        General: Normal range of motion.     Cervical back: Normal range of motion.  Neurological:     General: No focal deficit present.     Mental Status: He is alert and oriented to person, place, and time.  Psychiatric:        Attention and Perception: Attention and perception  normal.        Mood and Affect: Mood is anxious.        Speech: Speech normal.        Behavior: Behavior normal. Behavior is cooperative.        Thought Content: Thought content normal.        Cognition and Memory: Cognition and memory normal.        Judgment: Judgment normal.    Review of Systems  Psychiatric/Behavioral:  The patient is nervous/anxious.   All other systems reviewed and are negative.  Blood pressure 110/61, pulse (!) 124, temperature 97.7 F (36.5 C), temperature source Oral, resp. rate 19, height 5' 10"$  (1.778 m), weight 104.3 kg, SpO2 99 %. Body mass index is 33 kg/m.  Mental Status Per Nursing Assessment::   On Admission:  NA  Demographic Factors:  Male, Age 75 or older, Caucasian, and Living alone  Loss Factors: NA  Historical Factors: Prior suicide  attempts  Risk Reduction Factors:   Sense of responsibility to family, Positive social support, Positive therapeutic relationship, and Positive coping skills or problem solving skills  Continued Clinical Symptoms:  Anxiety, mild  Cognitive Features That Contribute To Risk:  None    Suicide Risk:  Minimal: No identifiable suicidal ideation.  Patients presenting with no risk factors but with morbid ruminations; may be classified as minimal risk based on the severity of the depressive symptoms   Follow-up Information     [facility] Follow up on 04/22/2022.   Why: You have an appointment scheduled for medication management on Wednesday February 28th at 2pm. Thanks! Contact information: [place] [address] [phone]         Counsel[facility], [facility] Follow up on 04/21/2022.   Why: You have a follow up appointment scheduled with your therapist on Tuesday, February 27th at 11am. Please contact your therapist if you need to reschedule. Thanks! Contact information: [address] [place] [address] [phone]                 Plan Of Care/Follow-up recommendations:  Activity:  as tolerated Diet:  heart healthy diet  Bipolar affective disorder, mixed, severe: Effexor 300 mg daily Risperdal 1 mg BID   Anxiety: Ativan 0.5 mg BID Discontinued the PRN Ativan   Insomnia: Trazodone 50 mg at bedtime PRN Doxepin 75 mg at bedtime   Waylan Boga, NP 04/15/2022, 9:27 AM

## 2022-04-21 DIAGNOSIS — F4323 Adjustment disorder with mixed anxiety and depressed mood: Secondary | ICD-10-CM | POA: Diagnosis not present

## 2022-04-22 DIAGNOSIS — F331 Major depressive disorder, recurrent, moderate: Secondary | ICD-10-CM | POA: Diagnosis not present

## 2022-04-22 DIAGNOSIS — F3173 Bipolar disorder, in partial remission, most recent episode manic: Secondary | ICD-10-CM | POA: Diagnosis not present

## 2022-04-22 DIAGNOSIS — F411 Generalized anxiety disorder: Secondary | ICD-10-CM | POA: Diagnosis not present

## 2022-04-28 DIAGNOSIS — F4323 Adjustment disorder with mixed anxiety and depressed mood: Secondary | ICD-10-CM | POA: Diagnosis not present

## 2022-05-07 DIAGNOSIS — F411 Generalized anxiety disorder: Secondary | ICD-10-CM | POA: Diagnosis not present

## 2022-05-07 DIAGNOSIS — F4323 Adjustment disorder with mixed anxiety and depressed mood: Secondary | ICD-10-CM | POA: Diagnosis not present

## 2022-05-07 DIAGNOSIS — F3173 Bipolar disorder, in partial remission, most recent episode manic: Secondary | ICD-10-CM | POA: Diagnosis not present

## 2022-05-08 DIAGNOSIS — I2510 Atherosclerotic heart disease of native coronary artery without angina pectoris: Secondary | ICD-10-CM | POA: Diagnosis not present

## 2022-05-08 DIAGNOSIS — I480 Paroxysmal atrial fibrillation: Secondary | ICD-10-CM | POA: Diagnosis not present

## 2022-05-08 DIAGNOSIS — I4819 Other persistent atrial fibrillation: Secondary | ICD-10-CM | POA: Diagnosis not present

## 2022-05-08 DIAGNOSIS — I10 Essential (primary) hypertension: Secondary | ICD-10-CM | POA: Diagnosis not present

## 2022-05-13 DIAGNOSIS — N183 Chronic kidney disease, stage 3 unspecified: Secondary | ICD-10-CM | POA: Diagnosis not present

## 2022-05-13 DIAGNOSIS — I4819 Other persistent atrial fibrillation: Secondary | ICD-10-CM | POA: Diagnosis not present

## 2022-05-13 DIAGNOSIS — I428 Other cardiomyopathies: Secondary | ICD-10-CM | POA: Diagnosis not present

## 2022-05-13 DIAGNOSIS — Z7901 Long term (current) use of anticoagulants: Secondary | ICD-10-CM | POA: Diagnosis not present

## 2022-05-13 DIAGNOSIS — D696 Thrombocytopenia, unspecified: Secondary | ICD-10-CM | POA: Diagnosis not present

## 2022-05-13 DIAGNOSIS — Z01810 Encounter for preprocedural cardiovascular examination: Secondary | ICD-10-CM | POA: Diagnosis not present

## 2022-05-13 DIAGNOSIS — Z01812 Encounter for preprocedural laboratory examination: Secondary | ICD-10-CM | POA: Diagnosis not present

## 2022-05-13 DIAGNOSIS — I129 Hypertensive chronic kidney disease with stage 1 through stage 4 chronic kidney disease, or unspecified chronic kidney disease: Secondary | ICD-10-CM | POA: Diagnosis not present

## 2022-05-13 DIAGNOSIS — I480 Paroxysmal atrial fibrillation: Secondary | ICD-10-CM | POA: Diagnosis not present

## 2022-05-13 DIAGNOSIS — I2510 Atherosclerotic heart disease of native coronary artery without angina pectoris: Secondary | ICD-10-CM | POA: Diagnosis not present

## 2022-05-13 DIAGNOSIS — I4510 Unspecified right bundle-branch block: Secondary | ICD-10-CM | POA: Diagnosis not present

## 2022-05-13 DIAGNOSIS — G4733 Obstructive sleep apnea (adult) (pediatric): Secondary | ICD-10-CM | POA: Diagnosis not present

## 2022-05-14 DIAGNOSIS — F4323 Adjustment disorder with mixed anxiety and depressed mood: Secondary | ICD-10-CM | POA: Diagnosis not present

## 2022-05-21 DIAGNOSIS — F4323 Adjustment disorder with mixed anxiety and depressed mood: Secondary | ICD-10-CM | POA: Diagnosis not present

## 2022-05-22 DIAGNOSIS — F331 Major depressive disorder, recurrent, moderate: Secondary | ICD-10-CM | POA: Diagnosis not present

## 2022-05-22 DIAGNOSIS — F411 Generalized anxiety disorder: Secondary | ICD-10-CM | POA: Diagnosis not present

## 2022-05-22 DIAGNOSIS — F3173 Bipolar disorder, in partial remission, most recent episode manic: Secondary | ICD-10-CM | POA: Diagnosis not present

## 2022-05-27 DIAGNOSIS — Z905 Acquired absence of kidney: Secondary | ICD-10-CM | POA: Diagnosis not present

## 2022-05-27 DIAGNOSIS — E213 Hyperparathyroidism, unspecified: Secondary | ICD-10-CM | POA: Diagnosis not present

## 2022-05-27 DIAGNOSIS — G4733 Obstructive sleep apnea (adult) (pediatric): Secondary | ICD-10-CM | POA: Diagnosis not present

## 2022-05-27 DIAGNOSIS — M109 Gout, unspecified: Secondary | ICD-10-CM | POA: Diagnosis not present

## 2022-05-27 DIAGNOSIS — I10 Essential (primary) hypertension: Secondary | ICD-10-CM | POA: Diagnosis not present

## 2022-05-27 DIAGNOSIS — N182 Chronic kidney disease, stage 2 (mild): Secondary | ICD-10-CM | POA: Diagnosis not present

## 2022-05-28 DIAGNOSIS — H25813 Combined forms of age-related cataract, bilateral: Secondary | ICD-10-CM | POA: Diagnosis not present

## 2022-05-28 DIAGNOSIS — H524 Presbyopia: Secondary | ICD-10-CM | POA: Diagnosis not present

## 2022-06-04 DIAGNOSIS — F4323 Adjustment disorder with mixed anxiety and depressed mood: Secondary | ICD-10-CM | POA: Diagnosis not present

## 2022-06-08 DIAGNOSIS — I480 Paroxysmal atrial fibrillation: Secondary | ICD-10-CM | POA: Diagnosis not present

## 2022-06-09 DIAGNOSIS — F4322 Adjustment disorder with anxiety: Secondary | ICD-10-CM | POA: Diagnosis not present

## 2022-06-17 DIAGNOSIS — H353131 Nonexudative age-related macular degeneration, bilateral, early dry stage: Secondary | ICD-10-CM | POA: Diagnosis not present

## 2022-06-17 DIAGNOSIS — H2511 Age-related nuclear cataract, right eye: Secondary | ICD-10-CM | POA: Diagnosis not present

## 2022-06-19 DIAGNOSIS — F4322 Adjustment disorder with anxiety: Secondary | ICD-10-CM | POA: Diagnosis not present

## 2022-06-19 DIAGNOSIS — F331 Major depressive disorder, recurrent, moderate: Secondary | ICD-10-CM | POA: Diagnosis not present

## 2022-06-19 DIAGNOSIS — F3173 Bipolar disorder, in partial remission, most recent episode manic: Secondary | ICD-10-CM | POA: Diagnosis not present

## 2022-06-19 DIAGNOSIS — F411 Generalized anxiety disorder: Secondary | ICD-10-CM | POA: Diagnosis not present

## 2022-06-25 DIAGNOSIS — F4322 Adjustment disorder with anxiety: Secondary | ICD-10-CM | POA: Diagnosis not present

## 2022-07-02 DIAGNOSIS — N1831 Chronic kidney disease, stage 3a: Secondary | ICD-10-CM | POA: Diagnosis not present

## 2022-07-02 DIAGNOSIS — I428 Other cardiomyopathies: Secondary | ICD-10-CM | POA: Diagnosis not present

## 2022-07-02 DIAGNOSIS — Z85828 Personal history of other malignant neoplasm of skin: Secondary | ICD-10-CM | POA: Diagnosis not present

## 2022-07-02 DIAGNOSIS — I10 Essential (primary) hypertension: Secondary | ICD-10-CM | POA: Diagnosis not present

## 2022-07-02 DIAGNOSIS — Z1211 Encounter for screening for malignant neoplasm of colon: Secondary | ICD-10-CM | POA: Diagnosis not present

## 2022-07-02 DIAGNOSIS — I2510 Atherosclerotic heart disease of native coronary artery without angina pectoris: Secondary | ICD-10-CM | POA: Diagnosis not present

## 2022-07-02 DIAGNOSIS — Z0000 Encounter for general adult medical examination without abnormal findings: Secondary | ICD-10-CM | POA: Diagnosis not present

## 2022-07-02 DIAGNOSIS — M109 Gout, unspecified: Secondary | ICD-10-CM | POA: Diagnosis not present

## 2022-07-02 DIAGNOSIS — R258 Other abnormal involuntary movements: Secondary | ICD-10-CM | POA: Diagnosis not present

## 2022-07-02 DIAGNOSIS — Z1212 Encounter for screening for malignant neoplasm of rectum: Secondary | ICD-10-CM | POA: Diagnosis not present

## 2022-07-02 DIAGNOSIS — D696 Thrombocytopenia, unspecified: Secondary | ICD-10-CM | POA: Diagnosis not present

## 2022-07-02 DIAGNOSIS — Z1330 Encounter for screening examination for mental health and behavioral disorders, unspecified: Secondary | ICD-10-CM | POA: Diagnosis not present

## 2022-07-06 DIAGNOSIS — I480 Paroxysmal atrial fibrillation: Secondary | ICD-10-CM | POA: Diagnosis not present

## 2022-07-06 DIAGNOSIS — I10 Essential (primary) hypertension: Secondary | ICD-10-CM | POA: Diagnosis not present

## 2022-07-06 DIAGNOSIS — G4733 Obstructive sleep apnea (adult) (pediatric): Secondary | ICD-10-CM | POA: Diagnosis not present

## 2022-07-06 DIAGNOSIS — I428 Other cardiomyopathies: Secondary | ICD-10-CM | POA: Diagnosis not present

## 2022-07-06 DIAGNOSIS — F331 Major depressive disorder, recurrent, moderate: Secondary | ICD-10-CM | POA: Diagnosis not present

## 2022-07-07 DIAGNOSIS — B356 Tinea cruris: Secondary | ICD-10-CM | POA: Diagnosis not present

## 2022-07-07 DIAGNOSIS — F4322 Adjustment disorder with anxiety: Secondary | ICD-10-CM | POA: Diagnosis not present

## 2022-07-07 DIAGNOSIS — K5903 Drug induced constipation: Secondary | ICD-10-CM | POA: Diagnosis not present

## 2022-07-14 DIAGNOSIS — F4322 Adjustment disorder with anxiety: Secondary | ICD-10-CM | POA: Diagnosis not present

## 2022-07-16 DIAGNOSIS — F411 Generalized anxiety disorder: Secondary | ICD-10-CM | POA: Diagnosis not present

## 2022-07-16 DIAGNOSIS — F3173 Bipolar disorder, in partial remission, most recent episode manic: Secondary | ICD-10-CM | POA: Diagnosis not present

## 2022-07-17 DIAGNOSIS — Z01818 Encounter for other preprocedural examination: Secondary | ICD-10-CM | POA: Diagnosis not present

## 2022-07-17 DIAGNOSIS — H2511 Age-related nuclear cataract, right eye: Secondary | ICD-10-CM | POA: Diagnosis not present

## 2022-07-21 DIAGNOSIS — F4322 Adjustment disorder with anxiety: Secondary | ICD-10-CM | POA: Diagnosis not present

## 2022-07-29 DIAGNOSIS — F4322 Adjustment disorder with anxiety: Secondary | ICD-10-CM | POA: Diagnosis not present

## 2022-07-31 DIAGNOSIS — H2511 Age-related nuclear cataract, right eye: Secondary | ICD-10-CM | POA: Diagnosis not present

## 2022-08-04 DIAGNOSIS — F4322 Adjustment disorder with anxiety: Secondary | ICD-10-CM | POA: Diagnosis not present

## 2022-08-11 DIAGNOSIS — F4322 Adjustment disorder with anxiety: Secondary | ICD-10-CM | POA: Diagnosis not present

## 2022-08-13 DIAGNOSIS — F411 Generalized anxiety disorder: Secondary | ICD-10-CM | POA: Diagnosis not present

## 2022-08-13 DIAGNOSIS — F3173 Bipolar disorder, in partial remission, most recent episode manic: Secondary | ICD-10-CM | POA: Diagnosis not present

## 2022-08-14 DIAGNOSIS — H2512 Age-related nuclear cataract, left eye: Secondary | ICD-10-CM | POA: Diagnosis not present

## 2022-08-18 DIAGNOSIS — F4322 Adjustment disorder with anxiety: Secondary | ICD-10-CM | POA: Diagnosis not present

## 2022-09-01 DIAGNOSIS — F4322 Adjustment disorder with anxiety: Secondary | ICD-10-CM | POA: Diagnosis not present

## 2022-09-08 DIAGNOSIS — F4322 Adjustment disorder with anxiety: Secondary | ICD-10-CM | POA: Diagnosis not present

## 2022-09-09 DIAGNOSIS — F3173 Bipolar disorder, in partial remission, most recent episode manic: Secondary | ICD-10-CM | POA: Diagnosis not present

## 2022-09-09 DIAGNOSIS — F411 Generalized anxiety disorder: Secondary | ICD-10-CM | POA: Diagnosis not present

## 2022-09-11 DIAGNOSIS — Z86010 Personal history of colonic polyps: Secondary | ICD-10-CM | POA: Diagnosis not present

## 2022-09-11 DIAGNOSIS — F419 Anxiety disorder, unspecified: Secondary | ICD-10-CM | POA: Diagnosis not present

## 2022-09-11 DIAGNOSIS — Z1211 Encounter for screening for malignant neoplasm of colon: Secondary | ICD-10-CM | POA: Diagnosis not present

## 2022-09-11 DIAGNOSIS — D124 Benign neoplasm of descending colon: Secondary | ICD-10-CM | POA: Diagnosis not present

## 2022-09-11 DIAGNOSIS — D123 Benign neoplasm of transverse colon: Secondary | ICD-10-CM | POA: Diagnosis not present

## 2022-09-14 DIAGNOSIS — F4322 Adjustment disorder with anxiety: Secondary | ICD-10-CM | POA: Diagnosis not present

## 2022-09-23 DIAGNOSIS — F3173 Bipolar disorder, in partial remission, most recent episode manic: Secondary | ICD-10-CM | POA: Diagnosis not present

## 2022-09-23 DIAGNOSIS — F411 Generalized anxiety disorder: Secondary | ICD-10-CM | POA: Diagnosis not present

## 2022-09-23 DIAGNOSIS — F4322 Adjustment disorder with anxiety: Secondary | ICD-10-CM | POA: Diagnosis not present

## 2022-10-05 DIAGNOSIS — F4322 Adjustment disorder with anxiety: Secondary | ICD-10-CM | POA: Diagnosis not present

## 2022-10-12 DIAGNOSIS — F4322 Adjustment disorder with anxiety: Secondary | ICD-10-CM | POA: Diagnosis not present

## 2022-10-15 DIAGNOSIS — F411 Generalized anxiety disorder: Secondary | ICD-10-CM | POA: Diagnosis not present

## 2022-10-15 DIAGNOSIS — F331 Major depressive disorder, recurrent, moderate: Secondary | ICD-10-CM | POA: Diagnosis not present

## 2022-10-19 DIAGNOSIS — F4322 Adjustment disorder with anxiety: Secondary | ICD-10-CM | POA: Diagnosis not present

## 2022-10-28 DIAGNOSIS — F4322 Adjustment disorder with anxiety: Secondary | ICD-10-CM | POA: Diagnosis not present

## 2022-11-02 DIAGNOSIS — F4322 Adjustment disorder with anxiety: Secondary | ICD-10-CM | POA: Diagnosis not present

## 2022-11-09 DIAGNOSIS — F4322 Adjustment disorder with anxiety: Secondary | ICD-10-CM | POA: Diagnosis not present

## 2022-11-10 DIAGNOSIS — R29898 Other symptoms and signs involving the musculoskeletal system: Secondary | ICD-10-CM | POA: Diagnosis not present

## 2022-11-10 DIAGNOSIS — R258 Other abnormal involuntary movements: Secondary | ICD-10-CM | POA: Diagnosis not present

## 2022-11-10 DIAGNOSIS — G20C Parkinsonism, unspecified: Secondary | ICD-10-CM | POA: Diagnosis not present

## 2022-11-10 DIAGNOSIS — F411 Generalized anxiety disorder: Secondary | ICD-10-CM | POA: Diagnosis not present

## 2022-11-10 DIAGNOSIS — R29818 Other symptoms and signs involving the nervous system: Secondary | ICD-10-CM | POA: Diagnosis not present

## 2022-11-10 DIAGNOSIS — G2119 Other drug induced secondary parkinsonism: Secondary | ICD-10-CM | POA: Diagnosis not present

## 2022-11-10 DIAGNOSIS — F331 Major depressive disorder, recurrent, moderate: Secondary | ICD-10-CM | POA: Diagnosis not present

## 2022-11-10 DIAGNOSIS — F3173 Bipolar disorder, in partial remission, most recent episode manic: Secondary | ICD-10-CM | POA: Diagnosis not present

## 2022-11-10 DIAGNOSIS — I10 Essential (primary) hypertension: Secondary | ICD-10-CM | POA: Diagnosis not present

## 2022-11-16 DIAGNOSIS — F4322 Adjustment disorder with anxiety: Secondary | ICD-10-CM | POA: Diagnosis not present

## 2022-11-17 DIAGNOSIS — Z23 Encounter for immunization: Secondary | ICD-10-CM | POA: Diagnosis not present

## 2022-11-30 DIAGNOSIS — F4322 Adjustment disorder with anxiety: Secondary | ICD-10-CM | POA: Diagnosis not present

## 2022-12-07 DIAGNOSIS — F4322 Adjustment disorder with anxiety: Secondary | ICD-10-CM | POA: Diagnosis not present

## 2022-12-11 DIAGNOSIS — F3173 Bipolar disorder, in partial remission, most recent episode manic: Secondary | ICD-10-CM | POA: Diagnosis not present

## 2022-12-11 DIAGNOSIS — F331 Major depressive disorder, recurrent, moderate: Secondary | ICD-10-CM | POA: Diagnosis not present

## 2022-12-11 DIAGNOSIS — F411 Generalized anxiety disorder: Secondary | ICD-10-CM | POA: Diagnosis not present

## 2022-12-21 DIAGNOSIS — F4322 Adjustment disorder with anxiety: Secondary | ICD-10-CM | POA: Diagnosis not present

## 2022-12-28 DIAGNOSIS — Z8673 Personal history of transient ischemic attack (TIA), and cerebral infarction without residual deficits: Secondary | ICD-10-CM | POA: Diagnosis not present

## 2022-12-28 DIAGNOSIS — R258 Other abnormal involuntary movements: Secondary | ICD-10-CM | POA: Diagnosis not present

## 2022-12-31 DIAGNOSIS — D696 Thrombocytopenia, unspecified: Secondary | ICD-10-CM | POA: Diagnosis not present

## 2022-12-31 DIAGNOSIS — G2119 Other drug induced secondary parkinsonism: Secondary | ICD-10-CM | POA: Diagnosis not present

## 2022-12-31 DIAGNOSIS — F419 Anxiety disorder, unspecified: Secondary | ICD-10-CM | POA: Diagnosis not present

## 2022-12-31 DIAGNOSIS — R8290 Unspecified abnormal findings in urine: Secondary | ICD-10-CM | POA: Diagnosis not present

## 2022-12-31 DIAGNOSIS — I10 Essential (primary) hypertension: Secondary | ICD-10-CM | POA: Diagnosis not present

## 2022-12-31 DIAGNOSIS — N1831 Chronic kidney disease, stage 3a: Secondary | ICD-10-CM | POA: Diagnosis not present

## 2022-12-31 DIAGNOSIS — F331 Major depressive disorder, recurrent, moderate: Secondary | ICD-10-CM | POA: Diagnosis not present

## 2023-01-04 DIAGNOSIS — F4322 Adjustment disorder with anxiety: Secondary | ICD-10-CM | POA: Diagnosis not present

## 2023-01-11 DIAGNOSIS — F411 Generalized anxiety disorder: Secondary | ICD-10-CM | POA: Diagnosis not present

## 2023-01-11 DIAGNOSIS — F3173 Bipolar disorder, in partial remission, most recent episode manic: Secondary | ICD-10-CM | POA: Diagnosis not present

## 2023-01-11 DIAGNOSIS — F331 Major depressive disorder, recurrent, moderate: Secondary | ICD-10-CM | POA: Diagnosis not present

## 2023-01-12 DIAGNOSIS — L814 Other melanin hyperpigmentation: Secondary | ICD-10-CM | POA: Diagnosis not present

## 2023-01-12 DIAGNOSIS — Z85828 Personal history of other malignant neoplasm of skin: Secondary | ICD-10-CM | POA: Diagnosis not present

## 2023-01-12 DIAGNOSIS — L918 Other hypertrophic disorders of the skin: Secondary | ICD-10-CM | POA: Diagnosis not present

## 2023-01-12 DIAGNOSIS — Z08 Encounter for follow-up examination after completed treatment for malignant neoplasm: Secondary | ICD-10-CM | POA: Diagnosis not present

## 2023-01-12 DIAGNOSIS — L821 Other seborrheic keratosis: Secondary | ICD-10-CM | POA: Diagnosis not present

## 2023-01-12 DIAGNOSIS — D485 Neoplasm of uncertain behavior of skin: Secondary | ICD-10-CM | POA: Diagnosis not present

## 2023-01-12 DIAGNOSIS — D225 Melanocytic nevi of trunk: Secondary | ICD-10-CM | POA: Diagnosis not present

## 2023-01-12 DIAGNOSIS — L570 Actinic keratosis: Secondary | ICD-10-CM | POA: Diagnosis not present

## 2023-01-18 DIAGNOSIS — I10 Essential (primary) hypertension: Secondary | ICD-10-CM | POA: Diagnosis not present

## 2023-01-18 DIAGNOSIS — G4733 Obstructive sleep apnea (adult) (pediatric): Secondary | ICD-10-CM | POA: Diagnosis not present

## 2023-01-18 DIAGNOSIS — F4322 Adjustment disorder with anxiety: Secondary | ICD-10-CM | POA: Diagnosis not present

## 2023-02-01 DIAGNOSIS — F4322 Adjustment disorder with anxiety: Secondary | ICD-10-CM | POA: Diagnosis not present

## 2023-02-09 DIAGNOSIS — F3173 Bipolar disorder, in partial remission, most recent episode manic: Secondary | ICD-10-CM | POA: Diagnosis not present

## 2023-02-09 DIAGNOSIS — G2119 Other drug induced secondary parkinsonism: Secondary | ICD-10-CM | POA: Diagnosis not present

## 2023-02-09 DIAGNOSIS — F331 Major depressive disorder, recurrent, moderate: Secondary | ICD-10-CM | POA: Diagnosis not present

## 2023-02-09 DIAGNOSIS — F411 Generalized anxiety disorder: Secondary | ICD-10-CM | POA: Diagnosis not present

## 2023-02-09 DIAGNOSIS — I10 Essential (primary) hypertension: Secondary | ICD-10-CM | POA: Diagnosis not present

## 2023-02-09 DIAGNOSIS — G20C Parkinsonism, unspecified: Secondary | ICD-10-CM | POA: Diagnosis not present

## 2023-05-25 IMAGING — CT CT HEART MORPH/PULM VEIN W/ CM & W/O CA SCORE
1 series · 12 of 20 positions shown, 15 images · non-contrast
Comparison: Coronary CTA CT of 01/12/2018.
COMPARISON: Coronary CTA CT of 01/12/2018.

Addendum:
EXAM:
OVER-READ INTERPRETATION  CT CHEST

The following report is an over-read performed by radiologist Dr.
Maikel Wilkens [[email or web address]] on 10/07/2020. This over-read
does not include interpretation of cardiac or coronary anatomy or
pathology. The coronary CTA interpretation by the cardiologist is
attached.
CLINICAL DATA: Atrial fibrillation scheduled for ablation.
Cardiac CTA
TECHNIQUE: A non-contrast, gated CT scan was obtained with axial slices of 3 mm
through the heart for calcium scoring. Calcium scoring was performed
using the Agatston method. A 120 kV prospective, gated, contrast
cardiac scan was obtained. Gantry rotation speed was 250 msecs and
collimation was 0.6 mm. Nitroglycerin was not given. A delayed scan
was obtained to exclude left atrial appendage thrombus. The 3D
dataset was reconstructed in 5% intervals of the 25-50% of the R-R
cycle. Late systolic phases were analyzed on a dedicated workstation
using MPR, MIP, and VRT modes. The patient received 80 cc of
contrast.

[Series 1551: findings · 0.68mm/px · 12 of 20 slices shown, 15 images]
[im 2/20  vessel]
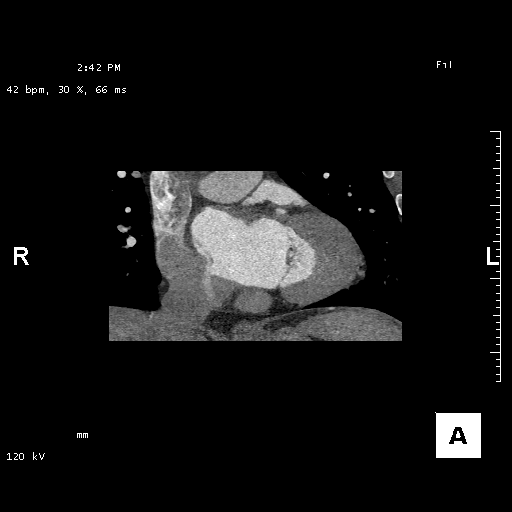
[im 2/20  lung]
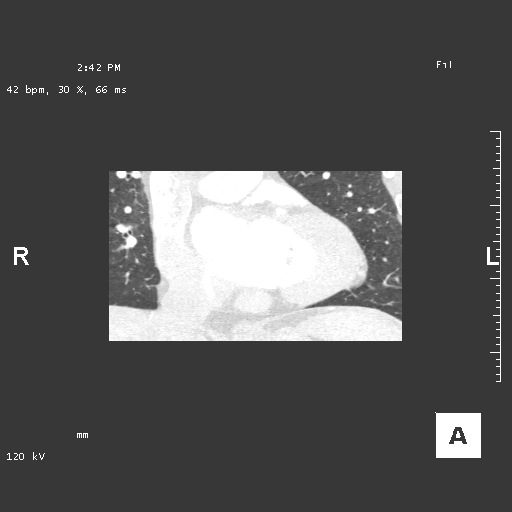
[im 4/20  vessel]
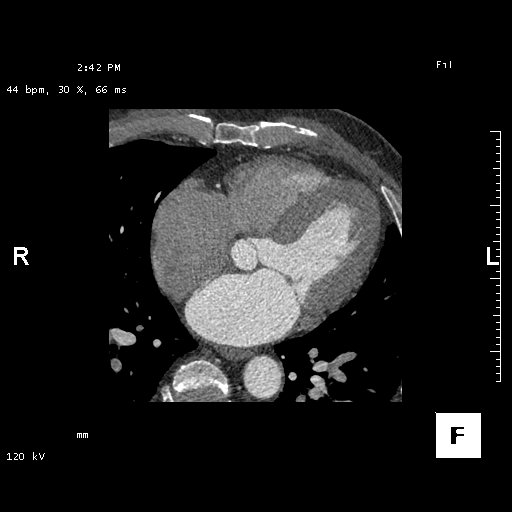
[im 5/20  vessel]
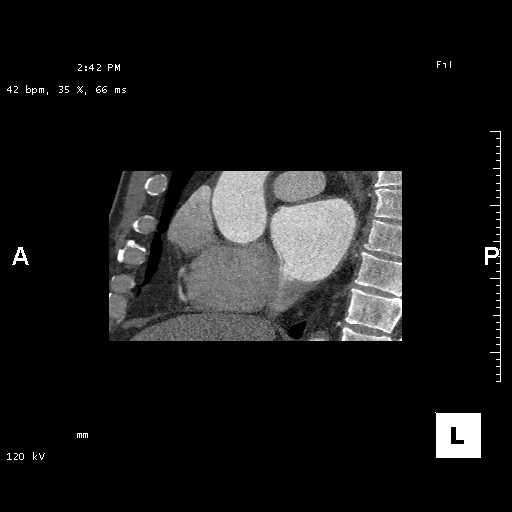
[im 7/20  vessel]
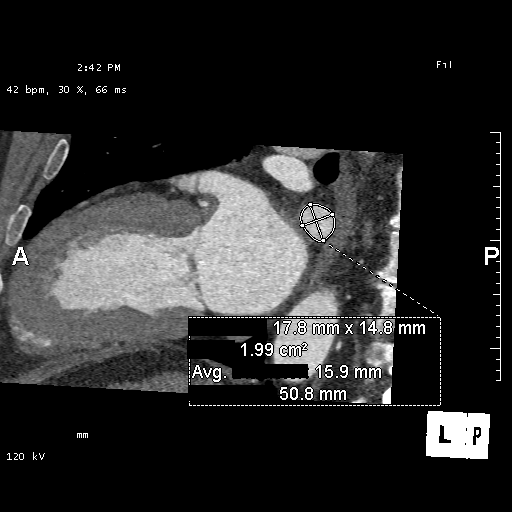
[im 8/20  vessel]
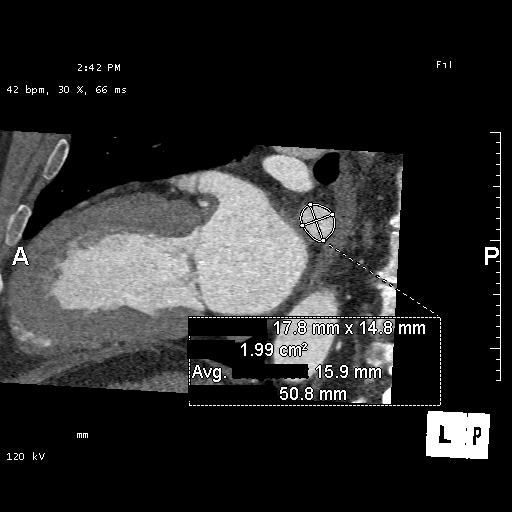
[im 8/20  lung]
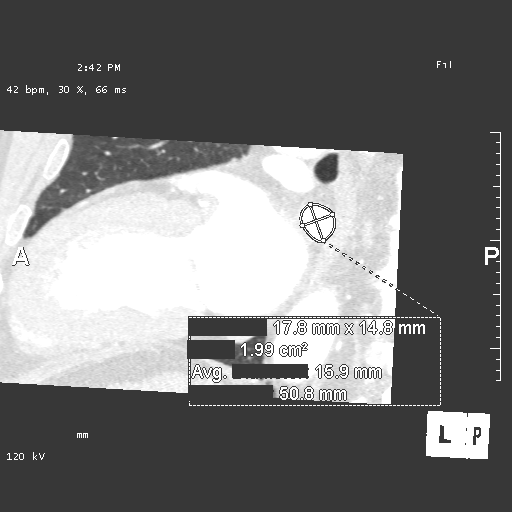
[im 10/20  vessel]
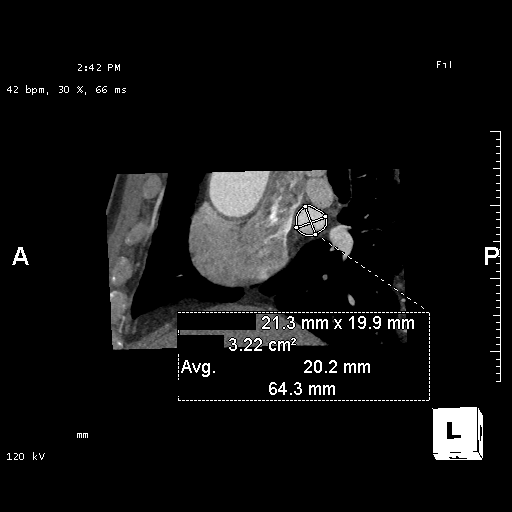
[im 11/20  vessel]
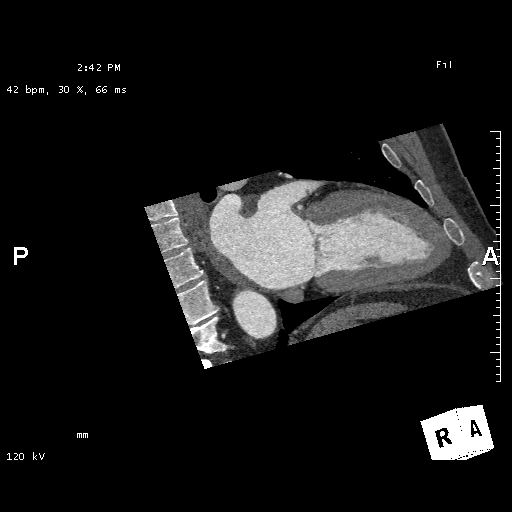
[im 13/20  vessel]
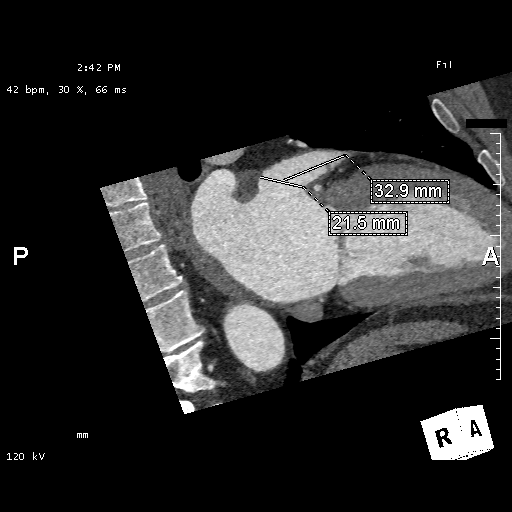
[im 14/20  vessel]
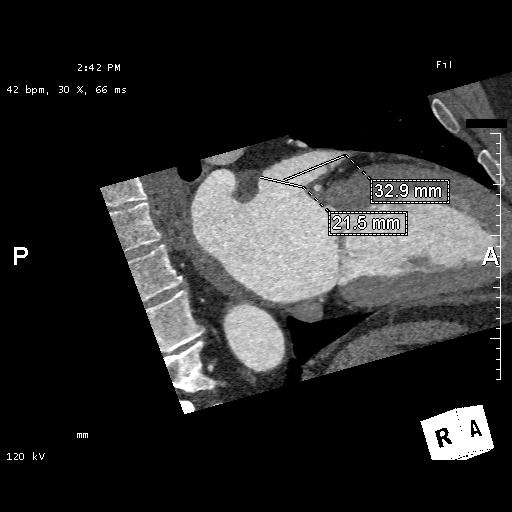
[im 14/20  lung]
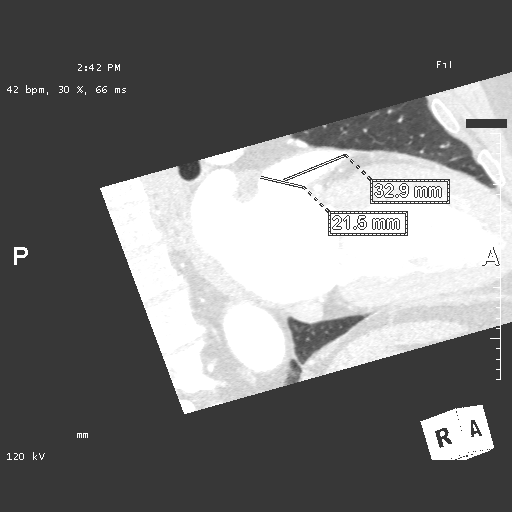
[im 16/20  vessel]
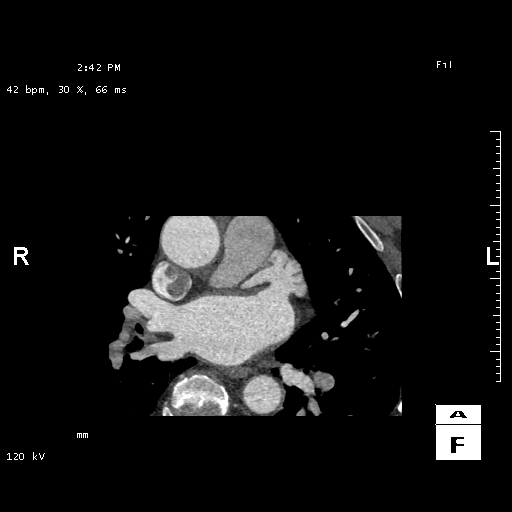
[im 17/20  vessel]
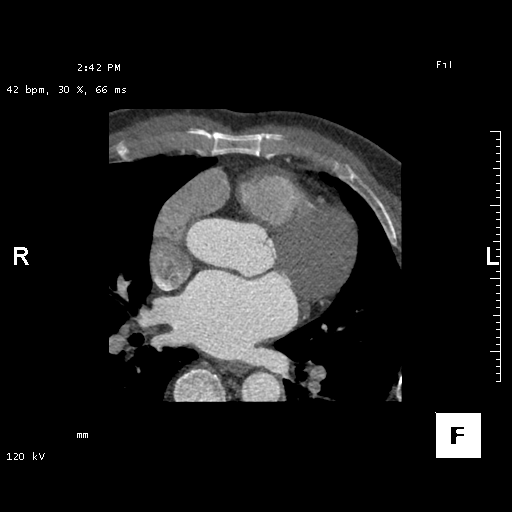
[im 19/20  vessel]
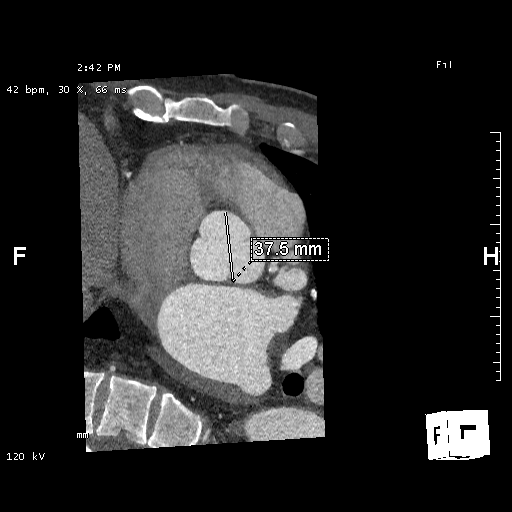

[12 of 20 positions shown; findings below may reference images not displayed]

FINDINGS: Vascular: Aortic atherosclerosis. Upper normal ascending aortic
caliber, 3.9 cm. No central pulmonary embolism, on this
non-dedicated study.

Mediastinum/Nodes: No imaged thoracic adenopathy.

Lungs/Pleura: No pleural fluid.

Right lower lobe pulmonary nodule of 3 mm on [DATE], not visualized on
the prior.

Calcified granuloma just cephalad to the left hemidiaphragm on 50/8.

Upper Abdomen: Normal imaged portions of the liver, spleen.

Musculoskeletal: No acute osseous abnormality.
IMPRESSION: 1.  No acute findings in the imaged extracardiac chest.
2. Right lower lobe 3 mm pulmonary nodule. No follow-up needed if
patient is low-risk. Non-contrast chest CT can be considered in 12
months if patient is high-risk. This recommendation follows the
consensus statement: Guidelines for Management of Incidental
Pulmonary Nodules Detected on CT Images: From the [HOSPITAL]
3.  Aortic Atherosclerosis (COFAX-QSH.H).
FINDINGS: Image quality: Excellent.

Noise artifact is: Limited.

Pulmonary Veins: There is normal pulmonary vein drainage into the
left atrium (2 on the right and 2 on the left) with ostial
measurements as follows:

RUPV: Ostium 21 mm x 20 mm  area 322 mm2

RLPV:  Ostium 19 mm x 17 mm  area 251 mm2

LUPV:  Ostium 18 mm x 14 mm area 209 mm2

LLPV:  Ostium 18 mm x 15 mm  area 199 mm2

Left Atrium: The left atrial size is dilated. There is a moderate
size PFO with evidence of L to R shunting. The left atrial appendage
is large broccoli type with two lobes and ostial size 23.8 x 21.0 mm
and length 32.9 mm. There is no thrombus in the left atrial
appendage on contrast or delayed imaging. The esophagus runs in
close proximity to the left inferior pulmonary vein ostium.

Coronary Arteries: CAC score of 0. Normal coronary origin. Right
dominance. The study was performed without use of NTG and is
insufficient for plaque evaluation.

Right Atrium: Right atrial size is dilated.

Right Ventricle: The right ventricular cavity is within normal
limits.

Left Ventricle: The ventricular cavity size is within normal limits.
There are no stigmata of prior infarction. There is no abnormal
filling defect.

Pericardium: Normal thickness with no significant effusion or
calcium present.

Pulmonary Artery: Normal caliber without proximal filling defect.

Cardiac valves: The aortic valve is trileaflet without significant
calcification. The mitral valve is normal structure without
significant calcification.

Aorta: Normal caliber with no significant disease.

Extra-cardiac findings: See attached radiology report for
non-cardiac structures.
IMPRESSION: 1. There is normal pulmonary vein drainage into the left atrium with
ostial measurements above.

2. There is no thrombus in the left atrial appendage.

3. The esophagus runs in close proximity to the left inferior
pulmonary vein ostium.

4. There is a moderate size PFO with evidence of L to R shunting.

5. Normal coronary origin. Right dominance.

6. CAC score of 0.

*** End of Addendum ***
EXAM:
OVER-READ INTERPRETATION  CT CHEST

The following report is an over-read performed by radiologist Dr.
Maikel Wilkens [[email or web address]] on 10/07/2020. This over-read
does not include interpretation of cardiac or coronary anatomy or
pathology. The coronary CTA interpretation by the cardiologist is
attached.
FINDINGS: Vascular: Aortic atherosclerosis. Upper normal ascending aortic
caliber, 3.9 cm. No central pulmonary embolism, on this
non-dedicated study.

Mediastinum/Nodes: No imaged thoracic adenopathy.

Lungs/Pleura: No pleural fluid.

Right lower lobe pulmonary nodule of 3 mm on [DATE], not visualized on
the prior.

Calcified granuloma just cephalad to the left hemidiaphragm on 50/8.

Upper Abdomen: Normal imaged portions of the liver, spleen.

Musculoskeletal: No acute osseous abnormality.
IMPRESSION: 1.  No acute findings in the imaged extracardiac chest.
2. Right lower lobe 3 mm pulmonary nodule. No follow-up needed if
patient is low-risk. Non-contrast chest CT can be considered in 12
months if patient is high-risk. This recommendation follows the
consensus statement: Guidelines for Management of Incidental
Pulmonary Nodules Detected on CT Images: From the [HOSPITAL]
3.  Aortic Atherosclerosis (COFAX-QSH.H).

## 2023-12-28 ENCOUNTER — Ambulatory Visit: Attending: Cardiology | Admitting: Cardiology

## 2023-12-28 VITALS — BP 122/64 | HR 59 | Ht 70.0 in | Wt 248.0 lb

## 2023-12-28 DIAGNOSIS — I420 Dilated cardiomyopathy: Secondary | ICD-10-CM | POA: Insufficient documentation

## 2023-12-28 DIAGNOSIS — R600 Localized edema: Secondary | ICD-10-CM | POA: Diagnosis present

## 2023-12-28 DIAGNOSIS — I10 Essential (primary) hypertension: Secondary | ICD-10-CM | POA: Diagnosis present

## 2023-12-28 DIAGNOSIS — I480 Paroxysmal atrial fibrillation: Secondary | ICD-10-CM | POA: Diagnosis not present

## 2023-12-28 DIAGNOSIS — I2510 Atherosclerotic heart disease of native coronary artery without angina pectoris: Secondary | ICD-10-CM | POA: Diagnosis present

## 2023-12-28 DIAGNOSIS — E669 Obesity, unspecified: Secondary | ICD-10-CM | POA: Insufficient documentation

## 2023-12-28 NOTE — Patient Instructions (Addendum)
 Medication Instructions:   No changes   *If you need a refill on your cardiac medications before your next appointment, please call your pharmacy*   Lab Work: Not needed    Testing/Procedures: Not needed   Follow-Up: At [facility], you and your health needs are our priority.  As part of our continuing mission to provide you with exceptional heart care, we have created designated Provider Care Teams.  These Care Teams include your primary Cardiologist (physician) and Advanced Practice Providers (APPs -  Physician Assistants and Nurse Practitioners) who all work together to provide you with the care you need, when you need it.     Your next appointment:   6 month(s)  The format for your next appointment:   In Person  Provider:   Alm Clay, MD or Damien Braver, NP or Katlyn West, NP

## 2023-12-28 NOTE — Progress Notes (Unsigned)
 Cardiology Office Note:  .   Date:  12/31/2023  ID:  Bruce Yoder, DOB 04-Dec-1945, MRN [id number] PCP: Roseann Coad, MD (Inactive)  [facility] Providers Cardiologist:  Alm Clay, MD Electrophysiologist:  Will Gladis Norton, MD     Chief Complaint  Patient presents with   Follow-up    Reestablish cardiology care.  Seen by Dr. Norton January 15, 2021.     Atrial Fibrillation    Had ablation in 2019 and recurrence of A-fib in 2023 and 2024 requiring cardioversion.  Briefly seen by cardiologist in [place]  for A-fib and then by [facility] cardiology in [facility] for cardioversion.     Patient Profile: .     Bruce Yoder is a moderately obese 78 y.o. male with a PMH notable for PAF (on Toprol  100 mg daily and Xarelto  20 mg daily), HTN, HLD who presents here to reestablish cardiology care here in [place] (last seen in November 2022) at the request of No ref. provider found.  PMH: Persistent A-fib (diagnosed 2019) => s/p PVI August 2022 Recurrent A-fib with cardioversion March 2025 CAD-moderate LCx disease-not FFR positive despite CT scan suggestion (2019) Obesity => interested in Zepbound OSA HTN HLD CKD 3 => unilateral kidney     I last saw Bruce Yoder in May 2022 when I scheduled him for facilitated cardioversion heavy loaded with amiodarone .  Referred to EP for cardioversion.  While in A-fib his EF dropped down to 35 to 40%.  Returned to normal after restoring sinus rhythm.  He was last seen by Dr. Norton in November 2022 (prior to the patient moving out of state).  This was a 55-month follow-up after his ablation and he had not any further episodes of A-fib.  Had done well.  No longer on amiodarone .  Ambulatory without restriction.  He followed up with Dr. Slater Door in [place].  He went to the ER in October 2023 after his Apple watch suggested that he is in A-fib.  He moved back to [place]  in 2024 and was seen by Dr. Chesley Rima from  [facility].  He was seen on May 08, 2022 indicated that he had had episode of A-fib probably related to an episode of a mishap with his mental health medications any had a rapid heart rate spells with ranges from the 120s to 140s.  He indicated he did not miss any doses of Xarelto .  He was noted to be in A-fib RVR at the time and was scheduled for cardioversion on March 2024.  He had an echo done in April but never followed back up with [facility] cardiology  Subjective  Discussed the use of AI scribe software for clinical note transcription with the patient, who gave verbal consent to proceed.  History of Present Illness Bruce Yoder is a 78 year old male with atrial fibrillation who presents for follow-up regarding his cardiac health.  He experienced episodes of his heart going out of rhythm while living in [place] , which resolved spontaneously. A significant episode of atrial fibrillation occurred in April 2024, associated with depression and anxiety, leading to a cardioversion in [facility]. He previously underwent a cardiac ablation in August 2022. Since the cardioversion, he believes his heart has remained in rhythm. He does not experience irregular heartbeats or palpitations but notes feeling out of breath when walking in the past.  He is currently taking Xarelto , metoprolol  100 mg, losartan  50 mg, Lipitor 40 mg, Effexor  150 mg, and  allopurinol  for gout. He previously took Lasix  but has not used it recently. He mentions a history of swelling in his legs, noticeable when removing his socks at night. He also has a history of sleep apnea managed with CPAP.  He experienced a serious problem with depression and anxiety a couple of years ago, coinciding with his heart going out of rhythm. He took an overdose of Seroquel during that time, which he believes contributed to his arrhythmia. He is no longer taking Risperdal  but uses doxepin  for sleep.  He has a history  of elevated cholesterol, with a total cholesterol of 97, HDL of 33, LDL of 38, triglycerides of 125, and an A1c of 5.4 as of September 2025. No chest pain, pressure, or tightness, and no dizziness, lightheadedness, or syncope. He has a persistent cough following a recent cold and has a tendency to get bronchitis, but does not believe it is bacterial.  Cardiovascular ROS: no chest pain or dyspnea on exertion positive for - irregular heartbeat, palpitations, and but does not feel like he has had another breakthrough spell of A-fib.  He has stable mild end of day swelling but no significant edema. negative for - loss of consciousness, orthopnea, paroxysmal nocturnal dyspnea, shortness of breath, or lightheadedness, dizziness or wooziness, syncope or near syncope, TIA or CVA or amaurosis fugax.  Claudication.  Melena, hematochezia, hematuria, epistaxis.  ROS:  Review of Systems - per HPI-     Objective  Current Meds  Medication Sig   acetaminophen  (TYLENOL ) 500 MG tablet Take 1,000 mg by mouth every 6 (six) hours as needed for moderate pain.   allopurinol  (ZYLOPRIM ) 300 MG tablet Take 150 mg by mouth daily.    atorvastatin  (LIPITOR) 40 MG tablet Take 1 tablet (40 mg total) by mouth daily.   b complex-vitamin c-folic acid  (NEPHRO-VITE) 0.8 MG TABS tablet Take 1 tablet by mouth 2 (two) times daily.   doxepin  (SINEQUAN ) 75 MG capsule Take 1 capsule (75 mg total) by mouth at bedtime.   losartan  (COZAAR ) 50 MG tablet Take 1 tablet (50 mg total) by mouth daily.   metoprolol  succinate (TOPROL -XL) 100 MG 24 hr tablet TAKE 1 TABLET BY MOUTH ONCE DAILY WITH MEALS   venlafaxine  XR (EFFEXOR -XR) 150 MG 24 hr capsule Take 2 capsules (300 mg total) by mouth daily after breakfast.   XARELTO  20 MG TABS tablet TAKE 1 TABLET DAILY WITH SUPPER. (Patient taking differently: Take 20 mg by mouth daily with supper.)   Studies Reviewed: SABRA   EKG Interpretation Date/Time:  Tuesday December 28 2023 11:35:54  EST Ventricular Rate:  59 PR Interval:  200 QRS Duration:  104 QT Interval:  416 QTC Calculation: 411 R Axis:   -46  Text Interpretation: Sinus bradycardia Left axis deviation When compared with ECG of 02-Apr-2022 21:03, No significant change was found Confirmed by Anner Lenis ([id number]) on 12/31/2023 12:41:11 AM    Results LABS Total Cholesterol: 97 (11/04/2023) HDL: 33 (11/04/2023) LDL: 38 (11/04/2023) Triglycerides: 125 (11/04/2023) A1c: 5.4 (11/04/2023) Creatinine: 1.26 (11/04/2023) TSH: 1.68 (11/04/2023)   PROCEDURE Cardiac CATH: Right dominant system.  Proximal to mid LAD 25%.  OM2 55 to 60%.  FFR is 0.93.  Not significant.  Moderately elevated LVEDP.  (01/31/2018)   Afib Ablation (PVI): 10/11/2020 (Dr. [facility])  DIAGNOSTIC Coronary CTA: CAC score 0.  Cannot exclude severe LM/LCx disease.  CT FFR of OM1 0.7 and distal LCx 0.81.  Distal LAD FFR 0.86. Cardioversion: Performed At [facility] (05/13/2023)  Echocardiogram (  [facility]): Normal left ventricular function, ejection fraction 60-65%, no wall motion abnormalities, normal relaxation, normal atrial size, normal right atrial pressure, normal aortic and mitral valves (06/07/2020) Echocardiogram [facility]): Normal LV size with mildly increased thickness.  EF 55 to 60%.  No RWMA.  Normal diastolic parameters.  Moderate LA dilation.  Mildly elevated RVP.  Mild MR.  Mild to moderate TR.  No IAS shunt.   Risk Assessment/Calculations:    CHA2DS2-VASc Score = 4   This indicates a 4.8% annual risk of stroke. The patient's score is based upon: CHF History: 0 HTN History: 1 Diabetes History: 0 Stroke History: 0 Vascular Disease History: 1 Age Score: 2 Gender Score: 0          Physical Exam:   VS:  BP 122/64 (BP Location: Left Arm, Patient Position: Sitting, Cuff Size: Large)   Pulse (!) 59   Ht 5' 10 (1.778 m)   Wt 248 lb (112.5 kg)   SpO2 96%   BMI 35.58 kg/m    Wt  Readings from Last 3 Encounters:  12/28/23 248 lb (112.5 kg)  04/02/22 228 lb (103.4 kg)  01/15/21 236 lb 3.2 oz (107.1 kg)      GEN: Well nourished, well developed in no acute distress; moderately obese NECK: No JVD; No carotid bruits CARDIAC: Normal S1, S2; RRR, no murmurs, rubs, gallops RESPIRATORY:  Clear to auscultation without rales, wheezing or rhonchi ; nonlabored, good air movement. ABDOMEN: Soft, non-tender, non-distended EXTREMITIES:  No edema; No deformity     ASSESSMENT AND PLAN: .    Problem List Items Addressed This Visit       Cardiology Problems   Coronary artery disease, non-occlusive; without angina (Chronic)   Moderate, nonocclusive CAD by cath in 2023 with no active anginal symptoms. On atorvastatin  4 mg daily along with Toprol  100 mg and losartan  50 mg. On Xarelto , therefore not on aspirin .      Dilated cardiomyopathy (HCC) (Chronic)   Resolved with most recent echo showing EF 60 to 65%. Continue beta-blocker and ARB as noted for hypertension. No longer requiring any diuretic.      Essential hypertension (Chronic)   Blood pressure well-controlled. - Continue metoprolol  100 mg daily. - Continue losartan  50 mg daily.      Relevant Orders   EKG 12-Lead (Completed)   Paroxysmal (Persistent) Atrial Fibrillation [facility]): CHA2DS2-VASc Score 4 (Age, CAD, HTN, CHF) - Primary (Chronic)   Paroxysmal but likely persistent paroxysmal A-fib with ablation in 2019 but at least 2 documented breakthrough spells requiring cardioversion in 2023 and 2024. He usually is aware of his breakthrough spells and has not had any recently.  Not since 2024.  Currently in normal sinus rhythm with normal echocardiogram findings. - Continue Xarelto  20 mg daily. - Continue metoprolol  100 mg daily. - Continue losartan  50 mg daily. - Follow up with electrophysiologist (Dr. Inocencio) next week.        Other   Bilateral lower extremity edema   Chronic edema without significant  symptoms. - Continue to monitor edema and manage conservatively with foot elevation and support stockings for long periods of sitting or standing/travel      Moderate obesity (Chronic)   He is definitely interested in weight loss with pending insurance coverage for Zepbound. - Coordinated with clinical pharmacy team to assist with insurance coverage for Zepbound.             Follow-Up: Return in about 6 months (around 06/26/2024) for [employer]  Office (MD or APP).  I spent 53 minutes in the care of Bruce Yoder today including reviewing outside labs from PCP and [facility] via [facility] and Care Everywhere (2 minutes), reviewing studies (previous studies here including echocardiograms x 2, cath and Coronary CTA reviewed as well as [facility] Echocardiogram from last year report reviewed = 12 minutes), reviewing outside studies (included 12 minutes listed), face to face time discussing treatment options (18 minutes), reviewing records from old notes from both myself and Dr. Inocencio as well as A-fib clinic, notes from [place]  cardiology and from [facility] cardiology (10 minutes), 11 minutes dictating, and documenting in the encounter.      Signed, Alm MICAEL Clay, MD, MS Alm Clay, M.D., M.S. Interventional Cardiologist  [facility] Pager # [phone]

## 2023-12-31 ENCOUNTER — Encounter: Payer: Self-pay | Admitting: Cardiology

## 2023-12-31 DIAGNOSIS — R600 Localized edema: Secondary | ICD-10-CM | POA: Insufficient documentation

## 2023-12-31 DIAGNOSIS — E669 Obesity, unspecified: Secondary | ICD-10-CM | POA: Insufficient documentation

## 2023-12-31 NOTE — Assessment & Plan Note (Signed)
 Most recent creatinine was 1.26.  Judicious use of contrast if indicated.

## 2023-12-31 NOTE — Assessment & Plan Note (Signed)
 Moderate, nonocclusive CAD by cath in 2023 with no active anginal symptoms. On atorvastatin  4 mg daily along with Toprol  100 mg and losartan  50 mg. On Xarelto , therefore not on aspirin .

## 2023-12-31 NOTE — Assessment & Plan Note (Signed)
 Chronic edema without significant symptoms. - Continue to monitor edema and manage conservatively with foot elevation and support stockings for long periods of sitting or standing/travel

## 2023-12-31 NOTE — Assessment & Plan Note (Signed)
 Paroxysmal but likely persistent paroxysmal A-fib with ablation in 2019 but at least 2 documented breakthrough spells requiring cardioversion in 2023 and 2024. He usually is aware of his breakthrough spells and has not had any recently.  Not since 2024.  Currently in normal sinus rhythm with normal echocardiogram findings. - Continue Xarelto  20 mg daily. - Continue metoprolol  100 mg daily. - Continue losartan  50 mg daily. - Follow up with electrophysiologist (Dr. Inocencio) next week.

## 2023-12-31 NOTE — Assessment & Plan Note (Signed)
 Resolved with most recent echo showing EF 60 to 65%. Continue beta-blocker and ARB as noted for hypertension. No longer requiring any diuretic.

## 2023-12-31 NOTE — Assessment & Plan Note (Signed)
 Blood pressure well-controlled. - Continue metoprolol  100 mg daily. - Continue losartan  50 mg daily.

## 2023-12-31 NOTE — Assessment & Plan Note (Signed)
 He is definitely interested in weight loss with pending insurance coverage for Zepbound. - Coordinated with clinical pharmacy team to assist with insurance coverage for Zepbound.

## 2024-01-04 ENCOUNTER — Encounter: Payer: Self-pay | Admitting: Cardiology

## 2024-01-04 ENCOUNTER — Ambulatory Visit: Attending: Cardiology | Admitting: Cardiology

## 2024-01-04 VITALS — BP 132/72 | HR 60 | Temp 97.8°F | Resp 16 | Ht 70.0 in | Wt 246.0 lb

## 2024-01-04 DIAGNOSIS — G4733 Obstructive sleep apnea (adult) (pediatric): Secondary | ICD-10-CM | POA: Diagnosis present

## 2024-01-04 DIAGNOSIS — I4819 Other persistent atrial fibrillation: Secondary | ICD-10-CM | POA: Diagnosis not present

## 2024-01-04 DIAGNOSIS — I10 Essential (primary) hypertension: Secondary | ICD-10-CM | POA: Diagnosis present

## 2024-01-04 DIAGNOSIS — D6869 Other thrombophilia: Secondary | ICD-10-CM | POA: Insufficient documentation

## 2024-01-04 NOTE — Patient Instructions (Signed)
 Medication Instructions:  Your physician recommends that you continue on your current medications as directed. Please refer to the Current Medication list given to you today.  *If you need a refill on your cardiac medications before your next appointment, please call your pharmacy*  Lab Work: None ordered.  If you have labs (blood work) drawn today and your tests are completely normal, you will receive your results only by: MyChart Message (if you have MyChart) OR A paper copy in the mail If you have any lab test that is abnormal or we need to change your treatment, we will call you to review the results.  Testing/Procedures: None ordered.   Follow-Up: At [facility], you and your health needs are our priority.  As part of our continuing mission to provide you with exceptional heart care, our providers are all part of one team.  This team includes your primary Cardiologist (physician) and Advanced Practice Providers or APPs (Physician Assistants and Nurse Practitioners) who all work together to provide you with the care you need, when you need it.  Your next appointment:   Follow up as needed with Dr Inocencio

## 2024-01-04 NOTE — Progress Notes (Signed)
  Electrophysiology Office Note:   Date:  01/04/2024  ID:  Bruce Yoder, DOB 1945/10/19, MRN [id number]  Primary Cardiologist: Alm Clay, MD Primary Heart Failure: None Electrophysiologist: Yatzari Jonsson Gladis Norton, MD      History of Present Illness:   Bruce Yoder is a 78 y.o. male with h/o coronary artery disease, CKD with unilateral kidney, hypertension, sleep apnea, atrial fibrillation seen today for routine electrophysiology followup.   He is post ablation in 2022.  He is unfortunately had recurrence of his atrial fibrillation.  He had cardioversion March 2024.  Discussed the use of AI scribe software for clinical note transcription with the patient, who gave verbal consent to proceed.  History of Present Illness Bruce Yoder is a 78 year old male with atrial fibrillation who presents for follow-up regarding his heart rhythm management.  He has not experienced any recent episodes of atrial fibrillation, with the last occurrence approximately a year and a half ago. During that time, he was also dealing with depression and anxiety, which coincided with the recurrence of atrial fibrillation. He underwent a successful cardioversion in [facility] and has remained stable since then.  He is interested in weight loss medications but has encountered difficulties with insurance coverage for these treatments.    he denies chest pain, palpitations, dyspnea, PND, orthopnea, nausea, vomiting, dizziness, syncope, edema, weight gain, or early satiety.   Review of systems complete and found to be negative unless listed in HPI.   EP Information / Studies Reviewed:    EKG is not ordered today. EKG from 12/28/2023 reviewed which showed sinus rhythm        Risk Assessment/Calculations:    CHA2DS2-VASc Score = 4   This indicates a 4.8% annual risk of stroke. The patient's score is based upon: CHF History: 0 HTN History: 1 Diabetes History: 0 Stroke History: 0 Vascular Disease History:  1 Age Score: 2 Gender Score: 0            Physical Exam:   VS:  BP 132/72 (BP Location: Left Arm, Patient Position: Sitting, Cuff Size: Large)   Pulse 60   Ht 5' 10 (1.778 m)   Wt 246 lb (111.6 kg)   SpO2 95%   BMI 35.30 kg/m    Wt Readings from Last 3 Encounters:  01/04/24 246 lb (111.6 kg)  12/28/23 248 lb (112.5 kg)  04/02/22 228 lb (103.4 kg)     GEN: Well nourished, well developed in no acute distress NECK: No JVD; No carotid bruits CARDIAC: Regular rate and rhythm, no murmurs, rubs, gallops RESPIRATORY:  Clear to auscultation without rales, wheezing or rhonchi  ABDOMEN: Soft, non-tender, non-distended EXTREMITIES:  No edema; No deformity   ASSESSMENT AND PLAN:    1.  Persistent atrial fibrillation: Post ablation 10/11/2020.  He has had recurrences of atrial fibrillation and had a cardioversion 18 months ago.  Despite this, he is quite happy with his control.  He does not feel like he needs to make any changes to his arrhythmia management.  He is happy to follow-up with his general cardiologist and be referred back if any further issues arise.  2.  Secondary hypercoagulable state: On Eliquis  3.  Hypertension: Well-controlled  4.  Obstructive sleep apnea: CPAP compliance encouraged  5.  CKD: A solitary kidney followed by nephrology    follow up with EP Team as needed   Signed, Ewelina Naves Gladis Norton, MD
